# Patient Record
Sex: Male | Born: 1950 | Race: White | Hispanic: No | Marital: Married | Smoking: Former smoker
Health system: Southern US, Community
[De-identification: ages and names within clinical notes are randomized; demographics above are authoritative.]

## PROBLEM LIST (undated history)

## (undated) DIAGNOSIS — I509 Heart failure, unspecified: Secondary | ICD-10-CM

## (undated) DIAGNOSIS — J9610 Chronic respiratory failure, unspecified whether with hypoxia or hypercapnia: Secondary | ICD-10-CM

## (undated) DIAGNOSIS — I272 Pulmonary hypertension, unspecified: Secondary | ICD-10-CM

## (undated) DIAGNOSIS — R0602 Shortness of breath: Secondary | ICD-10-CM

## (undated) DIAGNOSIS — R51 Headache: Secondary | ICD-10-CM

## (undated) DIAGNOSIS — K648 Other hemorrhoids: Secondary | ICD-10-CM

## (undated) DIAGNOSIS — J4489 Other specified chronic obstructive pulmonary disease: Secondary | ICD-10-CM

## (undated) DIAGNOSIS — C801 Malignant (primary) neoplasm, unspecified: Secondary | ICD-10-CM

## (undated) DIAGNOSIS — L408 Other psoriasis: Secondary | ICD-10-CM

## (undated) DIAGNOSIS — I739 Peripheral vascular disease, unspecified: Secondary | ICD-10-CM

## (undated) DIAGNOSIS — R0902 Hypoxemia: Secondary | ICD-10-CM

## (undated) DIAGNOSIS — J449 Chronic obstructive pulmonary disease, unspecified: Secondary | ICD-10-CM

## (undated) DIAGNOSIS — J8410 Pulmonary fibrosis, unspecified: Secondary | ICD-10-CM

## (undated) DIAGNOSIS — D696 Thrombocytopenia, unspecified: Secondary | ICD-10-CM

## (undated) DIAGNOSIS — K5790 Diverticulosis of intestine, part unspecified, without perforation or abscess without bleeding: Secondary | ICD-10-CM

## (undated) DIAGNOSIS — I2510 Atherosclerotic heart disease of native coronary artery without angina pectoris: Secondary | ICD-10-CM

## (undated) DIAGNOSIS — E785 Hyperlipidemia, unspecified: Secondary | ICD-10-CM

## (undated) DIAGNOSIS — K631 Perforation of intestine (nontraumatic): Secondary | ICD-10-CM

## (undated) DIAGNOSIS — I279 Pulmonary heart disease, unspecified: Secondary | ICD-10-CM

## (undated) DIAGNOSIS — I50812 Chronic right heart failure: Secondary | ICD-10-CM

## (undated) DIAGNOSIS — Z86718 Personal history of other venous thrombosis and embolism: Secondary | ICD-10-CM

## (undated) DIAGNOSIS — M069 Rheumatoid arthritis, unspecified: Secondary | ICD-10-CM

## (undated) HISTORY — DX: Chronic right heart failure: I50.812

## (undated) HISTORY — DX: Other hemorrhoids: K64.8

## (undated) HISTORY — DX: Rheumatoid arthritis, unspecified: M06.9

## (undated) HISTORY — DX: Diverticulosis of intestine, part unspecified, without perforation or abscess without bleeding: K57.90

## (undated) HISTORY — DX: Chronic respiratory failure, unspecified whether with hypoxia or hypercapnia: J96.10

## (undated) HISTORY — DX: Hypoxemia: R09.02

## (undated) HISTORY — DX: Personal history of other venous thrombosis and embolism: Z86.718

## (undated) HISTORY — DX: Thrombocytopenia, unspecified: D69.6

## (undated) HISTORY — DX: Hyperlipidemia, unspecified: E78.5

## (undated) HISTORY — DX: Peripheral vascular disease, unspecified: I73.9

## (undated) HISTORY — DX: Pulmonary heart disease, unspecified: I27.9

## (undated) HISTORY — PX: BACK SURGERY: SHX140

## (undated) HISTORY — DX: Atherosclerotic heart disease of native coronary artery without angina pectoris: I25.10

## (undated) HISTORY — DX: Other psoriasis: L40.8

## (undated) HISTORY — DX: Other specified chronic obstructive pulmonary disease: J44.89

## (undated) HISTORY — DX: Chronic obstructive pulmonary disease, unspecified: J44.9

## (undated) HISTORY — DX: Pulmonary hypertension, unspecified: I27.20

## (undated) HISTORY — PX: TONSILLECTOMY: SUR1361

## (undated) HISTORY — DX: Perforation of intestine (nontraumatic): K63.1

## (undated) HISTORY — DX: Pulmonary fibrosis, unspecified: J84.10

---

## 1997-08-25 ENCOUNTER — Emergency Department (HOSPITAL_COMMUNITY): Admission: RE | Admit: 1997-08-25 | Discharge: 1997-08-25 | Payer: Self-pay | Admitting: Family Medicine

## 1997-09-18 ENCOUNTER — Ambulatory Visit (HOSPITAL_COMMUNITY): Admission: RE | Admit: 1997-09-18 | Discharge: 1997-09-18 | Payer: Self-pay | Admitting: *Deleted

## 1997-11-08 ENCOUNTER — Observation Stay (HOSPITAL_COMMUNITY): Admission: RE | Admit: 1997-11-08 | Discharge: 1997-11-08 | Payer: Self-pay | Admitting: Neurosurgery

## 1998-05-15 ENCOUNTER — Ambulatory Visit (HOSPITAL_COMMUNITY): Admission: RE | Admit: 1998-05-15 | Discharge: 1998-05-15 | Payer: Self-pay | Admitting: *Deleted

## 1998-05-21 ENCOUNTER — Ambulatory Visit (HOSPITAL_COMMUNITY): Admission: RE | Admit: 1998-05-21 | Discharge: 1998-05-21 | Payer: Self-pay | Admitting: Neurosurgery

## 2000-06-07 ENCOUNTER — Inpatient Hospital Stay (HOSPITAL_COMMUNITY): Admission: EM | Admit: 2000-06-07 | Discharge: 2000-06-10 | Payer: Self-pay | Admitting: *Deleted

## 2000-06-09 ENCOUNTER — Encounter: Payer: Self-pay | Admitting: Internal Medicine

## 2001-04-20 HISTORY — PX: COLON SURGERY: SHX602

## 2001-10-18 ENCOUNTER — Encounter (INDEPENDENT_AMBULATORY_CARE_PROVIDER_SITE_OTHER): Payer: Self-pay | Admitting: Specialist

## 2001-10-18 ENCOUNTER — Encounter (INDEPENDENT_AMBULATORY_CARE_PROVIDER_SITE_OTHER): Payer: Self-pay | Admitting: *Deleted

## 2001-10-18 ENCOUNTER — Ambulatory Visit (HOSPITAL_COMMUNITY): Admission: RE | Admit: 2001-10-18 | Discharge: 2001-10-18 | Payer: Self-pay | Admitting: Internal Medicine

## 2001-10-18 ENCOUNTER — Encounter: Payer: Self-pay | Admitting: *Deleted

## 2001-10-18 DIAGNOSIS — K631 Perforation of intestine (nontraumatic): Secondary | ICD-10-CM

## 2001-10-18 HISTORY — DX: Perforation of intestine (nontraumatic): K63.1

## 2001-10-18 HISTORY — PX: OTHER SURGICAL HISTORY: SHX169

## 2001-10-19 ENCOUNTER — Inpatient Hospital Stay (HOSPITAL_COMMUNITY): Admission: EM | Admit: 2001-10-19 | Discharge: 2001-10-26 | Payer: Self-pay | Admitting: Emergency Medicine

## 2001-10-19 ENCOUNTER — Encounter: Payer: Self-pay | Admitting: *Deleted

## 2001-10-19 ENCOUNTER — Encounter: Payer: Self-pay | Admitting: General Surgery

## 2001-10-21 ENCOUNTER — Encounter: Payer: Self-pay | Admitting: Surgery

## 2001-10-22 ENCOUNTER — Encounter: Payer: Self-pay | Admitting: Surgery

## 2001-11-11 ENCOUNTER — Ambulatory Visit (HOSPITAL_COMMUNITY): Admission: RE | Admit: 2001-11-11 | Discharge: 2001-11-11 | Payer: Self-pay | Admitting: General Surgery

## 2001-11-11 ENCOUNTER — Encounter: Payer: Self-pay | Admitting: General Surgery

## 2002-01-16 ENCOUNTER — Ambulatory Visit (HOSPITAL_COMMUNITY): Admission: RE | Admit: 2002-01-16 | Discharge: 2002-01-16 | Payer: Self-pay | Admitting: Cardiology

## 2002-04-26 ENCOUNTER — Ambulatory Visit (HOSPITAL_COMMUNITY): Admission: RE | Admit: 2002-04-26 | Discharge: 2002-04-26 | Payer: Self-pay

## 2004-03-11 ENCOUNTER — Ambulatory Visit: Payer: Self-pay | Admitting: Critical Care Medicine

## 2004-05-09 ENCOUNTER — Ambulatory Visit: Payer: Self-pay | Admitting: Critical Care Medicine

## 2006-06-30 ENCOUNTER — Ambulatory Visit: Payer: Self-pay | Admitting: Vascular Surgery

## 2007-03-08 ENCOUNTER — Encounter: Admission: RE | Admit: 2007-03-08 | Discharge: 2007-03-08 | Payer: Self-pay | Admitting: Dermatology

## 2007-08-25 ENCOUNTER — Encounter: Payer: Self-pay | Admitting: Emergency Medicine

## 2008-01-17 ENCOUNTER — Ambulatory Visit: Payer: Self-pay | Admitting: Vascular Surgery

## 2008-03-01 ENCOUNTER — Encounter: Payer: Self-pay | Admitting: Emergency Medicine

## 2008-08-30 ENCOUNTER — Encounter: Payer: Self-pay | Admitting: Emergency Medicine

## 2008-09-05 ENCOUNTER — Encounter: Admission: RE | Admit: 2008-09-05 | Discharge: 2008-09-05 | Payer: Self-pay | Admitting: Orthopedic Surgery

## 2008-09-06 ENCOUNTER — Encounter (INDEPENDENT_AMBULATORY_CARE_PROVIDER_SITE_OTHER): Payer: Self-pay | Admitting: Orthopedic Surgery

## 2008-09-06 ENCOUNTER — Ambulatory Visit (HOSPITAL_BASED_OUTPATIENT_CLINIC_OR_DEPARTMENT_OTHER): Admission: RE | Admit: 2008-09-06 | Discharge: 2008-09-06 | Payer: Self-pay | Admitting: Orthopedic Surgery

## 2009-07-12 ENCOUNTER — Ambulatory Visit: Payer: Self-pay | Admitting: Vascular Surgery

## 2009-09-25 ENCOUNTER — Encounter: Payer: Self-pay | Admitting: Emergency Medicine

## 2009-09-27 ENCOUNTER — Encounter: Admission: RE | Admit: 2009-09-27 | Discharge: 2009-09-27 | Payer: Self-pay | Admitting: Cardiology

## 2009-09-27 ENCOUNTER — Encounter: Payer: Self-pay | Admitting: Emergency Medicine

## 2009-10-04 ENCOUNTER — Ambulatory Visit (HOSPITAL_COMMUNITY): Admission: RE | Admit: 2009-10-04 | Discharge: 2009-10-04 | Payer: Self-pay | Admitting: Cardiology

## 2009-10-08 ENCOUNTER — Ambulatory Visit: Payer: Self-pay | Admitting: Pulmonary Disease

## 2009-10-09 ENCOUNTER — Encounter (INDEPENDENT_AMBULATORY_CARE_PROVIDER_SITE_OTHER): Payer: Self-pay | Admitting: Cardiology

## 2009-10-09 ENCOUNTER — Inpatient Hospital Stay (HOSPITAL_COMMUNITY): Admission: EM | Admit: 2009-10-09 | Discharge: 2009-10-14 | Payer: Self-pay | Admitting: Emergency Medicine

## 2009-10-10 ENCOUNTER — Encounter (INDEPENDENT_AMBULATORY_CARE_PROVIDER_SITE_OTHER): Payer: Self-pay | Admitting: Cardiology

## 2009-10-10 ENCOUNTER — Ambulatory Visit: Payer: Self-pay | Admitting: Vascular Surgery

## 2009-10-15 ENCOUNTER — Ambulatory Visit: Payer: Self-pay | Admitting: Oncology

## 2009-10-22 ENCOUNTER — Encounter: Payer: Self-pay | Admitting: Emergency Medicine

## 2009-10-29 DIAGNOSIS — D696 Thrombocytopenia, unspecified: Secondary | ICD-10-CM | POA: Insufficient documentation

## 2009-10-29 DIAGNOSIS — R0902 Hypoxemia: Secondary | ICD-10-CM | POA: Insufficient documentation

## 2009-10-29 DIAGNOSIS — E785 Hyperlipidemia, unspecified: Secondary | ICD-10-CM

## 2009-10-29 DIAGNOSIS — I739 Peripheral vascular disease, unspecified: Secondary | ICD-10-CM

## 2009-10-29 DIAGNOSIS — L408 Other psoriasis: Secondary | ICD-10-CM

## 2009-10-29 DIAGNOSIS — M069 Rheumatoid arthritis, unspecified: Secondary | ICD-10-CM | POA: Insufficient documentation

## 2009-10-29 DIAGNOSIS — J9610 Chronic respiratory failure, unspecified whether with hypoxia or hypercapnia: Secondary | ICD-10-CM

## 2009-10-29 DIAGNOSIS — J8410 Pulmonary fibrosis, unspecified: Secondary | ICD-10-CM | POA: Insufficient documentation

## 2009-10-30 ENCOUNTER — Ambulatory Visit: Payer: Self-pay | Admitting: Emergency Medicine

## 2009-11-01 ENCOUNTER — Encounter: Payer: Self-pay | Admitting: Emergency Medicine

## 2009-11-04 LAB — CONVERTED CEMR LAB
ALT: 60 units/L — ABNORMAL HIGH (ref 0–53)
AST: 42 units/L — ABNORMAL HIGH (ref 0–37)
Albumin: 4 g/dL (ref 3.5–5.2)
Bilirubin, Direct: 0.2 mg/dL (ref 0.0–0.3)
Total Protein: 7.5 g/dL (ref 6.0–8.3)

## 2009-11-07 ENCOUNTER — Telehealth (INDEPENDENT_AMBULATORY_CARE_PROVIDER_SITE_OTHER): Payer: Self-pay | Admitting: *Deleted

## 2009-11-13 ENCOUNTER — Ambulatory Visit: Payer: Self-pay | Admitting: Emergency Medicine

## 2009-11-13 LAB — CBC WITH DIFFERENTIAL/PLATELET
BASO%: 0.2 % (ref 0.0–2.0)
Basophils Absolute: 0 10*3/uL (ref 0.0–0.1)
EOS%: 5.8 % (ref 0.0–7.0)
HCT: 46.4 % (ref 38.4–49.9)
LYMPH%: 10.2 % — ABNORMAL LOW (ref 14.0–49.0)
MCH: 31.3 pg (ref 27.2–33.4)
MCHC: 33.9 g/dL (ref 32.0–36.0)
MCV: 92.3 fL (ref 79.3–98.0)
MONO#: 0.6 10*3/uL (ref 0.1–0.9)
NEUT#: 10 10*3/uL — ABNORMAL HIGH (ref 1.5–6.5)
NEUT%: 79.2 % — ABNORMAL HIGH (ref 39.0–75.0)
RBC: 5.03 10*6/uL (ref 4.20–5.82)
RDW: 15.8 % — ABNORMAL HIGH (ref 11.0–14.6)
WBC: 12.7 10*3/uL — ABNORMAL HIGH (ref 4.0–10.3)

## 2009-11-13 LAB — MORPHOLOGY: PLT EST: ADEQUATE

## 2009-11-13 LAB — CHCC SMEAR

## 2009-11-14 ENCOUNTER — Ambulatory Visit: Payer: Self-pay | Admitting: Oncology

## 2009-11-14 ENCOUNTER — Telehealth (INDEPENDENT_AMBULATORY_CARE_PROVIDER_SITE_OTHER): Payer: Self-pay | Admitting: *Deleted

## 2009-11-15 ENCOUNTER — Encounter: Payer: Self-pay | Admitting: Emergency Medicine

## 2009-11-15 LAB — COMPREHENSIVE METABOLIC PANEL
ALT: 28 U/L (ref 0–53)
Alkaline Phosphatase: 79 U/L (ref 39–117)
CO2: 23 mEq/L (ref 19–32)
Chloride: 101 mEq/L (ref 96–112)
Creatinine, Ser: 0.9 mg/dL (ref 0.40–1.50)
Potassium: 4.1 mEq/L (ref 3.5–5.3)

## 2009-11-15 LAB — PROTEIN ELECTROPHORESIS, SERUM
Alpha-2-Globulin: 13.3 % — ABNORMAL HIGH (ref 7.1–11.8)
Beta Globulin: 5.4 % (ref 4.7–7.2)
Gamma Globulin: 15.2 % (ref 11.1–18.8)

## 2009-11-15 LAB — CONVERTED CEMR LAB
ALT: 30 units/L (ref 0–53)
AST: 32 units/L (ref 0–37)
Albumin: 3.5 g/dL (ref 3.5–5.2)
Alkaline Phosphatase: 82 units/L (ref 39–117)
Bilirubin, Direct: 0.1 mg/dL (ref 0.0–0.3)
Total Bilirubin: 0.8 mg/dL (ref 0.3–1.2)

## 2009-11-15 LAB — KAPPA/LAMBDA LIGHT CHAINS: Kappa:Lambda Ratio: 0.53 (ref 0.26–1.65)

## 2009-11-15 LAB — IRON AND TIBC: Iron: 164 ug/dL (ref 42–165)

## 2009-11-15 LAB — FERRITIN: Ferritin: 644 ng/mL — ABNORMAL HIGH (ref 22–322)

## 2009-11-19 ENCOUNTER — Ambulatory Visit: Payer: Self-pay | Admitting: Emergency Medicine

## 2009-11-19 LAB — HEMOCHROMATOSIS DNA-PCR(C282Y,H63D)

## 2009-11-20 ENCOUNTER — Telehealth (INDEPENDENT_AMBULATORY_CARE_PROVIDER_SITE_OTHER): Payer: Self-pay | Admitting: *Deleted

## 2009-11-20 ENCOUNTER — Telehealth: Payer: Self-pay | Admitting: Emergency Medicine

## 2009-11-26 ENCOUNTER — Telehealth: Payer: Self-pay | Admitting: Emergency Medicine

## 2009-11-29 ENCOUNTER — Encounter: Payer: Self-pay | Admitting: Emergency Medicine

## 2009-12-12 ENCOUNTER — Ambulatory Visit: Payer: Self-pay | Admitting: Emergency Medicine

## 2009-12-25 LAB — CONVERTED CEMR LAB
ALT: 23 units/L (ref 0–53)
AST: 28 units/L (ref 0–37)
Alkaline Phosphatase: 60 units/L (ref 39–117)
Total Bilirubin: 0.6 mg/dL (ref 0.3–1.2)
Total Protein: 6.6 g/dL (ref 6.0–8.3)

## 2010-01-08 ENCOUNTER — Encounter: Payer: Self-pay | Admitting: Emergency Medicine

## 2010-01-13 ENCOUNTER — Ambulatory Visit: Payer: Self-pay | Admitting: Emergency Medicine

## 2010-01-14 LAB — CONVERTED CEMR LAB
ALT: 25 units/L (ref 0–53)
Total Protein: 6.8 g/dL (ref 6.0–8.3)

## 2010-02-05 ENCOUNTER — Ambulatory Visit: Payer: Self-pay | Admitting: Emergency Medicine

## 2010-02-13 ENCOUNTER — Ambulatory Visit: Payer: Self-pay | Admitting: Emergency Medicine

## 2010-02-13 ENCOUNTER — Telehealth: Payer: Self-pay | Admitting: Emergency Medicine

## 2010-02-13 LAB — CONVERTED CEMR LAB
Alkaline Phosphatase: 51 units/L (ref 39–117)
Total Bilirubin: 0.8 mg/dL (ref 0.3–1.2)

## 2010-02-17 ENCOUNTER — Ambulatory Visit: Payer: Self-pay | Admitting: Emergency Medicine

## 2010-02-20 ENCOUNTER — Ambulatory Visit: Payer: Self-pay | Admitting: Emergency Medicine

## 2010-02-21 LAB — CONVERTED CEMR LAB
CO2: 32 meq/L (ref 19–32)
Creatinine, Ser: 1 mg/dL (ref 0.4–1.5)
Potassium: 4.3 meq/L (ref 3.5–5.1)

## 2010-03-07 ENCOUNTER — Ambulatory Visit: Payer: Self-pay | Admitting: Emergency Medicine

## 2010-03-07 LAB — CONVERTED CEMR LAB
ALT: 26 units/L (ref 0–53)
AST: 32 units/L (ref 0–37)
Albumin: 4 g/dL (ref 3.5–5.2)
Total Bilirubin: 0.9 mg/dL (ref 0.3–1.2)

## 2010-03-19 ENCOUNTER — Ambulatory Visit: Payer: Self-pay | Admitting: Vascular Surgery

## 2010-03-21 ENCOUNTER — Encounter: Payer: Self-pay | Admitting: Emergency Medicine

## 2010-04-07 ENCOUNTER — Ambulatory Visit: Payer: Self-pay | Admitting: Emergency Medicine

## 2010-04-11 LAB — CONVERTED CEMR LAB: Total Bilirubin: 0.8 mg/dL (ref 0.3–1.2)

## 2010-04-15 ENCOUNTER — Encounter: Payer: Self-pay | Admitting: Emergency Medicine

## 2010-04-16 ENCOUNTER — Ambulatory Visit: Payer: Self-pay | Admitting: Emergency Medicine

## 2010-04-16 ENCOUNTER — Ambulatory Visit
Admission: RE | Admit: 2010-04-16 | Discharge: 2010-04-16 | Payer: Self-pay | Source: Home / Self Care | Attending: Emergency Medicine | Admitting: Emergency Medicine

## 2010-04-16 DIAGNOSIS — R0609 Other forms of dyspnea: Secondary | ICD-10-CM | POA: Insufficient documentation

## 2010-04-16 DIAGNOSIS — R0989 Other specified symptoms and signs involving the circulatory and respiratory systems: Secondary | ICD-10-CM

## 2010-04-18 ENCOUNTER — Encounter: Payer: Self-pay | Admitting: Emergency Medicine

## 2010-04-20 DIAGNOSIS — I2510 Atherosclerotic heart disease of native coronary artery without angina pectoris: Secondary | ICD-10-CM

## 2010-04-20 HISTORY — DX: Atherosclerotic heart disease of native coronary artery without angina pectoris: I25.10

## 2010-04-20 HISTORY — PX: OTHER SURGICAL HISTORY: SHX169

## 2010-04-22 ENCOUNTER — Telehealth (INDEPENDENT_AMBULATORY_CARE_PROVIDER_SITE_OTHER): Payer: Self-pay | Admitting: *Deleted

## 2010-05-06 ENCOUNTER — Other Ambulatory Visit: Payer: Self-pay | Admitting: Emergency Medicine

## 2010-05-06 ENCOUNTER — Ambulatory Visit
Admission: RE | Admit: 2010-05-06 | Discharge: 2010-05-06 | Payer: Self-pay | Source: Home / Self Care | Attending: Emergency Medicine | Admitting: Emergency Medicine

## 2010-05-06 LAB — HEPATIC FUNCTION PANEL
ALT: 24 U/L (ref 0–53)
AST: 25 U/L (ref 0–37)
Albumin: 3.9 g/dL (ref 3.5–5.2)
Alkaline Phosphatase: 42 U/L (ref 39–117)
Bilirubin, Direct: 0.2 mg/dL (ref 0.0–0.3)
Total Bilirubin: 0.9 mg/dL (ref 0.3–1.2)
Total Protein: 6.7 g/dL (ref 6.0–8.3)

## 2010-05-08 ENCOUNTER — Telehealth (INDEPENDENT_AMBULATORY_CARE_PROVIDER_SITE_OTHER): Payer: Self-pay | Admitting: *Deleted

## 2010-05-12 ENCOUNTER — Encounter: Payer: Self-pay | Admitting: Emergency Medicine

## 2010-05-16 ENCOUNTER — Encounter: Payer: Self-pay | Admitting: Emergency Medicine

## 2010-05-19 ENCOUNTER — Ambulatory Visit
Admission: RE | Admit: 2010-05-19 | Discharge: 2010-05-19 | Payer: Self-pay | Source: Home / Self Care | Attending: Emergency Medicine | Admitting: Emergency Medicine

## 2010-05-22 NOTE — Progress Notes (Signed)
Summary: refills  Phone Note Call from Patient Call back at Home Phone [phone]   Caller: Patient Call For: byrum Reason for Call: Refill Medication Summary of Call: Requests refills on klor-con 20 MEQ and lasix 14m [facility]. Initial call taken by: JNetta Neat  May 08, 2010 12:55 PM  Follow-up for Phone Call        Rx refills sent to pharm.  Pt aware.  Follow-up by: LTilden Dome  May 08, 2010 3:00 PM    Prescriptions: KLOR-CON M20 20 MEQ CR-TABS (POTASSIUM CHLORIDE CRYS CR) 2 by mouth daily  #180 x 1   Entered by:   LTilden Dome  Authorized by:   RCollene GobbleMD   Signed by:   LTilden Domeon 05/08/2010   Method used:   Faxed to ...       [facility]Probation officer       [address] [place]  [address]      Ph: [phone]      Fax: [phone]  RxID::   [id number] 40 MG TABS (FUROSEMIDE) 1 by mouth two times a day  #180 x 1   Entered by:   LTilden Dome  Authorized by:   RCollene GobbleMD   Signed by:   LTilden Domeon 05/08/2010   Method used:   Faxed to ...       [facility] (Probation officer       [address] [place]  [address]      Ph: [phone]      Fax: [phone]  RxID:   [id number]

## 2010-05-22 NOTE — Progress Notes (Signed)
Summary: Myrtis Hopping PA - waiting approval/denial  Phone Note From Pharmacy Call back at [phone] EX [phone]   Caller: [facility] Call For: BYRUM  Summary of Call:  [place]. Initial call taken by: Gustavus Bryant,  November 20, 2009 9:28 AM  Follow-up for Phone Call        called Freda Munro back and was informed she has faxed over a request regarding getting a prior auth for pt's Letaris.  Pt states she has faxed this 3 times since [phone] and faxed again today.  Freda Munro states we can call [phone] to start the PA process.  Pt's ID # is [id number].  Will forward message to Keane Scrape J since she is doing prior auths this afternoon to see if she has seen any paperwork on this pt.  Jinny Blossom Reynolds LPN  November 20, 3252 2:05 PM   Additional Follow-up for Phone Call Additional follow up Details #1::        no PA's for this patient.  will speak with Cecille Rubin regarding this - perhaps it is already in RB's lookat. Parke Poisson CNA/MA  November 20, 2009 4:48 PM   PA form found in RB's lookat, already filled out dated yesterday.  faxed back to [place] script.  will wait for approval/denial.  [facility] for Freda Munro to inform her that this has been faxed back to the number on the form. Parke Poisson CNA/MA  November 20, 2009 4:58 PM     Additional Follow-up for Phone Call Additional follow up Details #2::    Freda Munro from [employer] to verify if pt has tried and failed Tracleer, Revatio, or Engineer, site. Also will need copy of right heart cath, and 6 min walk. Caller did not want pt's PA to be denied and sent for appeal process due to not trying the above medications first. Please advise if you would like to continue with PA or try pt on different medication. Thanks. Iran Planas CMA  November 27, 2009 11:17 AM   He has never been tried on Tracleer, which means that they will probably reject this. I could do appeal since letaris is only once daily and he would  not need as frequent LFT's. If [facility] is going to refuse, then I would have to order Tracleer instead, go through the entire process again with this med. Will either need to get this through appeal process or change to bosentan 62.79m two times a day RCollene GobbleMD  November 27, 2009 12:50 PM     Left message for SFreda Munrothat pt has had Right Heart Cath . Will call with update asap; if any questions or concerns please call and speak with LFrancesca Jewett(nurse for RLarimer.KClayborne DanaCMA  November 27, 2009 3:27 PM   Additional Follow-up for Phone Call Additional follow up Details #3:: Details for Additional Follow-up Action Taken: Spoke with SFreda Munroand I am faxing the right heart cath and echo for clinical review. she is aware pt has not tried or failed any other PAH meds. She will call if additional info is needed. Reports faxed to  [place]@ [phone] attn: SFreda Munro Wil await results.   LFrancesca JewettCTowne Centre Surgery Center LLC November 27, 2009 4:13 PM  received approval from [facility] stating that pt's letaris has been approved from 11-29-2009 thru 11-29-2010.  approval letter [facility] to [facility]and message forwarded back to her. JParke PoissonCNA/MA  November 29, 2009 3:21 PM  called [facility] and verified that approval for letaris has been done and they are aware and have this info Ova Freshwater  November 29, 2009 3:38 PM  Additional Follow-up by: Ova Freshwater,  November 29, 2009 3:38 PM

## 2010-05-22 NOTE — Progress Notes (Signed)
Summary: edema > increased lasix to 73m 2 tabs daily  Phone Note Call from Patient Call back at Home Phone ([phone]  Caller: Patient Call For: byrum Summary of Call: pt was seen recently. says dr byrum increased pt's lasix. pt states he still has edema in his legs "late afternoons". pls advise. [facility] [address] ([facility] dr [facility]rum calls in [facility]. note: pt just left our bldg.  Initial call taken by: KCooper Render CNA,  February 13, 2010 12:31 PM  Follow-up for Phone Call        LVip Surg Asc LLCLTilden Dome February 13, 2010 12:43 PM  Spoke with pt.  He was seen on 02/05/10- had some swelling in legs and was advised that he needed to inrease his lasix to 40 mg to two times a day x 3 days, and then resume med once daily.  He states that this only helped some and now legs are swollen just like they were before.  Denies any other complaints.  Will forward to doc of the day.  Pls advise thanks Follow-up by: LTilden Dome  February 13, 2010 3:57 PM  Additional Follow-up for Phone Call Additional follow up Details #1::        per CDY: ok to take lasix 412m2tabs daily until he can discuss w/ RB.  called spoke with patient, advised him of CDYs recs as stated above.  pt verbalized his understanding.  will forward msg to RBLyndhurstor review/further recs.  pt aware RB not in office until tomorrow afternoon. JeParke PoissonNA/MA  February 13, 2010 5:02 PM     Additional Follow-up for Phone Call Additional follow up Details #2::    Spoke w him today. he started lasix 4077mwo times a day yesterday. Asked him to continue this regimen and get BMP next week in order to confirm no effects on renal fxn. Will adjust further based on lab results. RobCollene Gobble  February 14, 2010 5:42 PM   order placed for BMP. JenRandolph BingA  February 17, 2010 9:06 AM

## 2010-05-22 NOTE — Miscellaneous (Signed)
Summary: Orders Update pft charges  Clinical Lists Changes  Orders: Added new Service order of Carbon Monoxide diffusing w/capacity (94720) - Signed Added new Service order of Lung Volumes (94240) - Signed Added new Service order of Spirometry (Pre & Post) (94060) - Signed 

## 2010-05-22 NOTE — Progress Notes (Signed)
Summary: prior auth correction  [id number]  Phone Note From Pharmacy Call back at [phone]   Caller: sheila[facility] Call For: [name]  Summary of Call: States some items on the prior autho needs corrected. Initial call taken by: Netta Neat,  November 26, 2009 3:14 PM  Follow-up for Phone Call        [facility] for [name] from [place].  Derek Blossom Reynolds LPN  November 27, 4799 3:22 PM   Additional Follow-up for Phone Call Additional follow up Details #1::        See phone note from 11/20/2009. will close this phone note and finish in previous. Iran Planas CMA  November 27, 2009 11:14 AM

## 2010-05-22 NOTE — Letter (Signed)
Summary: [facility]   Imported By: Phillis Knack 12/04/2009 11:56:22  _____________________________________________________________________  External Attachment:    Type:   Image     Comment:   External Document

## 2010-05-22 NOTE — Assessment & Plan Note (Signed)
Summary: 6 min walk  Nurse Visit   Vital Signs:  Patient profile:   60 year old male Pulse rate:   89 / minute BP sitting:   114 / 66  O2 Flow:  4  Allergies: No Known Drug Allergies  Orders Added: 1)  Pulmonary Stress (6 min walk) [94620]   Six Minute Walk Test Medications taken before test(dose and time): lasix, prednisone, pantoprazole, spironolactone, klor con, pletal, coumadin, letaris, mucinex, tylenol @ 0930 Supplemental oxygen during the test: Yes Flow: 4 Type: Continuous  Lap counter(place a tick mark inside a square for each lap completed) lap 1 complete  lap 2 complete   lap 3 complete    Baseline  BP sitting: 114/ 66 Heart rate: 89 Dyspnea ( Borg scale) 0 Fatigue (Borg scale) 0.5 SPO2 94 4L  End Of Test   Heart rate: 127 Dyspnea ( Borg scale) 9 Fatigue (Borg scale) 0.5 SPO2 76 4L  2 Minutes post  BP sitting: 122/ 64 Heart rate: 89 SPO2 94 6L  Stopped or paused before six minutes? Yes Reason: d/t DOE, checked pt's o2 sats at 2:18sec.  sats = 76% on 4L cont, HR = 127.  had patient to sit down.  increased o2 to 6L cont and encouraged deep breathing.  at 44mnutes left, pt's o2 had increased 77% 6L.    Interpretation: Number of laps  3 X 48 meters =   144 meters+ final partial lap: 24 meters =    168 meters   Total distance walked in six minutes: 168 meters  Tech ID: JParke PoissonCNA/MA (April 16, 2010 2:38 PM) Tech Comments pt was unable to complete test d/t DOE and sats decreasing down to 76% 4L.  had increased pt's o2 liter flow to 6L and encouraged deep breathing.  at 071mutes left in the test, pt's sats had recovered to 77%-80% on 6L.  at 2 minutes post, pt's sats had recovered to 94% 6L.  discussed w/ RB on whether to continue walking pt on increased liter flow, or to leave test as is.  per RB, okay to leave test/results "as is."

## 2010-05-22 NOTE — Medication Information (Signed)
Summary: [facility]   Imported By: Bubba Hales 04/30/2010 10:33:03  _____________________________________________________________________  External Attachment:    Type:   Image     Comment:   External Document

## 2010-05-22 NOTE — Assessment & Plan Note (Signed)
Summary: PAH, ILD, COPD, hypoxemia   Visit Type:  Follow-up Primary Provider/Referring Provider:  Dr. Deland Pretty  CC:  PAH...COPD.Marland KitchenMarland KitchenCXR and 6MW today.Marland KitchenMarland KitchenEcho done 03/25/2009.Marland KitchenMarland Kitchenpt c/o incr SOB w/ exertion.  History of Present Illness: 60 yo man, former smoker with COPD, hx psoriatic arthritis and associated ILD, profound hypoxemia and associated Pulm HTN (PAP 57/26 mean 37, PAOP 14/11 mean 7 by cath 10/04/09. 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 Admitted 6/21-27 with resp failure and decompensated R heart failure. He was diuresed, treated with corticosteroids and started on bronchodilators for his COPD. O2 was titrated to 5L/min at rest, he is leaving it on 5 with exertion.   ROV 01/13/10 -- 59yo, Hx psoriatic arthritis, ILD and R heart failure, hypoxemia, PAH, on chronic pred 66m once daily. Also hx COPD. Started Letaris at last visit after LFT's normalized. He describes today a slow decline in exertional tolerance since hosp 6/21-27. Has been increasing oximizer to 4L/min with exertion. Having thick sputum, hard to get up, white/tan. Worst in the evening. No wheezing. No CP. Edema has been stable, he gets some at the end of the day, then resolves by am. He doesn't notice any difference in his breathing on the letaris.   ROV 02/05/10 -- 59yo, PAH with Hx psoriatic arthritis, ILD and R heart failure, hypoxemia, on chronic pred 153monce daily. Also hx COPD.  On letaris since September '11. Since last time breathing may be a bit better. He has good days and bad days. Rarely uses ventolin. No HA, no syncope or near syncope. LE edema has been well controlled and stable. Was desaturated while walking today on presentation. Has been gaining some wt, aldactone was decreased by Dr SoElisabeth Cara month ago. PFTs done today.   ROV 04/16/10 -- Hx as above, follows up after TTE and 6 min walk. Returns feeling a bit more SOB w exertion. 6 min walk distance = 16865mssoc with significant desat to 75% (after 2  min). Estimated RVSP 36m23mup from 59 in 09/2009). Has been on letaris since September. Edema seems better on lasix 40mg19m times a day. Clearly both TTE and walk distance are worse.   Current Medications (verified): 1)  Furosemide 40 Mg Tabs (Furosemide) .... 1Marland KitchenBy Mouth Two Times A Day 2)  Multivitamins  Tabs (Multiple Vitamin) .... Take 1 Tablet By Mouth Once A Day 3)  Prednisone 10 Mg Tabs (Prednisone) .... 1Marland KitchenBy Mouth Daily 4)  Protonix 40 Mg Tbec (Pantoprazole Sodium) .... Take 1 Tablet By Mouth Once A Day 5)  Spironolactone 25 Mg Tabs (Spironolactone) .... Take 1/2 Tablet By Mouth Two Times A Day 6)  Spiriva Handihaler 18 Mcg  Caps (Tiotropium Bromide Monohydrate) .... Two Puffs in Handihaler Daily 7)  Pletal 100 Mg Tabs (Cilostazol) .... Take 1 Tablet By Mouth Two Times A Day 8)  Humira 40 Mg/0.8ml K52m(Adalimumab) .... Every 2 Weeks 9)  Thiamine Hcl 100 Mg Tabs (Thiamine Hcl) .... 1 Marland Kitcheny Mouth Daily 10)  Klor-Con M20 20 Meq Cr-Tabs (Potassium Chloride Crys Cr) .... 2 By Mouth Daily 11)  Coumadin 3 Mg Tabs (Warfarin Sodium) .... As Directed 12)  Folic Acid 1 Mg Tabs (Folic Acid) .... 1 Marland Kitcheny Mouth Daily 13)  Letairis 5 Mg Tabs (Ambrisentan) .... 1 Marland Kitcheny Mouth Daily 14)  Tylenol Arthritis Pain 650 Mg Cr-Tabs (Acetaminophen) .... 2 By Mouth Two Times A Day 15)  Ventolin Hfa 108 (90 Base) Mcg/act Aers (Albuterol Sulfate) .... 2 Puffs  Q4h As Needed Sob 16)  Simvastatin 20 Mg Tabs (Simvastatin) .Marland Kitchen.. 1 Once Daily 17)  Mucinex 600 Mg Xr12h-Tab (Guaifenesin) .Marland Kitchen.. 1 Once Daily  Allergies (verified): No Known Drug Allergies  Vital Signs:  Patient profile:   60 year old male Height:      67 inches (170.18 cm) Weight:      178.38 pounds (81.08 kg) BMI:     28.04 O2 Sat:      95 % on 4 L/min Temp:     97.8 degrees F (36.56 degrees C) oral Pulse rate:   88 / minute BP sitting:   120 / 78  (right arm) Cuff size:   regular  Vitals Entered By: Francesca Jewett CMA (April 16, 2010 3:03 PM)  O2 Sat  at Rest %:  95 O2 Flow:  4 L/min CC: PAH...COPD.Marland KitchenMarland KitchenCXR and 6MW today.Marland KitchenMarland KitchenEcho done 03/25/2009.Marland KitchenMarland Kitchenpt c/o incr SOB w/ exertion Comments Medications reviewed with patient Francesca Jewett CMA  April 16, 2010 3:03 PM   Physical Exam  General:  normal appearance and healthy appearing, hyperpigmented skin Head:  normocephalic and atraumatic Eyes:  conjunctiva and sclera clear Nose:  scar on tip of nose, no other lesions Mouth:  no deformity or lesions Neck:  no masses, thyromegaly, or abnormal cervical nodes Lungs:  bilateral basilar insp crackles, L>R, no wheezes Heart:  regular, S1, S2 + S4 Abdomen:  not examined Extremities:  1+ LE edema Neurologic:  non-focal Skin:  some mild psoriatic changes both calves Psych:  alert and cooperative; normal mood and affect; normal attention span and concentration   Impression & Recommendations:  Problem # 1:  PULMONARY HYPERTENSION, 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 1 by mouth two times a day 2)  Klor-con M20 20 Meq Cr-tabs (Potassium chloride crys cr) .... 2 by mouth daily  Patient Instructions: 1)  Continue your Spiriva 2)  Continue your oxygen 3)  Continue your Letaris. We will start the process to get you on Tyvaso through [facility] 4)  We will look at your insurance and find out the preferred centers to refer you for lung transplant evaluation 5)  Follow up with Dr Lamonte Sakai in 1 month   Immunization History:  Influenza Immunization History:    Influenza:  historical (01/18/2010)

## 2010-05-22 NOTE — Letter (Signed)
Summary: [facility]   Imported By: Bubba Hales 11/22/2009 13:12:14  _____________________________________________________________________  External Attachment:    Type:   Image     Comment:   External Document

## 2010-05-22 NOTE — Progress Notes (Signed)
Summary: labs  Phone Note Call from Patient Call back at Home Phone [phone]   Caller: Patient Call For: byrum Summary of Call: pt wants to speak to nurse re: "changing labs to be done". wants to move this to 8/25. Initial call taken by: Cooper Render, CNA,  November 20, 2009 12:05 PM  Follow-up for Phone Call        I advised pt it is okay for him to come in early. I will leave original appointment. Iran Planas CMA  November 20, 2009 12:27 PM

## 2010-05-22 NOTE — Assessment & Plan Note (Signed)
Summary: pulm HTN   Visit Type:  Hospital Follow-up Primary Provider/Referring Provider:  Dr. Deland Pretty  CC:  HFU. The patient has no complaints today.Marland Kitchen  History of Present Illness: 60 yo man, former smoker with COPD, hx psoriatic arthritis and associated ILD, profound hypoxemia and associated Pulm HTN (PAP 57/26 mean 37, PAOP 14/11 mean 7 by cath 10/04/09. 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 Admitted 6/21-27 with resp failure and decompensated R heart failure. He was diuresed, treated with corticosteroids and started on bronchodilators for his COPD. O2 was titrated to 5L/min at rest, he is leaving it on 5 with exetion. He presents now post-hosp to discuss his lung disease and his pulm HTN.   He tells me that his breathing is improved since hospitalization. He is limited by the short duration of his O2 tanks on high flows [facility]). Was started on Spiriva, seems to be tolerating well. His LE edema is markedly improved, Spironolactone added on d/c. Tapering Pred, currently on 13m once daily, his prior mainatance dose was 154monce daily. Psoriasis and rheumatoid disease under good control for the last 2 yrs. No snoring, no witnessed apneas.   Current Medications (verified): 1)  Furosemide 40 Mg Tabs (Furosemide) .... Take 1 Tablet By Mouth Once A Day 2)  Multivitamins  Tabs (Multiple Vitamin) .... Take 1 Tablet By Mouth Once A Day 3)  Prednisone 10 Mg Tabs (Prednisone) .... 205maily 4)  Protonix 40 Mg Tbec (Pantoprazole Sodium) .... Take 1 Tablet By Mouth Once A Day 5)  Spironolactone 25 Mg Tabs (Spironolactone) .... Take 1 Tablet By Mouth Two Times A Day 6)  Spiriva Handihaler 18 Mcg  Caps (Tiotropium Bromide Monohydrate) .... Two Puffs in Handihaler Daily 7)  Pletal 100 Mg Tabs (Cilostazol) .... Take 1 Tablet By Mouth Two Times A Day 8)  Humira 40 Mg/0.8ml70mt (Adalimumab) .... Every 2 Weeks 9)  Thiamine Hcl 100 Mg Tabs (Thiamine Hcl) .... Marland Kitchen By Mouth Daily 10)   Klor-Con M20 20 Meq Cr-Tabs (Potassium Chloride Crys Cr) .... Marland Kitchen By Mouth Daily 11)  Coumadin 3 Mg Tabs (Warfarin Sodium) .... As Directed 12)  Folic Acid 1 Mg Tabs (Folic Acid) .... Marland Kitchen By Mouth Daily  Allergies (verified): No Known Drug Allergies  Vital Signs:  Patient profile:   59 y12r old male Height:      67 inches (170.18 cm) Weight:      162 pounds (73.64 kg) BMI:     25.46 O2 Sat:      92 % on 5 L/min Temp:     98.3 degrees F (36.83 degrees C) oral Pulse rate:   96 / minute BP sitting:   120 / 68  (right arm) Cuff size:   regular  Vitals Entered By: LoriFrancesca Jewett (October 30, 2009 4:13 PM)  O2 Sat at Rest %:  92 O2 Flow:  5 L/min CC: HFU. The patient has no complaints today. Comments Medications reviewed. Daytime phone verified. LoriFrancesca Jewett  October 30, 2009 4:15 PM   Physical Exam  General:  normal appearance and healthy appearing, hyperpigmented skin Head:  normocephalic and atraumatic Eyes:  conjunctiva and sclera clear Nose:  scar on tip of nose, no other lesions Mouth:  no deformity or lesions Neck:  no masses, thyromegaly, or abnormal cervical nodes Lungs:  bilateral basilar insp crackles, L>R, no wheezes Heart:  regular, S1, S2 + S4 Abdomen:  not examined Extremities:  trace pretibial edema, markedly  improved from hospitalization Neurologic:  non-focal Skin:  some mild psoriatic changes both calves Psych:  alert and cooperative; normal mood and affect; normal attention span and concentration   Impression & Recommendations:  Problem # 1:  PULMONARY HYPERTENSION, 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) - need to get oximizer, insure that he is adequately oxygenated - diuretics as ordered - treat underlying conditions - consider w/u OSA in the future - will start process to get Letaris  Problem # 2:  C O P D (ICD-496) - Spiiva  Problem # 3:  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 231mdaily 2)  Thiamine Hcl 100 Mg Tabs (Thiamine hcl) ...Marland Kitchen 1 by mouth daily 3)  Klor-con M20 20 Meq Cr-tabs (Potassium chloride crys cr) ...Marland Kitchen 1 by mouth daily 4)  Coumadin 3 Mg Tabs (Warfarin sodium) .... As directed 5)  Folic Acid 1 Mg Tabs (Folic acid) ...Marland Kitchen 1 by mouth daily  Other Orders: Pulmonary Referral (Pulmonary) DME Referral (DME) Consultation Level IV (9(20233TLB-Hepatic/Liver Function Pnl (80076-HEPATIC)  Patient Instructions: 1)  We will decrease your prednisone to 1040my mouth once daily  2)  Continue your Spiriva once daily  3)  Continue your oxygen. We will ask your company to get an oximizer and to titrate your oxygen when you walk.  4)  Stay on your diuretics and other medications as ordered by Dr SolElisabeth Cara  Continue your coumadin.  6)  We will start the process to obtain Letaris, a medication to treat pulmonary hypertension.  7)  Follow up with Dr ByrLamonte Sakai 2 weeks.

## 2010-05-22 NOTE — Medication Information (Signed)
Summary: Let[facility]is/[facility]  [facility]   Imported By: Bubba Hales 12/11/2009 07:55:14  _____________________________________________________________________  External Attachment:    Type:   Image     Comment:   External Document

## 2010-05-22 NOTE — Assessment & Plan Note (Signed)
Summary: PAH, COPD, ILD   Visit Type:  Follow-up Primary Provider/Referring Provider:  Dr. Deland Pretty  CC:  PAH...COPD...patient c/o increased SOB with any exertion...unable to get any mucus up and out with his cough.  History of Present Illness: 61 yo man, former smoker with COPD, hx psoriatic arthritis and associated ILD, profound hypoxemia and associated Pulm HTN (PAP 57/26 mean 37, PAOP 14/11 mean 7 by cath 10/04/09. 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 Admitted 6/21-27 with resp failure and decompensated R heart failure. He was diuresed, treated with corticosteroids and started on bronchodilators for his COPD. O2 was titrated to 5L/min at rest, he is leaving it on 5 with exetion. He presents now post-hosp to discuss his lung disease and his pulm HTN.   He tells me that his breathing is improved since hospitalization. He is limited by the short duration of his O2 tanks on high flows [facility]). Was started on Spiriva, seems to be tolerating well. His LE edema is markedly improved, Spironolactone added on d/c. Tapering Pred, currently on 40m once daily, his prior mainatance dose was 161monce daily. Psoriasis and rheumatoid disease under good control for the last 2 yrs. No snoring, no witnessed apneas.   ROV 11/19/09 -- returns for f/u Pulm HTN in setting of psoriatic arthritis, ILD, hypoxemia. last time we discussed starting Letaris. I held the med because he has slight transaminitis. His repeat LFT's since last time had normalized. He is c/o nasal sores and clots from high flow O2, asking for a mask instead of [place] or oximizer to use sometimes to give his nose a break. Down to his baseline Pred 1078mnce daily.   ROV 01/13/10 -- 59yo, Hx psoriatic arthritis, ILD and R heart failure, hypoxemia, PAH, on chronic pred 72m40mce daily. Also hx COPD. Started Letaris at last visit after LFT's normalized. He describes today a slow decline in exertional tolerance since hosp 6/21-27.  Has been increasing oximizer to 4L/min with exertion. Having thick sputum, hard to get up, white/tan. Worst in the evening. No wheezing. No CP. Edema has been stable, he gets some at the end of the day, then resolves by am. He doesn't notice any difference in his breathing on the letaris.   Current Medications (verified): 1)  Furosemide 40 Mg Tabs (Furosemide) .... Take 1 Tablet By Mouth Once A Day 2)  Multivitamins  Tabs (Multiple Vitamin) .... Take 1 Tablet By Mouth Once A Day 3)  Prednisone 10 Mg Tabs (Prednisone) .... Marland Kitchen By Mouth Daily 4)  Protonix 40 Mg Tbec (Pantoprazole Sodium) .... Take 1 Tablet By Mouth Once A Day 5)  Spironolactone 25 Mg Tabs (Spironolactone) .... Take 1/2 Tablet By Mouth Two Times A Day 6)  Spiriva Handihaler 18 Mcg  Caps (Tiotropium Bromide Monohydrate) .... Two Puffs in Handihaler Daily 7)  Pletal 100 Mg Tabs (Cilostazol) .... Take 1 Tablet By Mouth Two Times A Day 8)  Humira 40 Mg/0.8ml 41m (Adalimumab) .... Every 2 Weeks 9)  Thiamine Hcl 100 Mg Tabs (Thiamine Hcl) .... 1Marland KitchenBy Mouth Daily 10)  Klor-Con M20 20 Meq Cr-Tabs (Potassium Chloride Crys Cr) .... 1Marland KitchenBy Mouth Daily 11)  Coumadin 3 Mg Tabs (Warfarin Sodium) .... As Directed 12)  Folic Acid 1 Mg Tabs (Folic Acid) .... 1Marland KitchenBy Mouth Daily 13)  Letairis 5 Mg Tabs (Ambrisentan) .... 1Marland KitchenBy Mouth Daily 14)  Tylenol Arthritis Pain 650 Mg Cr-Tabs (Acetaminophen) .... 2 By Mouth Two Times A Day  Allergies (verified): No Known Drug Allergies  Vital Signs:  Patient profile:   60 year old male Height:      67 inches (170.18 cm) Weight:      170 pounds (77.27 kg) BMI:     26.72 O2 Sat:      86 % on 3 L/min with oximizer Temp:     98.2 degrees F (36.78 degrees C) oral Pulse rate:   102 / minute BP sitting:   118 / 80  (right arm) Cuff size:   regular  Vitals Entered By: Francesca Jewett CMA (January 13, 2010 2:24 PM)  O2 Sat at Rest %:  86 O2 Flow:  3 L/min with oximizer  O2 Sat Comments Sats were 86% on 3 liters  with oximizer upon entering the exam room. After resting for approx 3 minutes his sats came up to 90% on 3 liters with oximizer.Francesca Jewett CMA  January 13, 2010 2:28 PM  Physical Exam  General:  normal appearance and healthy appearing, hyperpigmented skin Head:  normocephalic and atraumatic Eyes:  conjunctiva and sclera clear Nose:  scar on tip of nose, no other lesions Mouth:  no deformity or lesions Neck:  no masses, thyromegaly, or abnormal cervical nodes Lungs:  bilateral basilar insp crackles, L>R, no wheezes Heart:  regular, S1, S2 + S4 Abdomen:  not examined Extremities:  trace pretibial edema, markedly improved from hospitalization Neurologic:  non-focal Skin:  some mild psoriatic changes both calves Psych:  alert and cooperative; normal mood and affect; normal attention span and concentration   Impression & Recommendations:  Problem # 1:  PULMONARY HYPERTENSION, 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)  contoinue letaris for now, may be too early to derive clinically benefit check 6 moin walk and TTE in several mo O2 as ordered  Orders: Est. Patient Level IV (18299)  Problem # 2:  C O P D (ICD-496) Spiriva add ventolin as needed mucinex as needed  full pfts next time  Problem # 3:  PULMONARY FIBROSIS (ICD-515)  Problem # 4:  PSORIASIS (ICD-696.1)  Medications Added to Medication List This Visit: 1)  Prednisone 10 Mg Tabs (Prednisone) .Marland Kitchen.. 1 by mouth daily 2)  Spironolactone 25 Mg Tabs (Spironolactone) .... Take 1/2 tablet by mouth two times a day 3)  Letairis 5 Mg Tabs (Ambrisentan) .Marland Kitchen.. 1 by mouth daily 4)  Tylenol Arthritis Pain 650 Mg Cr-tabs (Acetaminophen) .... 2 by mouth two times a day 5)  Ventolin Hfa 108 (90 Base) Mcg/act Aers (Albuterol sulfate) .... 2 puffs q4h as needed sob 6)  Ventolin Hfa 108 (90 Base) Mcg/act Aers (Albuterol sulfate) .... 2 puffs q4h as needed sob  Patient Instructions: 1)  Continue your Letaris 29m by mouth once daily  2)  We will notify you  about your labwork results from today when they are available.  3)  Continue your oxygen at 2L/min at rest, 4L/min with exertion.  4)  We will start mucinex once daily as needed (over the counter) 5)  Continue your Spiriva once daily  6)  We will start Ventolin 2 puffs up to every 4 hours if needed for shortness of breath.  7)  Continue your Prednisone as ordered.  8)  We will perform full pulmonary function testing at the time of your next appointment.  9)  We will repeat your echocardiogram and 6 minute walk in several months.  10)  Follow up with Dr BLamonte Sakaiin 1 month or as needed.  Prescriptions: VENTOLIN HFA 108 (90 BASE) MCG/ACT AERS (ALBUTEROL  SULFATE) 2 puffs q4h as needed sob  #3 x 3   Entered and Authorized by:   Collene Gobble MD   Signed by:   Collene Gobble MD on 01/13/2010   Method used:   Printed then faxed to ...       [facility] [id number] (retail)       [address], [place]  [address]       Ph: [phone]       Fax: [phone]   RxID:   [id number] VENTOLIN HFA 108 (90 BASE) MCG/ACT AERS (ALBUTEROL SULFATE) 2 puffs q4h as needed sob  #1 x 2   Entered and Authorized by:   Collene Gobble MD   Signed by:   Collene Gobble MD on 01/13/2010   Method used:   Electronically to        [phone] (retail)       [address]       [place], [place]  [address]       Ph: [phone]       Fax: [phone]   RxID:   [id number]

## 2010-05-22 NOTE — Assessment & Plan Note (Signed)
Summary: PAH, ILD, psoriatic arthritis, COPD   Visit Type:  Follow-up Primary Provider/Referring Provider:  Dr. Deland Pretty  CC:  Patient is here for 2 week follow-up. He states his breathing is no better or worse. He does c/o nose bleeds with oxygen use and is requesting a face mask, instead of, and the nasal cannula. The patient has not started Letaris.Marland Kitchen  History of Present Illness: 60 yo man, former smoker with COPD, hx psoriatic arthritis and associated ILD, profound hypoxemia and associated Pulm HTN (PAP 57/26 mean 37, PAOP 14/11 mean 7 by cath 10/04/09. 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 Admitted 6/21-27 with resp failure and decompensated R heart failure. He was diuresed, treated with corticosteroids and started on bronchodilators for his COPD. O2 was titrated to 5L/min at rest, he is leaving it on 5 with exetion. He presents now post-hosp to discuss his lung disease and his pulm HTN.   He tells me that his breathing is improved since hospitalization. He is limited by the short duration of his O2 tanks on high flows [facility]). Was started on Spiriva, seems to be tolerating well. His LE edema is markedly improved, Spironolactone added on d/c. Tapering Pred, currently on 60m once daily, his prior mainatance dose was 125monce daily. Psoriasis and rheumatoid disease under good control for the last 2 yrs. No snoring, no witnessed apneas.   ROV 11/19/09 -- returns for f/u Pulm HTN in setting of psoriatic arthritis, ILD, hypoxemia. last time we discussed starting Letaris. I held the med because he has slight transaminitis. His repeat LFT's since last time had normalized. He is c/o nasal sores and clots from high flow O2, asking for a mask instead of [place] or oximizer to use sometimes to give his nose a break. Down to his baseline Pred 1066mnce daily.   Current Medications (verified): 1)  Furosemide 40 Mg Tabs (Furosemide) .... Take 1 Tablet By Mouth Once A Day 2)   Multivitamins  Tabs (Multiple Vitamin) .... Take 1 Tablet By Mouth Once A Day 3)  Prednisone 10 Mg Tabs (Prednisone) .... 68m10mily 4)  Protonix 40 Mg Tbec (Pantoprazole Sodium) .... Take 1 Tablet By Mouth Once A Day 5)  Spironolactone 25 Mg Tabs (Spironolactone) .... Take 1 Tablet By Mouth Two Times A Day 6)  Spiriva Handihaler 18 Mcg  Caps (Tiotropium Bromide Monohydrate) .... Two Puffs in Handihaler Daily 7)  Pletal 100 Mg Tabs (Cilostazol) .... Take 1 Tablet By Mouth Two Times A Day 8)  Humira 40 Mg/0.8ml 85m (Adalimumab) .... Every 2 Weeks 9)  Thiamine Hcl 100 Mg Tabs (Thiamine Hcl) .... 1Marland KitchenBy Mouth Daily 10)  Klor-Con M20 20 Meq Cr-Tabs (Potassium Chloride Crys Cr) .... 1Marland KitchenBy Mouth Daily 11)  Coumadin 3 Mg Tabs (Warfarin Sodium) .... As Directed 12)  Folic Acid 1 Mg Tabs (Folic Acid) .... 1Marland KitchenBy Mouth Daily  Allergies (verified): No Known Drug Allergies  Vital Signs:  Patient profile:   59 ye73 old male Height:      67 inches (170.18 cm) Weight:      167 pounds (75.91 kg) BMI:     26.25 O2 Sat:      91 % on 5 L/min Temp:     97.6 degrees F (36.44 degrees C) oral Pulse rate:   96 / minute BP sitting:   112 / 74  (right arm) Cuff size:   regular  Vitals Entered By: [name] Francesca Jewett(November 19, 2009 3:40 PM)  O2 Sat at Rest %:  91 O2 Flow:  5 L/min CC: Patient is here for 2 week follow-up. He states his breathing is no better or worse. He does c/o nose bleeds with oxygen use and is requesting a face mask, instead of, the nasal cannula. The patient has not started Letaris. Comments Medications reviewed. Daytime phone verified. Francesca Jewett CMA  November 19, 2009 3:41 PM   Physical Exam  General:  normal appearance and healthy appearing, hyperpigmented skin Head:  normocephalic and atraumatic Eyes:  conjunctiva and sclera clear Nose:  scar on tip of nose, no other lesions Mouth:  no deformity or lesions Neck:  no masses, thyromegaly, or abnormal cervical nodes Lungs:  bilateral  basilar insp crackles, L>R, no wheezes Heart:  regular, S1, S2 + S4 Abdomen:  not examined Extremities:  trace pretibial edema, markedly improved from hospitalization Neurologic:  non-focal Skin:  some mild psoriatic changes both calves Psych:  alert and cooperative; normal mood and affect; normal attention span and concentration   Impression & Recommendations:  Problem # 1:  PULMONARY HYPERTENSION, 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)  Orders: Est. Patient Level IV (74944) DME Referral (DME) Pulmonary Referral (Pulmonary)  Problem # 2:  PULMONARY FIBROSIS (ICD-515)  Problem # 3:  PSORIASIS (ICD-696.1)  Problem # 4:  C O P D (ICD-496)  Problem # 5:  HYPOXEMIA (ICD-799.02)  Patient Instructions: 1)  We will get you an oxygen mask to use at home 2)  We will convert you to oximizer, 4L/min at rest and 5L/min with exertion 3)  We will reinitiate your Letaris prescription now that your liver testing has normalized. You will need LFT's every month 4)  Continue your coumadin 5)  Continue Spiriva once daily  6)  Continue Prednisone 14m once daily  7)  Follow with Dr BLamonte Sakaiin 6 weeks

## 2010-05-22 NOTE — Medication Information (Signed)
Summary: LEAP Program/[facility]  LEAP Program/[facility]   Imported By: Bubba Hales 11/15/2009 08:57:53  _____________________________________________________________________  External Attachment:    Type:   Image     Comment:   External Document

## 2010-05-22 NOTE — Procedures (Signed)
Summary: Oximtery Titration/[facility]  Oximtery Titration/[facility]   Imported By: Bubba Hales 11/22/2009 13:09:36  _____________________________________________________________________  External Attachment:    Type:   Image     Comment:   External Document

## 2010-05-22 NOTE — Progress Notes (Signed)
Summary: Letairis shipment  Phone Note From Other Clinic   Caller: zanetta with [facility] Call For: dr. Lamonte Sakai Summary of Call: Farrel Gordon phoned stated that she spoke to Mr. Pieroni regarding his Milda Smart and he informed her that this medication has been discontinued. Please advise Zanetta at [phone]  Initial call taken by: Ozella Rocks,  April 22, 2010 2:09 PM  Follow-up for Phone Call        The patient has been informed that he will continue on Letairis and the medication has not been discontinued. He also understands that Tyvaso is being added to his treatment therapy for PAH. [facility] does not open until 9am and I will call back at that time so they know to ship Letairis to the patient. Follow-up by: Francesca Jewett CMA,  April 23, 2010 8:49 AM  Additional Follow-up for Phone Call Additional follow up Details #1::        I spoke with Dorisann Frames at [facility] and she is aware that the patient will continue Letairis and also start Tyvaso. They understand that the patient was confused and that he also now understands that he will be taking two medications for PAH. They will ship Letairis to the patient. Additional Follow-up by: Francesca Jewett CMA,  April 23, 2010 9:31 AM

## 2010-05-22 NOTE — Letter (Signed)
Summary: [facility]  [facility]   Imported By: Phillis Knack 11/05/2009 12:13:24  _____________________________________________________________________  External Attachment:    Type:   Image     Comment:   External Document

## 2010-05-22 NOTE — Assessment & Plan Note (Signed)
Summary: PAH, RA/ILD, COPD, hypoxemia   Visit Type:  Follow-up Primary Provider/Referring Provider:  Dr. Deland Pretty  CC:  1 month followup PAH and COPD.  Pt states that his breathing is the same- no better or worse.  He states that chest congestion is some better with mucinex- has little cough that is prod with brown sputum.[name]  History of Present Illness: 60 yo man, former smoker with COPD, hx psoriatic arthritis and associated ILD, profound hypoxemia and associated Pulm HTN (PAP 57/26 mean 37, PAOP 14/11 mean 7 by cath 10/04/09. 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 Admitted 6/21-27 with resp failure and decompensated R heart failure. He was diuresed, treated with corticosteroids and started on bronchodilators for his COPD. O2 was titrated to 5L/min at rest, he is leaving it on 5 with exertion.   ROV 11/19/09 -- returns for f/u Pulm HTN in setting of psoriatic arthritis, ILD, hypoxemia. last time we discussed starting Letaris. I held the med because he has slight transaminitis. His repeat LFT's since last time had normalized. He is c/o nasal sores and clots from high flow O2, asking for a mask instead of [place] or oximizer to use sometimes to give his nose a break. Down to his baseline Pred 9m once daily.   ROV 01/13/10 -- 59yo, Hx psoriatic arthritis, ILD and R heart failure, hypoxemia, PAH, on chronic pred 120monce daily. Also hx COPD. Started Letaris at last visit after LFT's normalized. He describes today a slow decline in exertional tolerance since hosp 6/21-27. Has been increasing oximizer to 4L/min with exertion. Having thick sputum, hard to get up, white/tan. Worst in the evening. No wheezing. No CP. Edema has been stable, he gets some at the end of the day, then resolves by am. He doesn't notice any difference in his breathing on the letaris.   ROV 02/05/10 -- 59yo, PAH with Hx psoriatic arthritis, ILD and R heart failure, hypoxemia, on chronic pred 1065mnce daily. Also hx COPD.   Since last time breathing may be a bit better. He has good days and bad days. Rarely uses ventolin. No HA, no syncope or near syncope. LE edema has been well controlled and stable. Was desaturated while walking today on presentation. Has been gaining some wt, aldactone was decreased by Dr SolElisabeth Caramonth ago. PFTs done today.   Current Medications (verified): 1)  Furosemide 40 Mg Tabs (Furosemide) .... Take 1 Tablet By Mouth Once A Day 2)  Multivitamins  Tabs (Multiple Vitamin) .... Take 1 Tablet By Mouth Once A Day 3)  Prednisone 10 Mg Tabs (Prednisone) ....Derek Kitchen1 By Mouth Daily 4)  Protonix 40 Mg Tbec (Pantoprazole Sodium) .... Take 1 Tablet By Mouth Once A Day 5)  Spironolactone 25 Mg Tabs (Spironolactone) .... Take 1/2 Tablet By Mouth Two Times A Day 6)  Spiriva Handihaler 18 Mcg  Caps (Tiotropium Bromide Monohydrate) .... Two Puffs in Handihaler Daily 7)  Pletal 100 Mg Tabs (Cilostazol) .... Take 1 Tablet By Mouth Two Times A Day 8)  Humira 40 Mg/0.8ml28mt (Adalimumab) .... Every 2 Weeks 9)  Thiamine Hcl 100 Mg Tabs (Thiamine Hcl) .... [name] By Mouth Daily 10)  Klor-Con M20 20 Meq Cr-Tabs (Potassium Chloride Crys Cr) .... [name] By Mouth Daily 11)  Coumadin 3 Mg Tabs (Warfarin Sodium) .... As Directed 12)  Folic Acid 1 Mg Tabs (Folic Acid) .... [name] By Mouth Daily 13)  Letairis 5 Mg Tabs (Ambrisentan) .... [name] By Mouth Daily 14)  Tylenol Arthritis Pain 650 Mg Cr-Tabs (Acetaminophen) .... 2 By Mouth Two Times A Day 15)  Ventolin Hfa 108 (90 Base) Mcg/act Aers (Albuterol Sulfate) .... 2 Puffs Q4h As Needed Sob 16)  Simvastatin 20 Mg Tabs (Simvastatin) .[name].. 1 Once Daily 17)  Mucinex 600 Mg Xr12h-Tab (Guaifenesin) .[name].. 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    Physical Exam  General:  normal appearance and healthy appearing, hyperpigmented skin Head:  normocephalic and atraumatic Eyes:  conjunctiva and sclera clear Nose:  scar on tip of nose, no other lesions Mouth:  no deformity or lesions Neck:  no masses, thyromegaly, or abnormal cervical nodes Lungs:  bilateral basilar insp crackles, L>R, no wheezes Heart:  regular, S1, S2 + S4 Abdomen:  not examined Extremities:  increased LE edema compared w last time. 1+ pitting edema.  Neurologic:  non-focal Skin:  some mild psoriatic changes both calves Psych:  alert and cooperative; normal mood and affect; normal attention span and concentration   Pulmonary Function Test Date: 02/05/2010 Height (in.): 67 Gender: Male  Pre-Spirometry FVC    Value: 3.44 L/min   Pred: 4.15 L/min     % Pred: 83 % FEV1    Value: 2.56 L     Pred: 2.95 L     % Pred: 87 % FEV1/FVC  Value: 75 %     Pred: 71 %     % Pred: - % FEF 25-75  Value: 1.96 L/min   Pred: 2.92 L/min     % Pred: 67 %  Post-Spirometry FVC    Value: 3.46 L/min   Pred: 4.15 L/min     % Pred: 83 % FEV1    Value: 2.60 L     Pred: 2.95 L     % Pred: 88 % FEV1/FVC  Value: 75 %     Pred: 71 %     % Pred: - % FEF 25-75  Value: 2.09 L/min   Pred: 2.92 L/min     % Pred: 72 %  Lung Volumes TLC    Value: 5.43 L   % Pred: 83 % RV    Value: 1.99 L   %  Pred: 91 % DLCO    Value: 5.8 %   % Pred: 26 % DLCO/VA  Value: 1.37 %   % Pred: 37 %  Comments: Arlyce Harman suggests mixed obstruction and restriction. Volumes in normal range. Severely decreased DLCO. RSB  Impression & Recommendations:  Problem # 1:  PULMONARY HYPERTENSION, 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) Surprisingly little evidence for restriction or COPD on PFTs, although pt's with both can sometimes show  pseudonormalization of spirometry and volumes. The DLCO is very low, consistent w pts hypoxemia.  Letaris 5 for now, reassess TTE and 6 min walk in December increase lasix temporarily x 3 days oximizer consider increasing letaris or adding second agent next time.  Orders: Est. 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 1 once daily 2)  Mucinex 600 Mg Xr12h-tab (Guaifenesin) .[name].. 1 once daily  Patient Instructions: 1)  Oxygen at 2.5L/min at rest and 5L/min with exertion.  2)  Increase your lasix to 71m two times a day for the next 3 days then go back to once daily  3)  Continue Letaris 568monce daily  4)  Continue Spiriva once daily  5)  Continue ProAir as needed  6)  We will perform a 6 minute walk and an echocardiogram in December. 7)  Follow up with Dr ByLamonte Sakaio review your results in December.

## 2010-05-22 NOTE — Progress Notes (Signed)
Summary: wants rx for new mask  Phone Note Call from Patient Call back at Home Phone [phone]   Caller: Patient Call For: byrum Reason for Call: Talk to Nurse, Talk to Doctor Summary of Call: patient is using oxygen, he is having problems with his nose bleeding from the tube. and he is just getting dried out beacuse of it. he would like a rx for a tube that covers his nose and his mouth. he uses [place] [facility].  he wants to know if this would be safe for him and he just wants a nurses opinion for this. Initial call taken by: Adin Hector,  November 07, 2009 4:48 PM  Follow-up for Phone Call        called spoke with patient who states that he uses 5L/min continuous oxygen.  using the nasal cannula his nose becomes very dry occ causing nose bleeds and requests a facial mask.  pt states that he does have a humidifier on his concentrator and does not want to try the nasal saline gel.  RB is not in the office today.  will forward to doc of the day, but pt is aware that may not get a call back today. Parke Poisson CNA/MA  November 07, 2009 5:07 PM   per CDY, will forward to [place]. Parke Poisson CNA/MA  November 07, 2009 5:16 PM   Will discuss with him at [facility] MD  November 15, 2009 11:42 AM  Follow-up by: Collene Gobble MD,  November 15, 2009 11:42 AM

## 2010-05-22 NOTE — Progress Notes (Signed)
Summary: [facility] calling  Phone Note From Pharmacy   Caller: brandon w/ [facility] Call For: byrum  Summary of Call: caller wants verbal order for PANTOPRAZOLE and PREDNISONe (apparently having trouble w/  orders for this). ref # [id number] Initial call taken by: Cooper Render, CNA,  November 14, 2009 3:46 PM  Follow-up for Phone Call        Spoke with pharmacist at [facility] and gave verbal okay to refill pantoprazole and prednisone,90 days supply with 3 rf.  Follow-up by: Tilden Dome,  November 14, 2009 4:01 PM    Prescriptions: PROTONIX 40 MG TBEC (PANTOPRAZOLE SODIUM) Take 1 tablet by mouth once a day  #90 x 3   Entered by:   Tilden Dome   Authorized by:   Collene Gobble MD   Signed by:   Tilden Dome on 11/14/2009   Method used:   Telephoned to ...       [facility] Fax) (mail-order)             ,          Ph: [phone]       Fax: [phone]   RxID:   [id number] PREDNISONE 10 MG TABS (PREDNISONE) 44m daily  #180 x 3   Entered by:   LTilden Dome  Authorized by:   RCollene GobbleMD   Signed by:   LTilden Domeon 11/14/2009   Method used:   Telephoned to ...       [facility]             ,          Ph: [phone]      Fax: [phone]  RxID:   [id number]

## 2010-05-22 NOTE — Procedures (Signed)
[facility]: [facility]   Imported By: Phillis Knack 01/23/2010 08:27:31  _____________________________________________________________________  External Attachment:    Type:   Image     Comment:   External Document

## 2010-05-22 NOTE — Miscellaneous (Signed)
Summary: Orders Update  Clinical Lists Changes  Orders: Added new Test order of T-2 View CXR (71020TC) - Signed 

## 2010-05-22 NOTE — Letter (Signed)
Summary: [facility]  [address], [place] [address]   Phone: [phone]  Fax: [phone]    11/15/2009  [address], [place]  [address]  Dear Mr. Taffe,  Here are your dates for recurring lab appointments:  12/13/2009 01/13/2010 02/10/2010 03/10/2010 04/07/2010 05/05/2009 06/02/2009 06/29/2009 08/06/2009 08/24/2009 09/21/2009 10/19/2009 11/13/2009  Please make sure you have your labs done each and every month. If you have any questions, please call our office at [phone].        Sincerely,   Francesca Jewett CMA

## 2010-05-23 NOTE — Medication Information (Signed)
Summary: [facility]  [facility]   Imported By: Bubba Hales 01/13/2010 10:30:40  _____________________________________________________________________  External Attachment:    Type:   Image     Comment:   External Document

## 2010-05-28 NOTE — Assessment & Plan Note (Signed)
Summary: PAH, ILD, COPD   Visit Type:  Follow-up Primary Provider/Referring Provider:  Dr. Deland Pretty  CC:  PAH.  On Tyvaso x1 month and no changes in his breathing.Marland KitchenMarland KitchenPt c/o edema in lower extremities every evening.  History of Present Illness: 60 yo man, former smoker with COPD, hx psoriatic arthritis and associated ILD, profound hypoxemia and associated Pulm HTN (PAP 57/26 mean 37, PAOP 14/11 mean 7 by cath 10/04/09. 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 Admitted 6/21-27 with resp failure and decompensated R heart failure. He was diuresed, treated with corticosteroids and started on bronchodilators for his COPD. O2 was titrated to 5L/min at rest, he is leaving it on 5 with exertion.   ROV 02/05/10 -- 59yo, PAH with Hx psoriatic arthritis, ILD and R heart failure, hypoxemia, on chronic pred 61m once daily. Also hx COPD.  On letaris since September '11. Since last time breathing may be a bit better. He has good days and bad days. Rarely uses ventolin. No HA, no syncope or near syncope. LE edema has been well controlled and stable. Was desaturated while walking today on presentation. Has been gaining some wt, aldactone was decreased by Dr SElisabeth Caraa month ago. PFTs done today.   ROV 04/16/10 -- Hx as above, follows up after TTE and 6 min walk. Returns feeling a bit more SOB w exertion. 6 min walk distance = 1657massoc with significant desat to 75% (after 2 min). Estimated RVSP 7969m(up from 59 in 09/2009). Has been on letaris since September. Edema seems better on lasix 31m29mo times a day. Clearly both TTE and walk distance are worse.   ROV 05/19/10 -- returns for f/u of the above issues. last time we started Tyvaso in addition to his letaris. Has been on it for a month. Up to 9 puffs 4x a day. Has been assoc with some HA, occas cough right after doing it, not intrusive. On Pred 10mg63miriva + ventoilin as needed. He is not noticing any big changes in his breathing yet on the Tyvaso.  Monthly LFT's normal 1/17.   Current Medications (verified): 1)  Furosemide 40 Mg Tabs (Furosemide) .... 1Marland KitchenBy Mouth Two Times A Day 2)  Multivitamins  Tabs (Multiple Vitamin) .... Take 1 Tablet By Mouth Once A Day 3)  Prednisone 10 Mg Tabs (Prednisone) .... 1Marland KitchenBy Mouth Daily 4)  Protonix 40 Mg Tbec (Pantoprazole Sodium) .... Take 1 Tablet By Mouth Once A Day 5)  Spironolactone 25 Mg Tabs (Spironolactone) .... Take 1/2 Tablet By Mouth Two Times A Day 6)  Spiriva Handihaler 18 Mcg  Caps (Tiotropium Bromide Monohydrate) .... Two Puffs in Handihaler Daily 7)  Pletal 100 Mg Tabs (Cilostazol) .... Take 1 Tablet By Mouth Two Times A Day 8)  Humira 40 Mg/0.8ml K52m(Adalimumab) .... Every 2 Weeks 9)  Thiamine Hcl 100 Mg Tabs (Thiamine Hcl) .... 1 Marland Kitcheny Mouth Daily 10)  Klor-Con M20 20 Meq Cr-Tabs (Potassium Chloride Crys Cr) .... 2 By Mouth Daily 11)  Coumadin 3 Mg Tabs (Warfarin Sodium) .... As Directed 12)  Folic Acid 1 Mg Tabs (Folic Acid) .... 1 Marland Kitcheny Mouth Daily 13)  Letairis 5 Mg Tabs (Ambrisentan) .... 1 Marland Kitcheny Mouth Daily 14)  Tylenol Arthritis Pain 650 Mg Cr-Tabs (Acetaminophen) .... 2 By Mouth Two Times A Day 15)  Ventolin Hfa 108 (90 Base) Mcg/act Aers (Albuterol Sulfate) .... 2 Puffs Q4h As Needed Sob 16)  Simvastatin 20 Mg Tabs (Simvastatin) .... 1 Marland Kitchennce  Daily 17)  Mucinex 600 Mg Xr12h-Tab (Guaifenesin) .Marland Kitchen.. 1 Once Daily 18)  Tyvaso 0.6 Mg/ml Soln (Treprostinil) .... 9 Puffs Four Times Daily  Allergies (verified): No Known Drug Allergies  Vital Signs:  Patient profile:   60 year old male Height:      67 inches (170.18 cm) Weight:      183.25 pounds (83.30 kg) BMI:     28.80 O2 Sat:      90 % on 4 L/min Temp:     97.6 degrees F (36.44 degrees C) oral Pulse rate:   93 / minute BP sitting:   118 / 62  (left arm) Cuff size:   regular  Vitals Entered By: Francesca Jewett CMA (May 19, 2010 10:35 AM)  O2 Sat at Rest %:  90 O2 Flow:  4 L/min CC: PAH.  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 Patient Level IV (66060)  Problem # 2:  C O P D (ICD-496)  Problem # 3:  PULMONARY FIBROSIS (ICD-515)  Medications Added to Medication List This Visit: 1)  Tyvaso 0.6 Mg/ml Soln (Treprostinil) .... 9 puffs four times daily  Patient Instructions: 1)  Please continue your Letaris once daily 2)  We will check your liver tests every month 3)  Continue your Tyvaso 9 puffs 4 times a day 4)  Continue your prednisone 40m by mouth once daily  5)  Continue coumadin 6)  [facility]  Continue your Spiriva once daily.  8)  Follow up with Dr BLamonte Sakaiin 2 months or as needed

## 2010-05-28 NOTE — Miscellaneous (Signed)
Summary: [facility]ification [facility] Plan of [facility] / [place] [facility]   Imported By: Rise Patience 05/19/2010 14:18:56  _____________________________________________________________________  External Attachment:    Type:   Image     Comment:   External Document

## 2010-06-02 ENCOUNTER — Encounter (INDEPENDENT_AMBULATORY_CARE_PROVIDER_SITE_OTHER): Payer: Self-pay | Admitting: *Deleted

## 2010-06-02 ENCOUNTER — Other Ambulatory Visit: Payer: Self-pay

## 2010-06-02 ENCOUNTER — Other Ambulatory Visit: Payer: Self-pay | Admitting: Emergency Medicine

## 2010-06-02 DIAGNOSIS — I2789 Other specified pulmonary heart diseases: Secondary | ICD-10-CM

## 2010-06-02 LAB — HEPATIC FUNCTION PANEL
AST: 28 U/L (ref 0–37)
Bilirubin, Direct: 0.2 mg/dL (ref 0.0–0.3)

## 2010-06-16 ENCOUNTER — Telehealth (INDEPENDENT_AMBULATORY_CARE_PROVIDER_SITE_OTHER): Payer: Self-pay | Admitting: *Deleted

## 2010-06-17 NOTE — Consult Note (Signed)
Summary: [facility]  [facility]   Imported By: Phillis Knack 06/13/2010 09:18:02  _____________________________________________________________________  External Attachment:    Type:   Image     Comment:   External Document

## 2010-06-23 ENCOUNTER — Encounter: Payer: Self-pay | Admitting: Emergency Medicine

## 2010-06-26 NOTE — Progress Notes (Signed)
Summary: having heart cath 3/9 and wants to know when he stop fluid pills  Phone Note Call from Patient   Caller: Patient Call For: [place] of Call: Patient phoned stated that he is scheduled for a heart cath at [facility] on Friday March 9th for pre lung transplant testing and he wants to know when he should stop the fluid pills. Patient can be reached at [phone] Initial call taken by: Ozella Rocks,  June 16, 2010 4:22 PM  Follow-up for Phone Call        Patient is scheduled for a heart cath at [facility] on March 9th and was told he would need to "HOLD" Furosemide before and was instructed to contact the prescribing doctor for instructions on when and how to do this. Pls advise.Francesca Jewett [facility]  June 16, 2010 5:23 PM  Please ask him to hold lasix after last dose on 3/7 (do not take any on 3/8 or 3/9). Thanks Collene Gobble MD  June 17, 2010 2:48 PM  Follow-up by: Collene Gobble MD,  June 17, 2010 2:48 PM  Additional Follow-up for Phone Call Additional follow up Details #1::        Spoke with pt and notified of the above recs per RB and he verbalized understanding. Additional Follow-up by: Tilden Dome,  June 17, 2010 2:55 PM

## 2010-06-30 ENCOUNTER — Other Ambulatory Visit: Payer: Self-pay | Admitting: Emergency Medicine

## 2010-06-30 ENCOUNTER — Other Ambulatory Visit: Payer: BC Managed Care – PPO

## 2010-06-30 ENCOUNTER — Encounter (INDEPENDENT_AMBULATORY_CARE_PROVIDER_SITE_OTHER): Payer: Self-pay | Admitting: *Deleted

## 2010-06-30 DIAGNOSIS — I2789 Other specified pulmonary heart diseases: Secondary | ICD-10-CM

## 2010-06-30 LAB — HEPATIC FUNCTION PANEL
ALT: 35 U/L (ref 0–53)
AST: 32 U/L (ref 0–37)
Albumin: 4 g/dL (ref 3.5–5.2)

## 2010-07-06 LAB — MAGNESIUM: Magnesium: 2 mg/dL (ref 1.5–2.5)

## 2010-07-06 LAB — GLUCOSE, CAPILLARY
Glucose-Capillary: 149 mg/dL — ABNORMAL HIGH (ref 70–99)
Glucose-Capillary: 150 mg/dL — ABNORMAL HIGH (ref 70–99)
Glucose-Capillary: 170 mg/dL — ABNORMAL HIGH (ref 70–99)
Glucose-Capillary: 175 mg/dL — ABNORMAL HIGH (ref 70–99)
Glucose-Capillary: 176 mg/dL — ABNORMAL HIGH (ref 70–99)
Glucose-Capillary: 221 mg/dL — ABNORMAL HIGH (ref 70–99)
Glucose-Capillary: 237 mg/dL — ABNORMAL HIGH (ref 70–99)
Glucose-Capillary: 248 mg/dL — ABNORMAL HIGH (ref 70–99)
Glucose-Capillary: 311 mg/dL — ABNORMAL HIGH (ref 70–99)
Glucose-Capillary: 98 mg/dL (ref 70–99)

## 2010-07-06 LAB — BASIC METABOLIC PANEL
BUN: 15 mg/dL (ref 6–23)
BUN: 18 mg/dL (ref 6–23)
CO2: 30 mEq/L (ref 19–32)
CO2: 32 mEq/L (ref 19–32)
Calcium: 8.2 mg/dL — ABNORMAL LOW (ref 8.4–10.5)
Calcium: 8.6 mg/dL (ref 8.4–10.5)
Chloride: 101 mEq/L (ref 96–112)
Chloride: 96 mEq/L (ref 96–112)
Creatinine, Ser: 1.04 mg/dL (ref 0.4–1.5)
Creatinine, Ser: 1.05 mg/dL (ref 0.4–1.5)
GFR calc Af Amer: >60 mL/min (ref >60)
GFR calc Af Amer: >60 mL/min (ref >60)
GFR calc Af Amer: >60 mL/min (ref >60)
GFR calc non Af Amer: >60 mL/min (ref >60)
GFR calc non Af Amer: >60 mL/min (ref >60)
GFR calc non Af Amer: >60 mL/min (ref >60)
GFR calc non Af Amer: >60 mL/min (ref >60)
Glucose, Bld: 121 mg/dL — ABNORMAL HIGH (ref 70–99)
Glucose, Bld: 140 mg/dL — ABNORMAL HIGH (ref 70–99)
Glucose, Bld: 167 mg/dL — ABNORMAL HIGH (ref 70–99)
Glucose, Bld: 81 mg/dL (ref 70–99)
Potassium: 3.2 mEq/L — ABNORMAL LOW (ref 3.5–5.1)
Potassium: 3.8 mEq/L (ref 3.5–5.1)
Potassium: 4 mEq/L (ref 3.5–5.1)
Sodium: 135 mEq/L (ref 135–145)
Sodium: 137 mEq/L (ref 135–145)
Sodium: 137 mEq/L (ref 135–145)

## 2010-07-06 LAB — URINALYSIS, MICROSCOPIC ONLY
Bilirubin Urine: NEGATIVE
Glucose, UA: NEGATIVE mg/dL
Ketones, ur: NEGATIVE mg/dL
Protein, ur: NEGATIVE mg/dL
pH: 5.5 (ref 5.0–8.0)

## 2010-07-06 LAB — CBC
HCT: 45.6 % (ref 39.0–52.0)
HCT: 46.9 % (ref 39.0–52.0)
HCT: 50.6 % (ref 39.0–52.0)
Hemoglobin: 14.6 g/dL (ref 13.0–17.0)
Hemoglobin: 15.1 g/dL (ref 13.0–17.0)
Hemoglobin: 15.4 g/dL (ref 13.0–17.0)
Hemoglobin: 16.2 g/dL (ref 13.0–17.0)
Hemoglobin: 16.8 g/dL (ref 13.0–17.0)
Hemoglobin: 16.9 g/dL (ref 13.0–17.0)
MCH: 32.1 pg (ref 26.0–34.0)
MCH: 32.2 pg (ref 26.0–34.0)
MCH: 32.6 pg (ref 26.0–34.0)
MCHC: 32.5 g/dL (ref 30.0–36.0)
MCHC: 32.9 g/dL (ref 30.0–36.0)
MCHC: 33 g/dL (ref 30.0–36.0)
MCHC: 33.3 g/dL (ref 30.0–36.0)
MCHC: 33.4 g/dL (ref 30.0–36.0)
MCV: 98.3 fL (ref 78.0–100.0)
MCV: 98.6 fL (ref 78.0–100.0)
MCV: 98.8 fL (ref 78.0–100.0)
Platelets: 105 10*3/uL — ABNORMAL LOW (ref 150–400)
Platelets: 105 10*3/uL — ABNORMAL LOW (ref 150–400)
Platelets: 114 10*3/uL — ABNORMAL LOW (ref 150–400)
RBC: 4.55 MIL/uL (ref 4.22–5.81)
RBC: 4.97 MIL/uL (ref 4.22–5.81)
RBC: 5.15 MIL/uL (ref 4.22–5.81)
RBC: 5.16 MIL/uL (ref 4.22–5.81)
RDW: 14.9 % (ref 11.5–15.5)
RDW: 15 % (ref 11.5–15.5)
RDW: 15.1 % (ref 11.5–15.5)
RDW: 15.6 % — ABNORMAL HIGH (ref 11.5–15.5)
RDW: 15.6 % — ABNORMAL HIGH (ref 11.5–15.5)
WBC: 11.4 10*3/uL — ABNORMAL HIGH (ref 4.0–10.5)
WBC: 12.1 10*3/uL — ABNORMAL HIGH (ref 4.0–10.5)

## 2010-07-06 LAB — DIFFERENTIAL
Basophils Absolute: 0 10*3/uL (ref 0.0–0.1)
Basophils Relative: 0 % (ref 0–1)
Eosinophils Absolute: 0.3 10*3/uL (ref 0.0–0.7)
Eosinophils Relative: 0 % (ref 0–5)
Eosinophils Relative: 3 % (ref 0–5)
Lymphocytes Relative: 3 % — ABNORMAL LOW (ref 12–46)
Lymphs Abs: 1.7 10*3/uL (ref 0.7–4.0)
Monocytes Absolute: 0.2 10*3/uL (ref 0.1–1.0)
Monocytes Absolute: 1.2 10*3/uL — ABNORMAL HIGH (ref 0.1–1.0)
Monocytes Relative: 10 % (ref 3–12)
Monocytes Relative: 2 % — ABNORMAL LOW (ref 3–12)
Neutro Abs: 10.8 10*3/uL — ABNORMAL HIGH (ref 1.7–7.7)

## 2010-07-06 LAB — BLOOD GAS, ARTERIAL
Acid-Base Excess: 6.2 mmol/L — ABNORMAL HIGH (ref 0.0–2.0)
Bicarbonate: 29.6 mEq/L — ABNORMAL HIGH (ref 20.0–24.0)
Bicarbonate: 31.4 mEq/L — ABNORMAL HIGH (ref 20.0–24.0)
FIO2: 100 %
O2 Saturation: 88.8 %
O2 Saturation: 98.9 %
Patient temperature: 98.6
TCO2: 30.9 mmol/L (ref 0–100)
TCO2: 31.8 mmol/L (ref 0–100)
pCO2 arterial: 45.7 mmHg — ABNORMAL HIGH (ref 35.0–45.0)
pO2, Arterial: 131 mmHg — ABNORMAL HIGH (ref 80.0–100.0)
pO2, Arterial: 55.4 mmHg — ABNORMAL LOW (ref 80.0–100.0)
pO2, Arterial: 73.9 mmHg — ABNORMAL LOW (ref 80.0–100.0)

## 2010-07-06 LAB — POCT I-STAT 3, ART BLOOD GAS (G3+)
Acid-Base Excess: 6 mmol/L — ABNORMAL HIGH (ref 0.0–2.0)
pH, Arterial: 7.455 — ABNORMAL HIGH (ref 7.350–7.450)
pO2, Arterial: 89 mmHg (ref 80.0–100.0)

## 2010-07-06 LAB — LIPID PANEL
Cholesterol: 138 mg/dL (ref 0–200)
HDL: 55 mg/dL (ref >39)
LDL Cholesterol: 71 mg/dL (ref 0–99)
Triglycerides: 62 mg/dL (ref <150)

## 2010-07-06 LAB — PROTIME-INR
INR: 1.49 (ref 0.00–1.49)
INR: 1.63 — ABNORMAL HIGH (ref 0.00–1.49)
Prothrombin Time: 17.9 seconds — ABNORMAL HIGH (ref 11.6–15.2)
Prothrombin Time: 19.5 seconds — ABNORMAL HIGH (ref 11.6–15.2)
Prothrombin Time: 20.5 seconds — ABNORMAL HIGH (ref 11.6–15.2)
Prothrombin Time: 23 seconds — ABNORMAL HIGH (ref 11.6–15.2)

## 2010-07-06 LAB — COMPREHENSIVE METABOLIC PANEL
ALT: 24 U/L (ref 0–53)
AST: 30 U/L (ref 0–37)
AST: 49 U/L — ABNORMAL HIGH (ref 0–37)
Alkaline Phosphatase: 104 U/L (ref 39–117)
CO2: 32 mEq/L (ref 19–32)
CO2: 33 mEq/L — ABNORMAL HIGH (ref 19–32)
Calcium: 8.2 mg/dL — ABNORMAL LOW (ref 8.4–10.5)
Calcium: 8.5 mg/dL (ref 8.4–10.5)
Calcium: 8.8 mg/dL (ref 8.4–10.5)
Creatinine, Ser: 1.29 mg/dL (ref 0.4–1.5)
Creatinine, Ser: 1.29 mg/dL (ref 0.4–1.5)
GFR calc Af Amer: >60 mL/min (ref >60)
GFR calc Af Amer: >60 mL/min (ref >60)
GFR calc non Af Amer: 57 mL/min — ABNORMAL LOW (ref >60)
GFR calc non Af Amer: 57 mL/min — ABNORMAL LOW (ref >60)
Glucose, Bld: 122 mg/dL — ABNORMAL HIGH (ref 70–99)
Potassium: 3.4 mEq/L — ABNORMAL LOW (ref 3.5–5.1)
Sodium: 139 mEq/L (ref 135–145)
Total Protein: 5.9 g/dL — ABNORMAL LOW (ref 6.0–8.3)
Total Protein: 6.7 g/dL (ref 6.0–8.3)

## 2010-07-06 LAB — PHOSPHORUS: Phosphorus: 3.3 mg/dL (ref 2.3–4.6)

## 2010-07-06 LAB — POCT I-STAT 3, VENOUS BLOOD GAS (G3P V)
O2 Saturation: 47 %
TCO2: 27 mmol/L (ref 0–100)
pCO2, Ven: 38.5 mmHg — ABNORMAL LOW (ref 45.0–50.0)
pH, Ven: 7.441 — ABNORMAL HIGH (ref 7.250–7.300)

## 2010-07-06 LAB — POCT I-STAT, CHEM 8
BUN: 17 mg/dL (ref 6–23)
Calcium, Ion: 1.01 mmol/L — ABNORMAL LOW (ref 1.12–1.32)
Chloride: 99 mEq/L (ref 96–112)
Creatinine, Ser: 1.3 mg/dL (ref 0.4–1.5)
Glucose, Bld: 127 mg/dL — ABNORMAL HIGH (ref 70–99)
HCT: 55 % — ABNORMAL HIGH (ref 39.0–52.0)
Potassium: 3.3 mEq/L — ABNORMAL LOW (ref 3.5–5.1)

## 2010-07-06 LAB — CK TOTAL AND CKMB (NOT AT ARMC)
CK, MB: 1 ng/mL (ref 0.3–4.0)
Relative Index: INVALID (ref 0.0–2.5)

## 2010-07-06 LAB — CULTURE, BLOOD (ROUTINE X 2)

## 2010-07-06 LAB — CARDIAC PANEL(CRET KIN+CKTOT+MB+TROPI)
CK, MB: 1 ng/mL (ref 0.3–4.0)
Relative Index: INVALID (ref 0.0–2.5)
Relative Index: INVALID (ref 0.0–2.5)
Total CK: 51 U/L (ref 7–232)
Troponin I: 0.02 ng/mL (ref 0.00–0.06)
Troponin I: 0.06 ng/mL (ref 0.00–0.06)

## 2010-07-06 LAB — BRAIN NATRIURETIC PEPTIDE
Pro B Natriuretic peptide (BNP): 1303 pg/mL — ABNORMAL HIGH (ref 0.0–100.0)
Pro B Natriuretic peptide (BNP): 1333 pg/mL — ABNORMAL HIGH (ref 0.0–100.0)
Pro B Natriuretic peptide (BNP): 977 pg/mL — ABNORMAL HIGH (ref 0.0–100.0)

## 2010-07-06 LAB — TROPONIN I: Troponin I: 0.03 ng/mL (ref 0.00–0.06)

## 2010-07-06 LAB — HEPARIN LEVEL (UNFRACTIONATED)
Heparin Unfractionated: 0.33 IU/mL (ref 0.30–0.70)
Heparin Unfractionated: 0.7 IU/mL (ref 0.30–0.70)

## 2010-07-06 LAB — POCT CARDIAC MARKERS
CKMB, poc: <1 ng/mL — ABNORMAL LOW (ref 1.0–8.0)
Troponin i, poc: <0.05 ng/mL (ref 0.00–0.09)

## 2010-07-06 LAB — HEMOGLOBIN A1C
Hgb A1c MFr Bld: 6.9 % — ABNORMAL HIGH (ref <5.7)
Mean Plasma Glucose: 151 mg/dL — ABNORMAL HIGH (ref <117)

## 2010-07-06 LAB — FOLATE: Folate: >20 ng/mL

## 2010-07-14 ENCOUNTER — Telehealth: Payer: Self-pay | Admitting: Emergency Medicine

## 2010-07-14 DIAGNOSIS — I272 Pulmonary hypertension, unspecified: Secondary | ICD-10-CM

## 2010-07-14 NOTE — Telephone Encounter (Signed)
Pt returned call.  States he is in the lung transplant program at [facility].  They are requiring him to go through [facility].  Pt states Forestine Na has an opening now and requesting an order for this for three times a week.  Dr. Lamonte Sakai is not in the office until April 9.  Pt requesting this be done prior to then because if not "they will drop me from the list."  Dr. Joya Gaskins, will you pls advise if this is ok.

## 2010-07-14 NOTE — Telephone Encounter (Signed)
Order was sent to [facility] for this.  Pt aware.

## 2010-07-14 NOTE — Telephone Encounter (Signed)
Ok to refer to [facility] rehab program

## 2010-07-14 NOTE — Telephone Encounter (Signed)
Left message with pt's wife for him to call office back.

## 2010-07-15 ENCOUNTER — Other Ambulatory Visit: Payer: Self-pay | Admitting: Emergency Medicine

## 2010-07-17 NOTE — Consult Note (Signed)
Summary: [facility] Consult[facility]  [facility]   Imported By: Phillis Knack 07/07/2010 13:50:41  _____________________________________________________________________  External Attachment:    Type:   Image     Comment:   External Document

## 2010-07-24 ENCOUNTER — Encounter: Payer: Self-pay | Admitting: Emergency Medicine

## 2010-07-28 ENCOUNTER — Other Ambulatory Visit: Payer: Self-pay

## 2010-07-28 ENCOUNTER — Other Ambulatory Visit: Payer: Self-pay | Admitting: Emergency Medicine

## 2010-07-28 DIAGNOSIS — I2789 Other specified pulmonary heart diseases: Secondary | ICD-10-CM

## 2010-07-29 ENCOUNTER — Ambulatory Visit (INDEPENDENT_AMBULATORY_CARE_PROVIDER_SITE_OTHER): Payer: BC Managed Care – PPO | Admitting: Emergency Medicine

## 2010-07-29 ENCOUNTER — Encounter: Payer: Self-pay | Admitting: Emergency Medicine

## 2010-07-29 VITALS — BP 100/54 | HR 92 | Temp 98.0°F | Ht 68.0 in | Wt 182.6 lb

## 2010-07-29 DIAGNOSIS — I2789 Other specified pulmonary heart diseases: Secondary | ICD-10-CM

## 2010-07-29 DIAGNOSIS — I272 Pulmonary hypertension, unspecified: Secondary | ICD-10-CM

## 2010-07-29 LAB — POCT I-STAT 3, ART BLOOD GAS (G3+)
TCO2: 23 mmol/L (ref 0–100)
pCO2 arterial: 36.3 mmHg (ref 35.0–45.0)
pH, Arterial: 7.389 (ref 7.350–7.450)

## 2010-07-29 LAB — COMPREHENSIVE METABOLIC PANEL
AST: 23 U/L (ref 0–37)
Albumin: 3.4 g/dL — ABNORMAL LOW (ref 3.5–5.2)
Alkaline Phosphatase: 38 U/L — ABNORMAL LOW (ref 39–117)
Chloride: 106 mEq/L (ref 96–112)
GFR calc Af Amer: >60 mL/min (ref >60)
Potassium: 5.1 mEq/L (ref 3.5–5.1)
Total Bilirubin: 0.9 mg/dL (ref 0.3–1.2)

## 2010-07-29 LAB — PROTIME-INR
INR: 1.1 (ref 0.00–1.49)
Prothrombin Time: 14.8 seconds (ref 11.6–15.2)

## 2010-07-29 NOTE — Patient Instructions (Signed)
We will refer you to GI to discuss colonoscopy Continue your inhaled medications and oxygen Continue your Tyvaso and Letaris.  We will check your bloodwork next visit We will order a 15L/min regulator and a cart for 2 O2 tanks from [facility] Follow up with Dr Lamonte Sakai in 2 months or earlier if needed

## 2010-07-29 NOTE — Assessment & Plan Note (Signed)
Chronic pred

## 2010-07-29 NOTE — Assessment & Plan Note (Signed)
Transplant eval underway at [facility]:  - needs CSY (for elevated CEA) - needs random nicotine and EtOH screening with results to [facility]  Continue Tyvaso + Letaris + high flow O2 Rx COPD

## 2010-07-29 NOTE — Progress Notes (Signed)
  Subjective:    Patient ID: [name], male    DOB: 1950-05-10, 60 y.o.   MRN: [id number]  HPI 60 yo man, former smoker with COPD, hx psoriatic arthritis and associated ILD, profound hypoxemia and associated Pulm HTN (PAP 57/26 mean 37, PAOP 14/11 mean 7 by cath 10/04/09. 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 Admitted 6/21-27 with resp failure and decompensated R heart failure. He was diuresed, treated with corticosteroids and started on bronchodilators for his COPD. O2 was titrated to 5L/min at rest, he is leaving it on 5 with exertion.   ROV 02/05/10 -- 60yo, PAH with Hx psoriatic arthritis, ILD and R heart failure, hypoxemia, on chronic pred 14m once daily. Also hx COPD.  On letaris since September '11. Since last time breathing may be a bit better. He has good days and bad days. Rarely uses ventolin. No HA, no syncope or near syncope. LE edema has been well controlled and stable. Was desaturated while walking today on presentation. Has been gaining some wt, aldactone was decreased by Dr SElisabeth Caraa month ago. PFTs done today.   ROV 04/16/10 -- Hx as above, follows up after TTE and 6 min walk. Returns feeling a bit more SOB w exertion. 6 min walk distance = 1649massoc with significant desat to 75% (after 2 min). Estimated RVSP 7963m(up from 59 in 09/2009). Has been on letaris since September. Edema seems better on lasix 63m71mo times a day. Clearly both TTE and walk distance are worse.   ROV 05/19/10 -- returns for f/u of the above issues. last time we started Tyvaso in addition to his letaris. Has been on it for a month. Up to 9 puffs 4x a day. Has been assoc with some HA, occas cough right after doing it, not intrusive. On Pred 10mg55miriva + ventoilin as needed. He is not noticing any big changes in his breathing yet on the Tyvaso. Monthly LFT's normal 1/17.   ROV 07/29/10 -- COPD, psoriatic arthritis + ILD, PAH. Regimen is Tyvaso + Letaris. [facility] They have told him he needs screening CSY, EtOH and tobacco random screening, counseling. Tells me today that his breathing is stable, although he has had trouble w allergies and w the pollen. Taking spiriva + SABA. O2 is set on 6L/min via oximizer.   Review of Systems     Objective:   Physical Exam Gen: Pleasant, hyperpigmented face, no distress  ENT: No lesions,  mouth clear,  oropharynx clear, no postnasal drip, throat clearing  Neck: No JVD, no TMG, no carotid bruits  Lungs: No use of accessory muscles, no dullness to percussion, clear without rales or rhonchi  Cardiovascular: RRR, heart sounds normal, no murmur or gallops, no peripheral edema  Musculoskeletal: No deformities, no cyanosis or clubbing  Neuro: alert, non focal  Skin: Warm, no lesions or rashes      Assessment & Plan:

## 2010-07-29 NOTE — Assessment & Plan Note (Signed)
Spiriva + SABA prn

## 2010-08-01 ENCOUNTER — Other Ambulatory Visit: Payer: Self-pay | Admitting: Emergency Medicine

## 2010-08-01 ENCOUNTER — Telehealth: Payer: Self-pay | Admitting: Emergency Medicine

## 2010-08-01 ENCOUNTER — Other Ambulatory Visit (INDEPENDENT_AMBULATORY_CARE_PROVIDER_SITE_OTHER): Payer: BC Managed Care – PPO

## 2010-08-01 DIAGNOSIS — I2789 Other specified pulmonary heart diseases: Secondary | ICD-10-CM

## 2010-08-01 LAB — HEPATIC FUNCTION PANEL
ALT: 37 U/L (ref 0–53)
AST: 31 U/L (ref 0–37)
Bilirubin, Direct: 0.1 mg/dL (ref 0.0–0.3)
Total Bilirubin: 0.9 mg/dL (ref 0.3–1.2)

## 2010-08-01 NOTE — Telephone Encounter (Signed)
Pt returned phone call and I advised pt that appt has been scheduled with Dr. Fuller Plan on Monday 08/04/10. Pt stated that he was unable to keep this appt. Pt was transferred up to GI to R/S this appt to a time that is more suitable for him. Pt is aware that records have been requested from [facility] and will be faxed directly to GI.

## 2010-08-04 ENCOUNTER — Encounter (HOSPITAL_COMMUNITY): Payer: BC Managed Care – PPO | Attending: Emergency Medicine

## 2010-08-04 ENCOUNTER — Ambulatory Visit: Payer: BC Managed Care – PPO | Admitting: Gastroenterology

## 2010-08-04 DIAGNOSIS — J449 Chronic obstructive pulmonary disease, unspecified: Secondary | ICD-10-CM | POA: Insufficient documentation

## 2010-08-04 DIAGNOSIS — J4489 Other specified chronic obstructive pulmonary disease: Secondary | ICD-10-CM | POA: Insufficient documentation

## 2010-08-05 ENCOUNTER — Encounter: Payer: Self-pay | Admitting: Emergency Medicine

## 2010-08-05 ENCOUNTER — Telehealth: Payer: Self-pay | Admitting: *Deleted

## 2010-08-05 LAB — ETHANOL

## 2010-08-05 NOTE — Telephone Encounter (Signed)
Kirsten with [facility] [facility].  States order was put in for ethanol test but lab sent her speciman for alcholol test instead.  States they sent a red top and this should have been a different tube.  States they can do the alcholol test which will be similar to the ethanol but will need an ok for this.  States the difference will be a % rather than a # for the results.  Advised we will call back once we have clarification from [place] regarding this.  [name] paged Dr. Lamonte Sakai to see what he wants to do.

## 2010-08-05 NOTE — Telephone Encounter (Signed)
Per Dr. Lamonte Sakai, if our lab made the mistake blood should be redrawn correctly at no charge to the patient for ethanol test. I spoke with Elnita Maxwell at [facility] and this test should be drawn in serum gel (separator) tube and tape placed across the top. Per Janace Hoard needs to contact our lab directly about correcting the error. Kirsten said she spoke with Olegario Shearer at the [place] [facility] lab and they are aware of the situation. Per Olegario Shearer, they will call the patient to explain the error and have him come back in for redraw.

## 2010-08-06 ENCOUNTER — Other Ambulatory Visit: Payer: BC Managed Care – PPO

## 2010-08-06 ENCOUNTER — Encounter (HOSPITAL_COMMUNITY): Payer: BC Managed Care – PPO

## 2010-08-06 ENCOUNTER — Other Ambulatory Visit: Payer: Self-pay | Admitting: Emergency Medicine

## 2010-08-06 DIAGNOSIS — I2789 Other specified pulmonary heart diseases: Secondary | ICD-10-CM

## 2010-08-06 DIAGNOSIS — R0602 Shortness of breath: Secondary | ICD-10-CM

## 2010-08-07 LAB — ETHANOL: Alcohol, Ethyl (B): <10 mg/dL (ref 0–10)

## 2010-08-08 ENCOUNTER — Encounter (HOSPITAL_COMMUNITY): Payer: BC Managed Care – PPO

## 2010-08-11 ENCOUNTER — Encounter (HOSPITAL_COMMUNITY): Payer: BC Managed Care – PPO

## 2010-08-12 ENCOUNTER — Other Ambulatory Visit: Payer: Self-pay | Admitting: Internal Medicine

## 2010-08-12 ENCOUNTER — Other Ambulatory Visit: Payer: Self-pay | Admitting: Emergency Medicine

## 2010-08-13 ENCOUNTER — Encounter (HOSPITAL_COMMUNITY): Payer: BC Managed Care – PPO

## 2010-08-13 ENCOUNTER — Telehealth: Payer: Self-pay | Admitting: Emergency Medicine

## 2010-08-13 ENCOUNTER — Ambulatory Visit (HOSPITAL_COMMUNITY): Payer: BC Managed Care – PPO

## 2010-08-13 NOTE — Telephone Encounter (Signed)
Forwarded to Dr. Lucio Edward for review.

## 2010-08-15 ENCOUNTER — Encounter (HOSPITAL_COMMUNITY): Payer: BC Managed Care – PPO

## 2010-08-15 ENCOUNTER — Ambulatory Visit (HOSPITAL_COMMUNITY): Payer: BC Managed Care – PPO

## 2010-08-18 ENCOUNTER — Encounter (HOSPITAL_COMMUNITY): Payer: BC Managed Care – PPO

## 2010-08-18 ENCOUNTER — Ambulatory Visit (HOSPITAL_COMMUNITY): Payer: BC Managed Care – PPO

## 2010-08-20 ENCOUNTER — Encounter (HOSPITAL_COMMUNITY): Payer: BC Managed Care – PPO

## 2010-08-20 ENCOUNTER — Ambulatory Visit (HOSPITAL_COMMUNITY): Payer: BC Managed Care – PPO

## 2010-08-20 ENCOUNTER — Encounter (HOSPITAL_COMMUNITY): Payer: BC Managed Care – PPO | Attending: Emergency Medicine

## 2010-08-20 DIAGNOSIS — J4489 Other specified chronic obstructive pulmonary disease: Secondary | ICD-10-CM | POA: Insufficient documentation

## 2010-08-20 DIAGNOSIS — J449 Chronic obstructive pulmonary disease, unspecified: Secondary | ICD-10-CM | POA: Insufficient documentation

## 2010-08-22 ENCOUNTER — Encounter (HOSPITAL_COMMUNITY): Payer: BC Managed Care – PPO

## 2010-08-22 ENCOUNTER — Ambulatory Visit (HOSPITAL_COMMUNITY): Payer: BC Managed Care – PPO

## 2010-08-22 ENCOUNTER — Other Ambulatory Visit: Payer: Self-pay | Admitting: Emergency Medicine

## 2010-08-22 DIAGNOSIS — I2789 Other specified pulmonary heart diseases: Secondary | ICD-10-CM

## 2010-08-25 ENCOUNTER — Encounter (HOSPITAL_COMMUNITY): Payer: BC Managed Care – PPO

## 2010-08-25 ENCOUNTER — Other Ambulatory Visit: Payer: Self-pay

## 2010-08-25 ENCOUNTER — Ambulatory Visit (HOSPITAL_COMMUNITY): Payer: BC Managed Care – PPO

## 2010-08-25 ENCOUNTER — Other Ambulatory Visit: Payer: Self-pay | Admitting: Emergency Medicine

## 2010-08-25 DIAGNOSIS — I2789 Other specified pulmonary heart diseases: Secondary | ICD-10-CM

## 2010-08-27 ENCOUNTER — Encounter (HOSPITAL_COMMUNITY): Payer: BC Managed Care – PPO

## 2010-08-27 ENCOUNTER — Ambulatory Visit (HOSPITAL_COMMUNITY): Payer: BC Managed Care – PPO

## 2010-08-29 ENCOUNTER — Encounter (HOSPITAL_COMMUNITY): Payer: BC Managed Care – PPO

## 2010-08-29 ENCOUNTER — Ambulatory Visit (HOSPITAL_COMMUNITY): Payer: BC Managed Care – PPO

## 2010-09-01 ENCOUNTER — Ambulatory Visit (HOSPITAL_COMMUNITY): Payer: BC Managed Care – PPO

## 2010-09-01 ENCOUNTER — Encounter (HOSPITAL_COMMUNITY): Payer: BC Managed Care – PPO

## 2010-09-02 ENCOUNTER — Other Ambulatory Visit (INDEPENDENT_AMBULATORY_CARE_PROVIDER_SITE_OTHER): Payer: BC Managed Care – PPO

## 2010-09-02 DIAGNOSIS — I2789 Other specified pulmonary heart diseases: Secondary | ICD-10-CM

## 2010-09-02 LAB — HEPATIC FUNCTION PANEL
Alkaline Phosphatase: 33 U/L — ABNORMAL LOW (ref 39–117)
Bilirubin, Direct: 0.1 mg/dL (ref 0.0–0.3)

## 2010-09-02 NOTE — Assessment & Plan Note (Signed)
OFFICE VISIT   Derek, Anderson  DOB:  17-Mar-1951                                       01/17/2008  TDHRC#:[id number]   I saw the patient in the office today for continued followup of his  peripheral vascular disease.  I last saw him in March of 2008.  Since I  saw him last he continues to have stable claudication in both calves.  He can walk approximately half a mile before experiencing symptoms.  His  symptoms are relieved with rest.  His symptoms are essentially the same  in both lower extremities.  They have remained relatively stable over  the last year and a half.  He denies any history of rest pain and he has  had no history of nonhealing wounds.   SOCIAL HISTORY:  He does continue to smoke but has cut back to about two  cigarettes a day.   PAST MEDICAL HISTORY:  His past medical history is unchanged except that  he has been diagnosed with idiopathic pulmonary fibrosis.   REVIEW OF SYSTEMS:  On review of systems he does admit to dyspnea on  exertion.  He has had no recent productive cough, bronchitis, asthma or  wheezing.  He has had no recent chest pain, chest pressure, palpitations  or arrhythmias.   PHYSICAL EXAMINATION:  General:  On physical examination this is a  pleasant 60 year old gentleman who appears his stated age.  Vital signs:  Blood pressure 137/75, heart rate is 79.  Neck:  Neck is supple.  There  is no cervical lymphadenopathy.  I do not detect any carotid bruits.  Lungs:  Clear bilaterally to auscultation.  Cardiac:  He has a regular  rate and rhythm.  Abdomen:  Soft and nontender.  He has palpable femoral  pulses.  I cannot palpate popliteal or pedal pulses on either side.  He  has monophasic Doppler signals in both feet.  He has no ischemic ulcers.  He has no significant lower extremity swelling.  Neurologic:  Exam is  nonfocal.   Doppler study in our office today shows an ABI of 79% on the right and  100% on the left.   However, I feel these ABIs are falsely elevated as he  likely has significant calcific disease.   The patient has evidence of bilateral infrainguinal arterial occlusive  disease.  His symptoms have remained stable.  His ABIs are increased  bilaterally but however I think this is related to calcific disease and  these are likely not accurate.  He understands that we generally would  not consider arteriography and revascularization unless he develops  disabling claudication or rest pain or a nonhealing ulcer.  I plan on  seeing him back in 18 months.  We have again discussed the importance of  getting off tobacco completely.  I have also encouraged him to continue  walking as much as possible.  He does try to walk at least every other  day.   Judeth Cornfield. Scot Dock, M.D.  Electronically Signed   CSD/MEDQ  D:  01/17/2008  T:  01/18/2008  Job:  [id number]   cc:   Lynnell Chad. Shelia Media, M.D.

## 2010-09-02 NOTE — Assessment & Plan Note (Signed)
OFFICE VISIT   [name], [name]  DOB:  April 15, 1951                                       03/19/2010  TXMIW#:[id number]   ESTABLISHED PATIENT VISIT LEVEL 3   I saw the patient in the office today for continued followup of his  peripheral vascular disease.  I had last seen him in September 2009.  He  was seen by Myra Gianotti, one of our physician assistants, on July 12, 2009.  He has had stable claudication of both calves and comes in  for his 108-monthfollowup visit.  Since I had seen him last, his  pulmonary status has gradually worsened and he is now on continuous O2  at 5 L.  His activity is very limited because of his pulmonary status.  He denies rest pain although he does have stable claudication of both  lower extremities that mostly involves the calves.  His symptoms are  brought on by ambulation and relieved with rest.  There are no other  aggravating or alleviating factors.  He has had no problems with  nonhealing wounds in his feet.   REVIEW OF SYSTEMS:  He has had no chest pain or chest pressure.  He does  admit to dyspnea with minimal exertion.  He wears O2 continually.   PHYSICAL EXAMINATION:  This is a pleasant [age]year old gentleman who  appears his stated age.  Blood pressure 122/68, heart rate is 85,  saturation is 91%.  He has palpable femoral pulses.  I cannot palpate  pedal pulses.  However, both feet appear adequately perfused.  He has no  ischemic ulcers.   He did have an arterial Doppler study in our office today which shows  monophasic Doppler signals in the dorsalis pedis and posterior tibial  positions bilaterally.  ABI on the right is 87% and on the left 100%.  I  suspect that these are falsely elevated because of calcific disease.   I have explained we would not consider arteriography unless he developed  a limb-threatening situation such as a nonhealing wound.  However, his  vascular status remains stable and it appears  that his pulmonary status  is his primary liability.  I have ordered followup ABIs in 1 year.  I  will plan on seeing him back in 2 years unless there is any change in  his ABIs when he comes back in a year.  Fortunately, he is not a smoker.     CJudeth Cornfield DScot Dock M.D.  Electronically Signed   CSD/MEDQ  D:  03/19/2010  T:  03/20/2010  Job:  [id number]

## 2010-09-02 NOTE — Assessment & Plan Note (Signed)
OFFICE VISIT   [name], [name]  DOB:  April 25, 1950                                       07/12/2009  ZOXWR#:[id number]   REASON FOR VISIT:  Followup of bilateral leg claudication.   Patient is a 60 year old Caucasian male who has been followed by Dr.  Deitra Anderson for peripheral vascular disease.  He was last seen  in September of 2009.  He has calf claudication bilaterally.  At that  time, he was able to walk approximately a half mile before experiencing  symptoms, and symptoms were relieved at rest.  He unfortunately was  recently diagnosed with idiopathic pulmonary fibrosis, which has  significantly limited his activity.  He is only able to walk 1 to 2  blocks due to shortness of breath.  He gets some mild calf tightness but  nothing too severe.  He continues to deny rest pain or foot ulcers.  Other medical history is significant for rheumatoid arthritis,  psoriasis, degenerative disk disease, tobacco abuse but recently quit,  tonsillectomy, sinus surgery, Dupuytren contracture surgery to bilateral  hands , laminectomy in 1999, repair of cecal perforation in 2003 after a  colonoscopy.   FAMILY HISTORY:  Significant for premature peripheral vascular disease  in his mother.   SOCIAL HISTORY:  He is married with 2 children.  He recently quit  smoking since his last visit.  He drinks a 6-pack of beer a day.   REVIEW OF SYSTEMS:  Positive for positive for shortness of breath with  exertion and productive cough, night home oxygen, claudication symptoms  as mentioned above.  He also has a history of right leg DVT and has  generalized arthritic pains.  He also has chronic skin rashes.  He  reports bilateral lower extremity edema, particularly in his ankles for  about 6 months now.  Arterial Doppler study in our office today showed ABI of 0.64 on the  left and 0.73 on the right.  This was stable on the right from his last  visit but more of a dramatic  decrease on the left from his previous  visit.  However, his ABI last visit was found to be greater than 1.0,  but Dr. Scot Anderson felt it was falsely elevated to calcified vessels and  his previous ABI the previous year on the left was 0.64, which is more  consistent with today's reading.   PHYSICAL EXAMINATION FINDINGS:  Heart rate of 101, blood pressure  131/79, respirations 22.  General Appearance:  This shows a pleasant 12-  year-old male who appears his stated age.  He is alert, cooperative in  no acute distress.  Head is normocephalic atraumatic.  He does have a  purplish skin discoloration to his face.  His lung sounds show crackles  in the bases.  His heart had a regular rate and rhythm.  No carotid  bruits were auscultated.  Extremities showed 2+ ankle edema bilaterally.  Abdomen is soft, nontender, nondistended.  Neurologic exam was nonfocal.  Extremities showed monophasic posterior tibial and dorsalis pedis  Doppler signals bilaterally.  No ulcerations were noted.  His toes have  a bluish-purple discoloration which he says is chronic.  There were no  gangrenous changes noted.   Patient's bilateral calf claudication symptoms have remained stable  since his last visit.  Per Dr. Nicole Anderson previous note, there is no  plan  to consider arteriography or revascularization unless his symptoms  worsen.  In regards to his new ankle edema, this could have many  etiologies.  He does report that his symptoms are worse after prolonged  standing and improved with keeping his legs elevated, which could  indicate venous insufficiency.  He is not really having any symptoms  such as venous stasis ulcerations or some compression or leg aching  other than the previously mentioned.  calf claudication.  Follow up with  his primary physician, Dr. Shelia Anderson, if his symptoms, to evaluate for other  possible etiologies.  I did recommend knee-high compression stockings to  see if that would help relieve his  symptoms but also recommended that he  discuss this with his primary physician, Dr. Shelia Anderson, when he sees him  next month, as he may need further evaluation to rule out other causes  of his new onset of lower extremity edema.  I have recommended that he  be seen by Dr. Scot Anderson in 6 months with repeat ABIs since there was a  change from on the left from his previous ABIs, as mentioned above.  If  other workup for his peripheral edema is found to be negative, he can  also be evaluated for lower extremity venous insufficiency at that time  as well.   Derek Shoe, PA   [name], M.D.  Electronically Signed   AWZ/MEDQ  D:  07/12/2009  T:  07/13/2009  Job:  [id number]   cc:   Derek Sizer, MD  Lynnell Chad. [name], M.D.

## 2010-09-02 NOTE — Op Note (Signed)
[name], [name]              ACCOUNT NO.:  [id number]   MEDICAL RECORD NO.:  [id number]          PATIENT TYPE:  AMB   LOCATION:  [place]                          FACILITY:  [facility]   PHYSICIAN:  Youlanda Mighty. Sypher, M.D. DATE OF BIRTH:  04-12-1951   DATE OF PROCEDURE:  09/06/2008  DATE OF DISCHARGE:                               OPERATIVE REPORT   REFERRING PHYSICIAN:  Lynnell Chad. Shelia Media, MD   PREOPERATIVE DIAGNOSES:  Severe Dupuytren diathesis with flexion  contractures of left ring and small fingers with a 15-degree flexion  contracture of the ring finger proximal interphalangeal joint, a 10-  degree flexion contracture of the ring finger metacarpophalangeal joint,  a 45-degree flexion contracture of the small finger metacarpophalangeal  joint, and a 95-degree flexion contracture of the small finger proximal  interphalangeal joint.   [name] Dupuytren disease is complicated by idiopathic pulmonary  fibrosis, severe pulmonary failure, a resting pO2 of 53 on room air and  a history of severe atherosclerotic cardiovascular disease with  bilateral aortoiliac obstruction, status post bypass by [name].   POSTOPERATIVE DIAGNOSES:  Severe Dupuytren diathesis with flexion  contractures of left ring and small fingers with a 15-degree flexion  contracture of the ring finger proximal interphalangeal joint, a 10-  degree flexion contracture of the ring finger metacarpophalangeal joint,  a 45-degree flexion contracture of the small finger metacarpophalangeal  joint, and a 95-degree flexion contracture of the small finger proximal  interphalangeal joint.   OPERATION:  Combination of needle aponeurotomy and limited cord/nodule  resection left ring and small fingers, correcting the ring finger  metacarpophalangeal joint to 0, the ring finger proximal interphalangeal  joint to 5-degree flexion contracture, the small finger  metacarpophalangeal joint to a 5-degree flexion contracture and  the  small finger proximal interphalangeal joint  to a 15-degree flexion  contracture.   OPERATING SURGEON:  Youlanda Mighty. Sypher, MD   ASSISTANT:  [name], PA-C   ANESTHESIA:  Axillary block supplemented by oxygen and no sedation.   SUPERVISING ANESTHESIOLOGIST:  Derek Severe. Tobias Alexander, MD   INDICATIONS:  [name] is a 60 year old retired gentleman, who  is referred through the courtesy of [name] for evaluation and  management of profound Dupuytren diathesis.  In addition to his  Dupuytren contracture predicament, [name] has several very severe  medical predicaments including severe atherosclerotic cardiovascular  disease, idiopathic pulmonary fibrosis, requiring 24-hour a day oxygen  at home, history of psoriatic arthritis, and prior vascular intervention  by [name].   His preoperative examination revealed significant flexion contractures  of the left ring and small fingers, leading to difficulties with self-  care.  He could not wash his face or get his hand flat on the table top  due to the very severe flexion contracture of the small finger.   Preoperatively, x-ray the hand revealed marked calcification of his  radial and ulnar arteries as well as destructive psoriatic arthritis of  multiple interphalangeal joints.   We advised [name] that his combination of severe medical problems,  particularly his pulmonary impairment rendered him  a very poor surgical  risk.  However, with local anesthesia and careful monitoring as well as  oxygen support, we should be able to palliate his contracture so that  his self-care could be facilitated   We advised a combination of needle aponeurotomy and limited cord  release/nodule resection in an effort to relieve his small finger and  ring finger PIP flexion contractures.  The goal will be to assist in his  ability to care for himself in the function at home.  He understands  that he is at increased risk  for infection, possible loss of a finger,  and possible development of postoperative neurologic impairment due to  his severe peripheral vascular disease.  Indeed, he does have  calcification of the named vessels in his upper extremity.   After this detailed informed consent, he is brought to the operating at  this time for palliative surgery.   PROCEDURE:  [name] was brought to the operating room and  placed in supine position upon the operating table.   Following an anesthesia consult by [name], we agreed that an  axillary block would be beneficial in providing a sympathectomy  increasing the blood flow to the left hand and providing surgical  anesthesia.  After informed consent, [name] placed an axillary block  in the holding area without complication.   [name] was brought to room 6 at the [name] and placed  in supine position upon the operating table.  He was not provided  additional sedation.   The left arm was prepped with Betadine soap solution and sterilely  draped.  We initiated the procedure without a tourniquet.  A needle  aponeurotomy was performed on the ulnar aspect of the palm and overlying  the proximal phalangeal segment of the ring finger.  I was able to  relieve the MP flexion contracture of both fingers with needle  aponeurotomy technique.  No pathologic bleeding was encountered.  The  PIP contractures were considerably more recalcitrant; therefore, an  oblique incision was fashioned over the ring finger proximal phalanx and  a meticulous dissection of a central cord and spiral band was  accomplished, relieving the PIP flexion contracture to less than 5  degrees.  The small finger had bridging cords across the PIP and MP  joints on the ulnar aspect of the finger.  A Brunner zig-zag incision  was fashioned with great care, and the pathologic fascia was resected.  Upon resection of the fascia, we were able to decrease the  flexion  contracture of the PIP joint to approximately 15 degrees.  I left the  nodule intact at the PIP joint as I was very worried about undermining  the skin at this level due to a significant peripheral vascular disease.   The tourniquet was released with immediate capillary refill to the  fingers.  I closed the wound with a transverse open palm technique  incision at the base of the small finger.   There was a significant chance that we will have wound healing  complications given Mr. Raider's chronic low pO2 in the 50s, however,  we have clearly improved his finger position to facilitate self-care and  improve his quality of life.   There were no apparent complications.  For aftercare, he was provided  prescriptions for Percocet 5 mg 1 p.o. q.4-6 h. p.r.n. pain, 30 tablets  without refill.  Also, Keflex 500 mg 1 p.o. q.8 h. x4 days as  prophylactic antibiotic.  He was  provided 1 g of Ancef as a preoperative prophylactic antibiotic prior to  the initiation of our incision.  He also takes chronic Coumadin  anticoagulation due to his difficulty with peripheral vascular disease.  He was instructed to resume his oral Coumadin this afternoon.      Youlanda Mighty Sypher, M.D.  Electronically Signed     RVS/MEDQ  D:  09/06/2008  T:  09/07/2008  Job:  [id number]   cc:   [name], M.D.

## 2010-09-03 ENCOUNTER — Encounter (HOSPITAL_COMMUNITY): Payer: BC Managed Care – PPO

## 2010-09-03 ENCOUNTER — Ambulatory Visit (HOSPITAL_COMMUNITY): Payer: BC Managed Care – PPO

## 2010-09-05 ENCOUNTER — Ambulatory Visit (HOSPITAL_COMMUNITY): Payer: BC Managed Care – PPO

## 2010-09-05 ENCOUNTER — Encounter (HOSPITAL_COMMUNITY): Payer: BC Managed Care – PPO

## 2010-09-05 NOTE — Op Note (Signed)
[place]. [facility]  Patient:    GLENDEN, ROSSELL Visit Number: [id number] MRN: [id number]          Service Type: MED Location: [phone] Attending Physician:  Gwenyth Ober Dictated by:   Judeth Horn, M.D. Proc. Date: 10/19/01 Admit Date:  10/18/2001                             Operative Report  PREOPERATIVE DIAGNOSIS:  Perforated viscus, status post colonoscopy.  POSTOPERATIVE DIAGNOSIS:  Perforated cecum with local contamination and peritonitis.  PROCEDURE:  Repair of cecal perforation.  SURGEON:  Judeth Horn, M.D.  ASSISTANT:  Isabel Caprice. Hassell Done, M.D.  ANESTHESIA:  General endotracheal.  ESTIMATED BLOOD LOSS:  Less than 20 cc.  COMPLICATIONS:  None.  CONDITION STABLE.  INDICATION FOR OPERATION:  The patient is a 60 year old gentleman on steroids who had undergone a colonoscopy yesterday with a biopsy, who came in today with free air and peritonitis.  FINDINGS:  The patient had a 1-1.5 cm hole in the cecum in the lateral posterior wall.  The appendix was retrocecal.  There was local contamination with particulate matter, green in color.  DESCRIPTION OF PROCEDURE:  The patient was taken to the operating room and placed on the table in the supine position.  After an adequate endotracheal anesthetic was administered, he was prepped and draped in the usual sterile manner, exposing the midline of the abdomen.  A #10 blade was used to make a midline incision approximately 3-4 cm above the umbilicus down to 4-5 cm below.  It was taken down to and through the midline fascia using electrocautery.  Once into the peritoneal cavity, there was a greenish particulate fluid in the peritoneal cavity.  We aspirated most of this out with a disposable pool-tip sucker.  We could easily isolate the hole in the lateral wall of the cecum.  It was in the free peritoneal cavity.  We isolated this area and then subsequently closed it using a TA-55 stapler  with 3.5 mm staples.  We resected a small portion of the cecum with this procedure. That was sent for specimen.  Subsequent to this repair we ran the small bowel from the ligament of Treitz down to the terminal ileum and found there to be no other evidence of injury. The sigmoid colon appeared to be normal.  We irrigated with copious amounts of warm saline solution.  Subsequently we closed the abdomen using a running #1 PDS suture for the fascia, two #5 Ethibond sutures were used as retention sutures with plastic bridges on top of that.  We loosely reapproximated the skin using stainless steel staples.  All needle counts, sponge counts, and instrument counts were correct. Dictated by:   Judeth Horn, M.D. Attending Physician:  Gwenyth Ober DD:  10/19/01 TD:  10/22/01 Job: [id number]

## 2010-09-05 NOTE — Procedures (Signed)
[place]. [facility]  Patient:    [name], [name] Visit Number: [id number] MRN: [id number]          Service Type: OBV Location: [id number] Attending Physician:  Jim Desanctis Dictated by:   Jim Desanctis, M.D. Proc. Date: 10/18/01 Admit Date:  10/18/2001                             Procedure Report  PROCEDURE PERFORMED:  Upper endoscopy.  ENDOSCOPIST:  Jim Desanctis, M.D.  INDICATIONS FOR PROCEDURE:  Hemoccult positivity.  ANESTHESIA:  Demerol 30 mg, Versed 5 mg.  DESCRIPTION OF PROCEDURE:  With the patient mildly sedated in the left lateral decubitus position, the Olympus video endoscope was inserted in the mouth and passed under direct vision through the esophagus.  The distal esophagus was approached and showed Barretts, photographed and biopsied.  We entered into the stomach.  The fundus, body, antrum, duodenal bulb and second portion of the duodenum all appeared normal.  From this point, the endoscope was slowly withdrawn taking circumferential views of the entire duodenal mucosa until the endoscope had been pulled back into the stomach and placed on retroflexion to view the stomach from below.  The endoscope was then straightened and withdrawn taking circumferential views of the remaining gastric and esophageal mucosa.  Patients vital signs and pulse oximeter remained stable.  The patient tolerated the procedure well without apparent complications.  FINDINGS:  Changes of Barretts esophagus.  Await biopsy report.  The patient will call me for results and follow up with me as an outpatient.  Proceed to colonoscopy as planned. Dictated by:   Jim Desanctis, M.D. Attending Physician:  Jim Desanctis DD:  10/18/01 TD:  10/19/01 Job: [id number]

## 2010-09-05 NOTE — Op Note (Signed)
[place]. [facility]  Patient:    [name], [name] Visit Number: [id number] MRN: [id number]          Service Type: OBV Location: [place] 05 Attending Physician:  Jim Desanctis Dictated by:   Jim Desanctis, M.D. Proc. Date: 10/18/01 Admit Date:  10/18/2001                             Operative Report  PROCEDURE: Colonoscopy.  SURGEON:  Jim Desanctis, M.D.  INDICATIONS:  Hemoccult positivity.  ANESTHESIA: Demerol 10 mg and Versed 5 mg.  DESCRIPTION OF PROCEDURE: With the patient mildly sedated in the left lateral decubitus position, a rectal exam was performed which was unremarkable. Subsequently, the Olympus videoscopic colonoscope was inserted into the rectum and passed under direct vision of the cecum identified by the ileocecal valve and appendiceal orifice. There was a small polyp in the cecum which was removed using hot biopsy forceps technique at a setting of 20/20 blended current.  The endoscope was then withdrawn taking several more views in the remaining colonic mucosa, stopping to photograph diverticula seen along the way in the sigmoid colon until we reached the rectum which appeared normal on direct view and showed hemorrhoids on retroflex view. The endoscope was straightened and withdrawn.  The patients vital signs remained stable.  The patient tolerated the procedure well without apparent complications.  FINDINGS: 1. Internal hemorrhoids. 2. Diverticulosis of sigmoid colon. 3. Polyp of cecum, awaiting biopsy report.  DISPOSITION: The patient will call for the results and follow-up with me as an outpatient. Dictated by:   Jim Desanctis, M.D. Attending Physician:  Jim Desanctis DD:  10/18/01 TD:  10/19/01 Job: [id number] IV/[id number]

## 2010-09-05 NOTE — H&P (Signed)
[place]. [facility]  Patient:    [name], [name]                       MRN: [id number] Adm. Date:  06/07/00 Attending:  Lynnell Chad. Shelia Media, M.D. Dictator:   Kemper Durie, P.A. CC:         Jene Every, M.D.   History and Physical  DATE OF BIRTH:  1951-01-11  CHIEF COMPLAINT:  "I feel tired and weak."  HISTORY OF PRESENT ILLNESS:  [name] is a 60 year old married Caucasian patient of Dr. Jene Every who presents to the office this morning complaining of fatigue and sinus congestion.  This is day #5 of an illness which started with vomiting and diarrhea and progressed to some sinus and chest congestion accompanied by fevers and chills.  He has had no vomiting or diarrhea in the last 48 hours but is increasingly fatigued and has continued to have the fevers and chills.  His past medical history is significant for rheumatoid arthritis and psoriasis and he is currently on prednisone 20 mg daily from Dr. Crista Luria for control of his psoriasis.  He has a past history also of COPD, currently smokes 1-2 cigarettes daily.  He denies having any chest pain or acute shortness of breath or any history of coronary artery disease.  PAST MEDICAL HISTORY:  Rheumatoid arthritis, psoriasis, degenerative disk disease, hyperlipidemia, chronic tobacco use, and chronic alcohol use.  PAST SURGICAL HISTORY:  Significant for tonsillectomy and adenoidectomy, sinus surgery, surgery on his right hand for Dupuytrens contracture, and degenerative disk disease and laminectomy in July 1999.  MEDICAL ALLERGIES:  None.  CURRENT MEDICATIONS: 1. Prednisone 20 mg alternating with 15 mg daily. 2. Calcium 600 mg daily. 3. Multivitamin daily. 4. Vioxx 50 mg daily. 5. B complex vitamin daily. 6. Zinc 60 mg daily. 7. Zyrtec 10 mg daily.  FAMILY HISTORY:  Mother with history of type 2 diabetes, coronary artery disease, hypertension, and arthritis.  He has a brother with  type 2 diabetes as well.  Fathers health is generally good.  There is no history of colon cancer or prostate cancer in the family.  SOCIAL HISTORY:  Patient lives in [place].  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  He is married.  He smokes currently only 1-2 cigarettes daily but has about a 50 pack-year history.  He also drinks about a six pack of beer on a daily basis.  REVIEW OF SYSTEMS:  Positive for fevers and chills.  He denies any new rashes or any synovitis.  He has had some mild headache and sore throat.  He has had no acute chest pain or any shortness of breath or PND.  He denies any dizziness and symptoms or visual changes.  In the last 48 hours he has had no vomiting or diarrhea and has been able to hold down fluids.  He denies any hematemesis or any hematochezia.  He has had no increased swelling.  He has had no swelling or any other associated symptoms.  PHYSICAL EXAMINATION:  VITAL SIGNS:  His weight today is 160.  Pulse 106, respirations 26, temperature 97.2, blood pressure 110/76.  Oxygen saturation on room air is 90%.  GENERAL:  Patient is a well-nourished, well-developed 60 year old white male who appears slightly flushed in no acute distress.  SKIN:  He has dry skin globally with some erythematous patches noted primarily on the extensor surfaces of his forearms.  He  also has psoriatic changes to his nails and bilaterally on his hands.  LYMPH:  He has no cervical or supraclavicular adenopathy noted.  HEENT:  His TMs are slightly injected bilaterally with no erythema or air-fluid levels noted.  His oropharynx shows a white coating on his tongue. Oropharynx is slightly red with no exudate.  External eyes appear normal.  He is wearing glasses.  NECK:  Reveals no thyromegaly, is supple and nonrigid.  LUNGS:  Respiratory rate is slightly elevated at 26.  His respirations are slightly shallow but there are no retractions or accessory muscle use  noted. He has bibasilar crackles bilaterally, especially at the bases.  CARDIOVASCULAR:  He is slightly tachycardic with a pulse about 106, normal S1, S2. I hear no murmur, rub, or gallop.  ABDOMEN:  He is soft, nontender, nondistended.  RECTAL:  Deferred.  EXTREMITIES:  Show no dependent edema.  Peripheral pulses are +3 at the radial sites, +2 at the dorsal pedal sites bilaterally.  LABORATORY STUDIES:  Show an elevated white cell count of 16.2 with a shift to the left of 84% neutrophils.  Chest x-ray does reveal bibasilar pneumonia but there are no old chest films for comparison.  ASSESSMENT AND PLAN: 1. Bilateral pneumonia.  Chest x-ray is reviewed with Dr. Shelia Media.  He will be    admitted for observation, stabilization, and O2 support following blood    cultures.  He will be placed on Unasyn and also his intake and output    will be monitored. 2. Chronic steroid use with immunosuppression. 3. History of rheumatoid arthritis. 4. Psoriasis.  We will continue his prednisone at 10 mg daily. 5. Chronic obstructive pulmonary disease with history of tobacco use.  Patient    currently smokes 1-2 cigarettes a day. 6. History of alcohol use. 7. Oral thrush.  We will start some nystatin today.  Please see the admission orders for a complete listing of orders.DD:  06/07/00 TD:  06/07/00 Job: [id number]

## 2010-09-05 NOTE — Consult Note (Signed)
[place]. [facility]  Patient:    [name] Visit Number: [id number] MRN: [id number]          Service Type: MED Location: [phone] Attending Physician:  Gwenyth Ober Dictated by:   Judeth Horn, M.D. Proc. Date: 10/19/01 Admit Date:  10/18/2001                            Consultation Report  REASON FOR CONSULTATION:  Thank you very much for asking me to see this patient who is a 60 year old gentleman with free air status post colonoscopy with polypectomy.  The patient apparently started developing abdominal distention and pain yesterday after a cecal polypectomy and colonoscopy for routine screening.  CT scan this morning demonstrated free air of the abdomen and he is now to be taken to surgery for repair,  possible resection and ileostomy.  PAST MEDICAL HISTORY: 1. Rheumatoid arthritis. 2. Chronic obstructive pulmonary disease. 3. Status post pneumonia in the past. 4. Back surgery before. 5. Skin disease, perhaps psoriasis.  MEDICATIONS: 1. Vioxx. 2. Prednisone. 3. Dapsone. 4. Coumadin.  In spite of the Coumadin, the patients INR is close to being normal at 1.4.  He has had no previous abdominal surgery.  PHYSICAL EXAMINATION: On exam he has a distended abdomen with hypoactive bowel sounds, diffuse tenderness and peritonitis.  CT scan demonstrates free air.  EKG is within normal limits.  Chest x-ray also demonstrates free air.  IMPRESSION: The patient has a perforated cecum status post polypectomy.  PLAN: Repair this without the need for resection and ileostomy, however, depending upon the degree of contamination and considering that the patient is on steroids chronically, he may require resection and/or diverting ileostomy. The decision will be made at the time of surgery.  Preoperatively, he will get stress doses of steroids which is already received, Cefotan and Flagyl. Dictated by:   Judeth Horn, M.D. Attending  Physician:  Gwenyth Ober DD:  10/19/01 TD:  10/21/01 Job: [id number]

## 2010-09-05 NOTE — Consult Note (Signed)
[place]. [facility]  Patient:    [name], [name] Visit Number: [id number] MRN: [id number]          Service Type: END Location: ENDO Attending Physician:  Myrtis Hopping Dictated by:   Burnett Harry Joya Gaskins, M.D. [facility] Proc. Date: 10/25/01 Admit Date:  10/18/2001   CC:         Judeth Horn, M.D.  Jim Desanctis, M.D.  Lynnell Chad. Shelia Media, M.D.   Consultation Report  CHIEF COMPLAINT:  Hypoxemia.  HISTORY OF PRESENT ILLNESS:  [age]-year-old white male admitted on October 18, 2001 following a colonoscopy.  He was in the emergency room complaining of increased bilateral lower quadrant abdominal pain.  He had his colonoscopy on October 18, 2001 and the symptoms progressed to October 19, 2001; he was admitted on October 19, 2001, coming in real late in the evening on October 18, 2001.  The patient ultimately was found to have a perforated cecum; he underwent exploratory laparotomy on October 19, 2001 at 1240 hours.  He had evidence of local contamination and peritonitis.  Postoperatively, the patient has done fairly well with decreasing abdominal distention.  The patient does have a long-standing history of psoriatic arthritis with significant skin involvement and joint involvement in the hands and feet.  The patient has also had a history of labeled as being chronic lung disease.  He smokes 10 cigarettes a day but smoked more than this for 35+ years.  He has had pneumonia in the past and has been labeled as having chronic obstructive lung disease and also been told he has had pulmonary fibrotic changes as well, has been followed by his primary care physician for this and has not had a pulmonary referral up until now.  He has also had deep venous thrombosis in the past and is on chronic Coumadin therapy as an outpatient, also on chronic steroids and Dapsone therapy for his psoriatic arthritis; he takes 15 mg daily on the prednisone and has been on this dose for some time.  Postop,  the patient has had increasing shortness of breath with exertion, notes a saturation down to 68% on room air with minimal exertion.  He denies any significant cough; no real chest pain, no fever or chills; does have some occasional night sweats; no reflux symptomatology.  The patient is referred now for further evaluation regarding his pulmonary fibrosis.  PAST MEDICAL HISTORY:  Medical:  History of the psoriatic arthritis as noted above, pneumonia, history of colon polyps, history of back surgery, history of chronic obstructive lung disease, rheumatoid arthritis.  CURRENT MEDICATIONS: 1. Prednisone 15 mg a day. 2. Primaxin 500 mg q.8h. 3. Pepcid 20 mg q.12h.  The patient is really on no prophylactic DVT therapy at this time.  MEDICATIONS PRIOR TO ADMISSION:  Vioxx, prednisone, Dapsone and Coumadin.  REVIEW OF SYSTEMS:  Otherwise noncontributory.  FAMILY HISTORY:  Otherwise noncontributory.  SOCIAL HISTORY:  Otherwise noncontributory.  ALLERGIES:  None.  HABITS:  He drinks three to four alcoholic beverages a day, does actively smoke.  PHYSICAL EXAMINATION:  VITAL SIGNS:  Temperature of 100 to 99 degrees; blood pressure 127/59; pulse 80; respirations 20; saturation 91% on 4 L, on room air drops to 68%.  CHEST:  Bibasilar and diffuse dry rales.  No evidence of wheeze.  CARDIAC:  Exam showed a regular rate and rhythm with S3.  Normal S1 and S2.  ABDOMEN:  Soft, nontender.  Bowel sounds active.  EXTREMITIES:  No edema or clubbing.  NEUROLOGIC:  Intact.  HEENT/NECK:  No jugular venous distention or lymphadenopathy.  Oropharynx clear.  Neck supple.  NODES:  No peripheral lymphadenopathy.  SKIN:  Significant psoriatic changes in the hands, elbows, anterior tibiae, knees and feet; there are also psoriatic arthritic/rheumatoid arthritic-type changes in the hands and feet.  LABORATORY AND ACCESSORY DATA:  INR of 1.2.  White count 11.7, hemoglobin 11.4, platelet count at  182,000.  On 4 L, pH 7.45, PCO2 39, PO2 57.   Sodium 137, potassium 3.9, chloride 107, CO2 24, BUN 7, creatinine 0.8, blood sugar 122, calcium 7.9.  Liver function tests unremarkable.   Chest x-ray and CT scan of the chest are obtained and show increased interstitial markings and bilateral ground-glass appearance which was diffuse. There is some small hilar and perihilar lymphadenopathy noted.  IMPRESSION:  The patient has likely underlying pulmonary fibrosis related to the psoriatic arthritis as a collagen vascular disease manifestation.  Likely, with the perforation and localized peritonitis, had a bout of septicemia resulting in increased lung injury, resulting in increased alveolitis superimposed upon chronic fibrotic changes.  The patient likely also has chronic obstructive lung disease with obstructive airway disease on basis of chronic cigarette use.  The patient clearly is oxygen dependent at this time. It will be difficult to pulse steroids in this patient because of wound-healing issues.  The patient is also post laparotomy for perforated cecum with peritonitis and acute lung injury; also has had deep venous thrombosis in the past, doubt pulmonary embolism at this time.  RECOMMENDATIONS:  The patient needs to recover further from his laparotomy before any further pulmonary testing can significantly be done.  CT chest shows acute alveolitis, which is likely flared by recent peritonitis with SIRS syndrome and sepsis, plus-or-minus smoking, and ideally, the patient will need ultimately a bronchoscopy with biopsy if these changes persist.  He will also need a full set of pulmonary function studies including diffusion capacity and lung volumes once he recovers fully.  For now, Anderson am reluctant to pulse  steroids to a very high dosage and will increase only to 20 mg a day.  The patient will need Advair 250/50 one spray b.Anderson.d. for home discharge.  The patient will also need to have  home oxygen set up, 4 L -- rest, 6 L -- exertion.  Will follow up again with you on October 26, 2001 and also attempt to arrange outpatient followup. Dictated by:   Burnett Harry Joya Gaskins, M.D. [place] Attending Physician:  Myrtis Hopping DD:  10/25/01 TD:  10/28/01 Job: [id number]

## 2010-09-05 NOTE — Cardiovascular Report (Signed)
NAME:  [name], [name]                        ACCOUNT NO.:  [id number]   MEDICAL RECORD NO.:  [id number]                   PATIENT TYPE:  OIB   LOCATION:  2867                                 FACILITY:  [facility]   PHYSICIAN:  Minette Brine. Tysinger, M.D.              DATE OF BIRTH:  03/01/1951   DATE OF PROCEDURE:  01/16/2002  DATE OF DISCHARGE:  01/16/2002                              CARDIAC CATHETERIZATION   PROCEDURE:  1. Percutaneous sheath insertion into left brachial artery.  2. Catheter placement, abdominal aorta.  3. Pressure measurements.  4. Abdominal aortogram with bilateral lower extremity runoff.   INDICATIONS FOR PROCEDURE:  This 60 year old male has a history of severe  exertion-limiting claudication and an ABI study showed marked obstruction in  his iliofemoral area bilaterally.  He was then scheduled for angiography.   DESCRIPTION OF PROCEDURE:  After signing an informed consent, the patient  was premedicated with 50 mg of Benadryl intravenously and brought to the  peripheral vascular angiography lab on the 6th floor of [facility].  His right groin was prepped and draped in a sterile fashion and anesthetized  locally with 1% lidocaine.  A #18 gauge thin-walled needle was inserted in  the right femoral artery and a Seldinger wire was attempted to advance up  the femoral artery; however, it was obstructed in the right external iliac  artery.  A small injection was made by way of the needle showing a chronic-  appearing obstruction in the external iliac and right common femoral area.  We then prepped and draped in a sterile fashion the left groin area and  after anesthetizing the left groin, a needle was then inserted into the left  femoral artery and again flow was obtained but again we were unable to pass  the Seldinger wire through the iliac artery.  Again we documented total  occlusion.  We then prepped and draped in a sterile fashion the left  antecubital  fossa and after anesthetizing an area over the left brachial  artery, we inserted a #5 French introducer sheath percutaneously into the  left brachial artery.  The Seldinger wire was then easily passed through the  sheath and through the brachial artery and easily passed through the  subclavian artery into the aorta.  After advancing this wire into the  abdominal aorta, we then inserted a short pigtail catheter, #5 [place], over  the guidewire and positioned it within the abdominal aorta.  Pressures were  recorded and injections were made into the abdominal aorta with subtraction  cineangiography performed and the abdominal aorta, pelvis, and both lower  extremities.  We visualized the pelvic arteries showing total occlusion  bilaterally and the iliac arteries.  There was good reconstitution in both  femoral arteries and normal-appearing superficial femoral arteries.  The  patient tolerated the procedure well and no complications were noted.  At  the end of the procedure, the  catheter and sheath were removed from the  brachial artery and hemostasis was easily obtained with standard pressure  method.  He was then returned to the short-stay unit for further monitoring  prior to discharge later today.   CINE FINDINGS:  1. Abdominal aortogram:  The mid stream injection into the abdominal aorta     shows a normal-appearing suprarenal abdominal aorta.  The infrarenal     abdominal aorta is diffusely diseased with progressive narrowing of the     aorta into the bifurcation.  At the bifurcation, this atherosclerotic     disease extends into the bifurcation causing total occlusion of the left     common iliac artery at its origin and it also causes total occlusion of     the right external iliac artery in its proximal segment.  2. Pelvic arteries:  As previously mentioned, the left common iliac artery     is totally occluded at its origin.  It reconstitutes in the middle     segment where the left  internal iliac artery is visualized.  The external     iliac is visualized for a short segment and then totally occluded at the     end.  The distal left external iliac artery then reconstitutes just     before the left femoral artery.  The left femoral artery and superficial     femoral artery then appear normal with slow flow extending into the     popliteal artery.  Below the groin, the arteries appear normal.  In the     right pelvis, the right external iliac artery is totally occluded for a     short segment before reconstituting in the distal right external iliac.     The right femoral artery and superficial femoral artery then had slow     antegrade flow with normal appearance.  Below the knee also appears     normal.   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  2. Reconstitution of both femoral arteries with normal-appearing arteries     below the groin.   DISPOSITION:  The patient will be a good candidate for aortobifemoral  grafting.  Will ask surgery to see.  Will also be able to discharge today  after being monitored on the short-stay unit for several hours to observe  his left brachial artery puncture site.                                               John R. Tysinger, M.D.    JRT/MEDQ  D:  01/16/2002  T:  01/19/2002  Job:  [id number]   cc:   Lynnell Chad. Shelia Media, M.D.  [place]  [place] [address]  Fax: [phone]   Peripheral Vascular Lab, 6th Floor

## 2010-09-05 NOTE — Discharge Summary (Signed)
[place]. [facility]  Patient:    [name], [name]                     MRN: [id number] Adm. Date:  [id number] Disc. Date: [id number] Attending:  Nils Flack                           Discharge Summary  DISCHARGE DIAGNOSIS:  Pneumonia with hypoxia.  CONSULTATIONS:  None.  PROCEDURES:  None.  HISTORY OF PRESENT ILLNESS:  Please see dictated History and Physical.  The patient was seen in the office complaining of tiredness and weakness.  In the office, the patient was found to have bilateral infiltrates.  The patient was also found to be mildly hypoxic.  His O2 saturation was 90% on room air.  HOSPITAL COURSE:  The patient was found to have bilateral infiltrates on chest x-ray.  Symptomatically, the patient seemed to have symptoms consistent with pneumonia.  The patient was place on Rocephin initially and then started on Augmentin as he improved.  His exam showed bibasilar crackles initially, but these seemed to be improving.  By the time of discharge, the patient was significantly improved.  O2 saturations were in the low 90s on room air.  The patient appeared better.  The patient was discharged home to continue Augmentin for a total of one week.  CONDITION ON DISCHARGE:  Improved.  FOLLOWUP:  The patient will see Dr. Doreene Burke in one week.  DISCHARGE MEDICATIONS: 1. Augmentin 875 mg p.o. b.i.d. to complete seven days. 2. Prednisone 20 mg alternating with 15 mg of calcium. 3. Vioxx 50 mg q.d. 4. Zyrtec 10 mg q.d. 5. Multivitamin q.d. DD:  07/13/00 TD:  07/14/00 Job: [id number]

## 2010-09-05 NOTE — Op Note (Signed)
[place]. [facility]  Patient:    CAS, TRACZ Visit Number: [id number] MRN: [id number]          Service Type: MED Location: [phone] Attending Physician:  Gwenyth Ober Dictated by:   Judeth Horn, M.D. Proc. Date: 10/19/01 Admit Date:  10/18/2001                             Operative Report  INCOMPLETE  PREOPERATIVE DIAGNOSIS:  Perforated viscus, status post colonoscopy with biopsy.  POSTOPERATIVE DIAGNOSIS:  Cecotomy, status post colonoscopy, with local contamination and peritonitis.  PROCEDURE:  Repair of cecal perforation.  SURGEON:  Judeth Horn, M.D.  ASSISTANT:  Isabel Caprice. Hassell Done, M.D.  ANESTHESIA:  General endotracheal.  ESTIMATED BLOOD LOSS:  Less than 20 cc.  COMPLICATIONS:  None.  CONDITION STABLE.  INDICATION FOR OPERATION:  The patient is a 60 year old gentleman on steroids who had undergone a colonoscopy and biopsy yesterday, who ended up with free air on recent study today and peritonitis.  He was taken to the operating room for exploration and repair.  FINDINGS: Dictated by:   Judeth Horn, M.D. Attending Physician:  Gwenyth Ober DD:  10/19/01 TD:  10/22/01 Job: [id number]

## 2010-09-05 NOTE — Discharge Summary (Signed)
   NAME:  [name], [name]                        ACCOUNT NO.:  [id number]   MEDICAL RECORD NO.:  [id number]                   PATIENT TYPE:  INP   LOCATION:  5736                                 FACILITY:  [facility]   PHYSICIAN:  Kathryne Eriksson. Dahlia Bailiff, M.D.            DATE OF BIRTH:  1950/05/05   DATE OF ADMISSION:  10/18/2001  DATE OF DISCHARGE:  10/26/2001                                 DISCHARGE SUMMARY   DISCHARGE DIAGNOSES:  1. Colonic perforation, status post colonoscopy.  2. Severe chronic obstructive pulmonary disease requiring steroid     administration and home oxygen.  He was consulted by Burnett Harry. Joya Gaskins,     M.D. [facility]   PROCEDURE:  Exploratory laparotomy and repair of colonic perforation.  Dr.  Hulen Skains was the surgeon. Assistant was Dr. Carron Brazen.  Anesthesia was general  endotracheal.   DISCHARGE MEDICATIONS:  1. Coumadin 5 mg one p.o. q.d.  2. Ciprofloxacin.  3. Vicodin.  4. Prednisone 20 mg a day.  5. Advair.   There was a possibility that the patient may have had small pulmonary emboli  and therefore pulmonary was consulted.   HOSPITAL COURSE:  The patient is a 60 year old with abdominal pain after  colonoscopy.  He had free air on abdominal films and at surgery had a 2 cm  hole in the cecum where the patient had had a biopsy.  He was repaired with  staples and then subsequently on postoperative visit complicated by  pulmonary dysfunction requiring Coumadin therapy, prednisone, and pulmonary  medications.  He was discharged home on many of these medications.   FOLLOW UP:  He is to see Dr. Joya Gaskins, who is the consultant for the [facility] in August and to see Dr. Hulen Skains in a couple of weeks.   DISPOSITION:  He was discharged home in the care of his family.                                               Kathryne Eriksson. Dahlia Bailiff, M.D.    JOW/MEDQ  D:  12/22/2001  T:  12/24/2001  Job:  [id number]

## 2010-09-08 ENCOUNTER — Encounter (HOSPITAL_COMMUNITY): Payer: BC Managed Care – PPO

## 2010-09-08 ENCOUNTER — Encounter: Payer: Self-pay | Admitting: Emergency Medicine

## 2010-09-08 ENCOUNTER — Ambulatory Visit (HOSPITAL_COMMUNITY): Payer: BC Managed Care – PPO

## 2010-09-10 ENCOUNTER — Encounter (HOSPITAL_COMMUNITY): Payer: BC Managed Care – PPO

## 2010-09-10 ENCOUNTER — Ambulatory Visit: Payer: BC Managed Care – PPO | Admitting: Gastroenterology

## 2010-09-10 ENCOUNTER — Ambulatory Visit (HOSPITAL_COMMUNITY): Payer: BC Managed Care – PPO

## 2010-09-12 ENCOUNTER — Ambulatory Visit (HOSPITAL_COMMUNITY): Payer: BC Managed Care – PPO

## 2010-09-12 ENCOUNTER — Encounter (HOSPITAL_COMMUNITY): Payer: BC Managed Care – PPO

## 2010-09-15 ENCOUNTER — Encounter (HOSPITAL_COMMUNITY): Payer: BC Managed Care – PPO

## 2010-09-15 ENCOUNTER — Ambulatory Visit (HOSPITAL_COMMUNITY): Payer: BC Managed Care – PPO

## 2010-09-17 ENCOUNTER — Encounter (HOSPITAL_COMMUNITY): Payer: BC Managed Care – PPO

## 2010-09-17 ENCOUNTER — Ambulatory Visit (HOSPITAL_COMMUNITY): Payer: BC Managed Care – PPO

## 2010-09-19 ENCOUNTER — Encounter (HOSPITAL_COMMUNITY): Payer: BC Managed Care – PPO | Attending: Emergency Medicine

## 2010-09-19 ENCOUNTER — Encounter (HOSPITAL_COMMUNITY): Payer: BC Managed Care – PPO

## 2010-09-19 ENCOUNTER — Ambulatory Visit (HOSPITAL_COMMUNITY): Payer: BC Managed Care – PPO

## 2010-09-19 DIAGNOSIS — J4489 Other specified chronic obstructive pulmonary disease: Secondary | ICD-10-CM | POA: Insufficient documentation

## 2010-09-19 DIAGNOSIS — J449 Chronic obstructive pulmonary disease, unspecified: Secondary | ICD-10-CM | POA: Insufficient documentation

## 2010-09-19 HISTORY — PX: OTHER SURGICAL HISTORY: SHX169

## 2010-09-19 HISTORY — PX: DOPPLER ECHOCARDIOGRAPHY: SHX263

## 2010-09-22 ENCOUNTER — Encounter (HOSPITAL_COMMUNITY): Payer: BC Managed Care – PPO

## 2010-09-22 ENCOUNTER — Other Ambulatory Visit: Payer: Self-pay

## 2010-09-22 ENCOUNTER — Ambulatory Visit (HOSPITAL_COMMUNITY): Payer: BC Managed Care – PPO

## 2010-09-23 ENCOUNTER — Encounter: Payer: Self-pay | Admitting: Emergency Medicine

## 2010-09-23 ENCOUNTER — Other Ambulatory Visit (INDEPENDENT_AMBULATORY_CARE_PROVIDER_SITE_OTHER): Payer: BC Managed Care – PPO

## 2010-09-23 ENCOUNTER — Ambulatory Visit (INDEPENDENT_AMBULATORY_CARE_PROVIDER_SITE_OTHER): Payer: BC Managed Care – PPO | Admitting: Emergency Medicine

## 2010-09-23 DIAGNOSIS — I2789 Other specified pulmonary heart diseases: Secondary | ICD-10-CM

## 2010-09-23 DIAGNOSIS — J449 Chronic obstructive pulmonary disease, unspecified: Secondary | ICD-10-CM

## 2010-09-23 DIAGNOSIS — L408 Other psoriasis: Secondary | ICD-10-CM

## 2010-09-23 DIAGNOSIS — J8410 Pulmonary fibrosis, unspecified: Secondary | ICD-10-CM

## 2010-09-23 LAB — HEPATIC FUNCTION PANEL
ALT: 30 U/L (ref 0–53)
AST: 29 U/L (ref 0–37)
Alkaline Phosphatase: 39 U/L (ref 39–117)
Bilirubin, Direct: 0.1 mg/dL (ref 0.0–0.3)
Total Protein: 6.7 g/dL (ref 6.0–8.3)

## 2010-09-23 NOTE — Assessment & Plan Note (Signed)
Tolerating Tyvaso and letaris Continue same and check LFT monthly.  Continue to follow at [facility] transplant center

## 2010-09-23 NOTE — Assessment & Plan Note (Signed)
No indication to repeat imaging at this time. Will follow clinically.

## 2010-09-23 NOTE — Assessment & Plan Note (Signed)
Continue same prednisone.

## 2010-09-23 NOTE — Progress Notes (Signed)
  Subjective:    Patient ID: [name], male    DOB: [birth year], 60 y.o.   MRN: [id number]  HPI 60 yo man, former smoker with COPD, hx psoriatic arthritis and associated ILD, profound hypoxemia and associated Pulm HTN (PAP 57/26 mean 37, PAOP 14/11 mean 7 by cath 10/04/09. 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 Admitted 62/21-27 with resp failure and decompensated R heart failure. He was diuresed, treated with corticosteroids and started on bronchodilators for his COPD. O2 was titrated to 5L/min at rest, he is leaving it on 5 with exertion.   ROV 02/05/10 -- 60yo, PAH with Hx psoriatic arthritis, ILD and R heart failure, hypoxemia, on chronic pred 58m once daily. Also hx COPD.  On letaris since September '11. Since last time breathing may be a bit better. He has good days and bad days. Rarely uses ventolin. No HA, no syncope or near syncope. LE edema has been well controlled and stable. Was desaturated while walking today on presentation. Has been gaining some wt, aldactone was decreased by Dr SElisabeth Caraa month ago. PFTs done today.   ROV 04/16/10 -- Hx as above, follows up after TTE and 6 min walk. Returns feeling a bit more SOB w exertion. 6 min walk distance = 1646[place]oc with significant desat to 75% (after 2 min). Estimated RVSP 7939m(up from 59 in 09/2009). Has been on letaris since September. Edema seems better on lasix 68m81mo times a day. Clearly both TTE and walk distance are worse.   ROV 05/19/10 -- returns for f/u of the above issues. last time we started Tyvaso in addition to his letaris. Has been on it for a month. Up to 9 puffs 4x a day. Has been assoc with some HA, occas cough right after doing it, not intrusive. On Pred 10mg5miriva + ventoilin as needed. He is not noticing any big changes in his breathing yet on the Tyvaso. Monthly LFT's normal 1/17.   ROV 07/29/10 -- COPD, psoriatic arthritis + ILD, PAH. Regimen is Tyvaso + Letaris. [facility] They have told him he needs screening CSY, EtOH and tobacco random screening, counseling. Tells me today that his breathing is stable, although he has had trouble w allergies and w the pollen. Taking spiriva + SABA. O2 is set on 6L/min via oximizer.   ROV 09/23/10 -- COPD, psoriatic arthritis + ILD, PAH. He is feeling well, has been doing pulm rehab. Has been maintained on Tyvaso, Letaris. [facility]   Review of Systems As per HPI    Objective:   Physical Exam Gen: Pleasant, hyperpigmented face, no distress  ENT: No lesions,  mouth clear,  oropharynx clear, no postnasal drip, throat clearing  Neck: No JVD, no TMG, no carotid bruits  Lungs: No use of accessory muscles, no dullness to percussion, clear without rales or rhonchi  Cardiovascular: RRR, heart sounds normal, no murmur or gallops, no peripheral edema  Musculoskeletal: No deformities, no cyanosis or clubbing  Neuro: alert, non focal  Skin: Warm, no lesions or rashes      Assessment & Plan:  PULMONARY HYPERTENSION, SECONDARY Tolerating Tyvaso and letaris Continue same and check LFT monthly.  [facility]  C O P D Continue Spiriva  PSORIASIS Continue same prednisone.   PULMONARY FIBROSIS No indication to repeat imaging at this time. Will follow clinically.

## 2010-09-23 NOTE — Progress Notes (Signed)
Addended by: Francesca Jewett A on: 09/23/2010 02:53 PM   Modules accepted: Orders

## 2010-09-23 NOTE — Assessment & Plan Note (Signed)
Continue Spiriva 

## 2010-09-24 ENCOUNTER — Encounter (HOSPITAL_COMMUNITY): Payer: BC Managed Care – PPO

## 2010-09-26 ENCOUNTER — Encounter (HOSPITAL_COMMUNITY): Payer: BC Managed Care – PPO

## 2010-09-29 ENCOUNTER — Inpatient Hospital Stay (HOSPITAL_COMMUNITY): Payer: BC Managed Care – PPO

## 2010-09-29 ENCOUNTER — Encounter (HOSPITAL_COMMUNITY): Payer: BC Managed Care – PPO

## 2010-09-29 ENCOUNTER — Inpatient Hospital Stay (HOSPITAL_COMMUNITY)
Admission: EM | Admit: 2010-09-29 | Discharge: 2010-10-03 | DRG: 087 | Disposition: A | Discharge status: Home Health | Payer: BC Managed Care – PPO | Source: Other Acute Inpatient Hospital | Attending: Emergency Medicine | Admitting: Emergency Medicine

## 2010-09-29 ENCOUNTER — Emergency Department (HOSPITAL_COMMUNITY)
Admission: EM | Admit: 2010-09-29 | Discharge: 2010-09-29 | Disposition: A | Discharge status: Other Acute Inpatient Hospital | Payer: BC Managed Care – PPO | Source: Home / Self Care | Attending: Emergency Medicine | Admitting: Emergency Medicine

## 2010-09-29 ENCOUNTER — Emergency Department (HOSPITAL_COMMUNITY): Payer: BC Managed Care – PPO

## 2010-09-29 DIAGNOSIS — M359 Systemic involvement of connective tissue, unspecified: Secondary | ICD-10-CM | POA: Diagnosis present

## 2010-09-29 DIAGNOSIS — R0609 Other forms of dyspnea: Secondary | ICD-10-CM | POA: Insufficient documentation

## 2010-09-29 DIAGNOSIS — E785 Hyperlipidemia, unspecified: Secondary | ICD-10-CM | POA: Insufficient documentation

## 2010-09-29 DIAGNOSIS — J84111 Idiopathic interstitial pneumonia, not otherwise specified: Secondary | ICD-10-CM

## 2010-09-29 DIAGNOSIS — L408 Other psoriasis: Secondary | ICD-10-CM | POA: Insufficient documentation

## 2010-09-29 DIAGNOSIS — R0989 Other specified symptoms and signs involving the circulatory and respiratory systems: Secondary | ICD-10-CM | POA: Insufficient documentation

## 2010-09-29 DIAGNOSIS — Z86718 Personal history of other venous thrombosis and embolism: Secondary | ICD-10-CM

## 2010-09-29 DIAGNOSIS — Z79899 Other long term (current) drug therapy: Secondary | ICD-10-CM

## 2010-09-29 DIAGNOSIS — M069 Rheumatoid arthritis, unspecified: Secondary | ICD-10-CM | POA: Insufficient documentation

## 2010-09-29 DIAGNOSIS — J8410 Pulmonary fibrosis, unspecified: Secondary | ICD-10-CM | POA: Insufficient documentation

## 2010-09-29 DIAGNOSIS — L4050 Arthropathic psoriasis, unspecified: Secondary | ICD-10-CM | POA: Diagnosis present

## 2010-09-29 DIAGNOSIS — R0902 Hypoxemia: Secondary | ICD-10-CM

## 2010-09-29 DIAGNOSIS — J4489 Other specified chronic obstructive pulmonary disease: Secondary | ICD-10-CM | POA: Diagnosis present

## 2010-09-29 DIAGNOSIS — Z7901 Long term (current) use of anticoagulants: Secondary | ICD-10-CM | POA: Insufficient documentation

## 2010-09-29 DIAGNOSIS — J449 Chronic obstructive pulmonary disease, unspecified: Secondary | ICD-10-CM | POA: Diagnosis present

## 2010-09-29 DIAGNOSIS — I749 Embolism and thrombosis of unspecified artery: Secondary | ICD-10-CM

## 2010-09-29 DIAGNOSIS — D72829 Elevated white blood cell count, unspecified: Secondary | ICD-10-CM | POA: Diagnosis present

## 2010-09-29 DIAGNOSIS — J9620 Acute and chronic respiratory failure, unspecified whether with hypoxia or hypercapnia: Principal | ICD-10-CM | POA: Diagnosis present

## 2010-09-29 DIAGNOSIS — I2789 Other specified pulmonary heart diseases: Secondary | ICD-10-CM | POA: Diagnosis present

## 2010-09-29 DIAGNOSIS — E119 Type 2 diabetes mellitus without complications: Secondary | ICD-10-CM | POA: Diagnosis present

## 2010-09-29 DIAGNOSIS — IMO0002 Reserved for concepts with insufficient information to code with codable children: Secondary | ICD-10-CM | POA: Insufficient documentation

## 2010-09-29 DIAGNOSIS — Z9981 Dependence on supplemental oxygen: Secondary | ICD-10-CM

## 2010-09-29 DIAGNOSIS — I509 Heart failure, unspecified: Secondary | ICD-10-CM | POA: Diagnosis present

## 2010-09-29 DIAGNOSIS — I739 Peripheral vascular disease, unspecified: Secondary | ICD-10-CM | POA: Insufficient documentation

## 2010-09-29 DIAGNOSIS — J9600 Acute respiratory failure, unspecified whether with hypoxia or hypercapnia: Secondary | ICD-10-CM

## 2010-09-29 DIAGNOSIS — Z87891 Personal history of nicotine dependence: Secondary | ICD-10-CM

## 2010-09-29 LAB — DIFFERENTIAL
Basophils Relative: 1 % (ref 0–1)
Eosinophils Absolute: 0.7 10*3/uL (ref 0.0–0.7)
Lymphs Abs: 1 10*3/uL (ref 0.7–4.0)
Neutro Abs: 7.6 10*3/uL (ref 1.7–7.7)
Neutrophils Relative %: 74 % (ref 43–77)

## 2010-09-29 LAB — COMPREHENSIVE METABOLIC PANEL
CO2: 21 mEq/L (ref 19–32)
Calcium: 9.6 mg/dL (ref 8.4–10.5)
Creatinine, Ser: 0.99 mg/dL (ref 0.4–1.5)
GFR calc Af Amer: >60 mL/min (ref >60)
GFR calc non Af Amer: >60 mL/min (ref >60)
Glucose, Bld: 142 mg/dL — ABNORMAL HIGH (ref 70–99)

## 2010-09-29 LAB — POCT I-STAT 3, ART BLOOD GAS (G3+)
Acid-Base Excess: 4 mmol/L — ABNORMAL HIGH (ref 0.0–2.0)
O2 Saturation: 85 %

## 2010-09-29 LAB — CARDIAC PANEL(CRET KIN+CKTOT+MB+TROPI)
Relative Index: INVALID (ref 0.0–2.5)
Troponin I: <0.3 ng/mL (ref <0.30)

## 2010-09-29 LAB — PROTIME-INR: Prothrombin Time: 19.5 seconds — ABNORMAL HIGH (ref 11.6–15.2)

## 2010-09-29 LAB — CK TOTAL AND CKMB (NOT AT ARMC): Total CK: 58 U/L (ref 7–232)

## 2010-09-29 LAB — CBC
MCV: 100 fL (ref 78.0–100.0)
Platelets: 146 10*3/uL — ABNORMAL LOW (ref 150–400)
RBC: 4.39 MIL/uL (ref 4.22–5.81)
WBC: 10.2 10*3/uL (ref 4.0–10.5)

## 2010-09-29 LAB — LACTIC ACID, PLASMA
Lactic Acid, Venous: 1.3 mmol/L (ref 0.5–2.2)
Lactic Acid, Venous: 1 mmol/L (ref 0.5–2.2)

## 2010-09-29 LAB — GLUCOSE, CAPILLARY: Glucose-Capillary: 140 mg/dL — ABNORMAL HIGH (ref 70–99)

## 2010-09-29 LAB — PROCALCITONIN: Procalcitonin: <0.05 ng/mL

## 2010-09-29 LAB — APTT: aPTT: 37 seconds (ref 24–37)

## 2010-09-29 MED ORDER — IOHEXOL 300 MG/ML  SOLN
100.0000 mL | Freq: Once | INTRAMUSCULAR | Status: AC | PRN
  Administered 2010-09-29: 100 mL via INTRAVENOUS

## 2010-09-30 ENCOUNTER — Inpatient Hospital Stay (HOSPITAL_COMMUNITY): Payer: BC Managed Care – PPO

## 2010-09-30 DIAGNOSIS — R0602 Shortness of breath: Secondary | ICD-10-CM

## 2010-09-30 LAB — BASIC METABOLIC PANEL
BUN: 14 mg/dL (ref 6–23)
CO2: 27 mEq/L (ref 19–32)
GFR calc non Af Amer: >60 mL/min (ref >60)
Glucose, Bld: 176 mg/dL — ABNORMAL HIGH (ref 70–99)
Potassium: 3.7 mEq/L (ref 3.5–5.1)

## 2010-09-30 LAB — GLUCOSE, CAPILLARY
Glucose-Capillary: 166 mg/dL — ABNORMAL HIGH (ref 70–99)
Glucose-Capillary: 279 mg/dL — ABNORMAL HIGH (ref 70–99)

## 2010-09-30 LAB — CBC
HCT: 41.2 % (ref 39.0–52.0)
Hemoglobin: 13.7 g/dL (ref 13.0–17.0)
MCHC: 33.3 g/dL (ref 30.0–36.0)

## 2010-09-30 LAB — HEPARIN LEVEL (UNFRACTIONATED): Heparin Unfractionated: 0.36 IU/mL (ref 0.30–0.70)

## 2010-10-01 ENCOUNTER — Encounter (HOSPITAL_COMMUNITY): Payer: BC Managed Care – PPO

## 2010-10-01 ENCOUNTER — Inpatient Hospital Stay (HOSPITAL_COMMUNITY): Payer: BC Managed Care – PPO

## 2010-10-01 DIAGNOSIS — I2609 Other pulmonary embolism with acute cor pulmonale: Secondary | ICD-10-CM

## 2010-10-01 LAB — PROTIME-INR: INR: 2.17 — ABNORMAL HIGH (ref 0.00–1.49)

## 2010-10-01 LAB — BASIC METABOLIC PANEL
CO2: 25 mEq/L (ref 19–32)
GFR calc non Af Amer: >60 mL/min (ref >60)
Glucose, Bld: 149 mg/dL — ABNORMAL HIGH (ref 70–99)
Potassium: 3.8 mEq/L (ref 3.5–5.1)
Sodium: 142 mEq/L (ref 135–145)

## 2010-10-01 LAB — GLUCOSE, CAPILLARY
Glucose-Capillary: 175 mg/dL — ABNORMAL HIGH (ref 70–99)
Glucose-Capillary: 258 mg/dL — ABNORMAL HIGH (ref 70–99)
Glucose-Capillary: 161 mg/dL — ABNORMAL HIGH (ref 70–99)

## 2010-10-01 LAB — CBC
Hemoglobin: 13.2 g/dL (ref 13.0–17.0)
MCH: 32.8 pg (ref 26.0–34.0)
MCV: 99.5 fL (ref 78.0–100.0)
RBC: 4.02 MIL/uL — ABNORMAL LOW (ref 4.22–5.81)
WBC: 17.3 10*3/uL — ABNORMAL HIGH (ref 4.0–10.5)

## 2010-10-01 LAB — MAGNESIUM: Magnesium: 2.6 mg/dL — ABNORMAL HIGH (ref 1.5–2.5)

## 2010-10-01 LAB — PHOSPHORUS: Phosphorus: 4.2 mg/dL (ref 2.3–4.6)

## 2010-10-02 LAB — PROTIME-INR
INR: 2.99 — ABNORMAL HIGH (ref 0.00–1.49)
Prothrombin Time: 31.1 seconds — ABNORMAL HIGH (ref 11.6–15.2)

## 2010-10-02 LAB — CBC
HCT: 38.2 % — ABNORMAL LOW (ref 39.0–52.0)
MCH: 32.3 pg (ref 26.0–34.0)
MCV: 98.7 fL (ref 78.0–100.0)
Platelets: 141 10*3/uL — ABNORMAL LOW (ref 150–400)
RBC: 3.87 MIL/uL — ABNORMAL LOW (ref 4.22–5.81)
WBC: 13.7 10*3/uL — ABNORMAL HIGH (ref 4.0–10.5)

## 2010-10-02 LAB — BASIC METABOLIC PANEL
BUN: 22 mg/dL (ref 6–23)
CO2: 27 mEq/L (ref 19–32)
Calcium: 8.1 mg/dL — ABNORMAL LOW (ref 8.4–10.5)
Chloride: 101 mEq/L (ref 96–112)
Creatinine, Ser: 0.94 mg/dL (ref 0.4–1.5)

## 2010-10-03 ENCOUNTER — Encounter (HOSPITAL_COMMUNITY): Payer: BC Managed Care – PPO

## 2010-10-03 LAB — BASIC METABOLIC PANEL
Chloride: 102 mEq/L (ref 96–112)
GFR calc Af Amer: >60 mL/min (ref >60)
Potassium: 3.4 mEq/L — ABNORMAL LOW (ref 3.5–5.1)

## 2010-10-03 LAB — GLUCOSE, CAPILLARY: Glucose-Capillary: 162 mg/dL — ABNORMAL HIGH (ref 70–99)

## 2010-10-03 LAB — PROTIME-INR
INR: 2.35 — ABNORMAL HIGH (ref 0.00–1.49)
Prothrombin Time: 26.1 seconds — ABNORMAL HIGH (ref 11.6–15.2)

## 2010-10-03 LAB — PRO B NATRIURETIC PEPTIDE: Pro B Natriuretic peptide (BNP): 548.3 pg/mL — ABNORMAL HIGH (ref 0–125)

## 2010-10-05 LAB — CULTURE, BLOOD (ROUTINE X 2): Culture: NO GROWTH

## 2010-10-06 ENCOUNTER — Encounter (HOSPITAL_COMMUNITY): Payer: BC Managed Care – PPO

## 2010-10-08 ENCOUNTER — Encounter (HOSPITAL_COMMUNITY): Payer: BC Managed Care – PPO

## 2010-10-10 ENCOUNTER — Other Ambulatory Visit (INDEPENDENT_AMBULATORY_CARE_PROVIDER_SITE_OTHER): Payer: BC Managed Care – PPO

## 2010-10-10 ENCOUNTER — Encounter (HOSPITAL_COMMUNITY): Payer: BC Managed Care – PPO

## 2010-10-10 ENCOUNTER — Ambulatory Visit (INDEPENDENT_AMBULATORY_CARE_PROVIDER_SITE_OTHER): Payer: BC Managed Care – PPO | Admitting: Adult Health

## 2010-10-10 ENCOUNTER — Encounter: Payer: Self-pay | Admitting: Adult Health

## 2010-10-10 VITALS — BP 114/58 | HR 72 | Temp 98.3°F | Ht 67.0 in | Wt 181.8 lb

## 2010-10-10 DIAGNOSIS — I2789 Other specified pulmonary heart diseases: Secondary | ICD-10-CM

## 2010-10-10 DIAGNOSIS — J9610 Chronic respiratory failure, unspecified whether with hypoxia or hypercapnia: Secondary | ICD-10-CM

## 2010-10-10 LAB — BASIC METABOLIC PANEL
BUN: 19 mg/dL (ref 6–23)
Chloride: 102 mEq/L (ref 96–112)
Creatinine, Ser: 0.9 mg/dL (ref 0.4–1.5)
GFR: 92.02 mL/min (ref >60.00)

## 2010-10-10 NOTE — Assessment & Plan Note (Signed)
Acute on chronic resp failure with underlying PF/RA/PAH  Recent flare with hospitlization with suspected volume overload  Slowly improving  Advised on O2 compliance with oximizer  Will restart spironolactone  Taper prednisone to 53m and hold  follow up dr byrum in 3 weeks and As needed   Please contact office for sooner follow up if symptoms do not improve or worsen or seek emergency care

## 2010-10-10 NOTE — Patient Instructions (Signed)
Restart Spironolactone 83m 1/2 Twice daily   Continue on current reigmen . Very important to wear O2 and use Oximizer device w/ O2 at all times  follow up Dr. BLamonte Sakaiin 3-4 weeks and As needed

## 2010-10-10 NOTE — Progress Notes (Signed)
Subjective:    Patient ID: [name], male    DOB: [birth year], 60 y.o.   MRN: [id number]  HPI 60 yo man, former smoker with COPD, hx psoriatic arthritis and associated ILD, profound hypoxemia and associated Pulm HTN (PAP 57/26 mean 37, PAOP 14/11 mean 7 by cath 10/04/09. 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 Admitted 6/21-27 with resp failure and decompensated R heart failure. He was diuresed, treated with corticosteroids and started on bronchodilators for his COPD. O2 was titrated to 5L/min at rest, he is leaving it on 5 with exertion.   ROV 02/05/10 -- 60yo, PAH with Hx psoriatic arthritis, ILD and R heart failure, hypoxemia, on chronic pred 2m once daily. Also hx COPD.  On letaris since September '11. Since last time breathing may be a bit better. He has good days and bad days. Rarely uses ventolin. No HA, no syncope or near syncope. LE edema has been well controlled and stable. Was desaturated while walking today on presentation. Has been gaining some wt, aldactone was decreased by Dr SElisabeth Caraa month ago. PFTs done today.   ROV 04/16/10 -- Hx as above, follows up after TTE and 6 min walk. Returns feeling a bit more SOB w exertion. 6 min walk distance = 1668massoc with significant desat to 75% (after 2 min). Estimated RVSP 7948m(up from 59 in 09/2009). Has been on letaris since September. Edema seems better on lasix 62m25mo times a day. Clearly both TTE and walk distance are worse.   ROV 05/19/10 -- returns for f/u of the above issues. last time we started Tyvaso in addition to his letaris. Has been on it for a month. Up to 9 puffs 4x a day. Has been assoc with some HA, occas cough right after doing it, not intrusive. On Pred 10mg8miriva + ventoilin as needed. He is not noticing any big changes in his breathing yet on the Tyvaso. Monthly LFT's normal 1/17.   ROV 07/29/10 -- COPD, psoriatic arthritis + ILD, PAH. Regimen is Tyvaso + Letaris. [facility] They have told him he needs screening CSY, EtOH and tobacco random screening, counseling. Tells me today that his breathing is stable, although he has had trouble w allergies and w the pollen. Taking spiriva + SABA. O2 is set on 6L/min via oximizer.   ROV 09/23/10 -- COPD, psoriatic arthritis + ILD, PAH. He is feeling well, has been doing pulm rehab. Has been maintained on Tyvaso, Letaris. [facility]. >> cont on current regimen  POST HOSP 10/10/10  Pt returns for post hospital follow up  Pt was admitted   6/11-6/15 for acute on chronic resp failure w/ underlying PF/RA/PAH.  CT chest was neg for PE, chronic changes along with diffuse ASPDZ -?alveolitis vs pulm edema. BNP >2000 . At discharge BNP 548.  He did receive increased steroids and diuresis along pulmonary hygiene.   Since discharge he is better but still remains weak and wears out easily.  Ankles are still puffy esp in the evening. He says he is off his spironolactone but unclear why-no mention in discharge summary why this was held   Today BMET shows K+ 3.7         Review of Systems Constitutional:   No  weight loss, night sweats,  Fevers, chills,  +fatigue, or  lassitude.  HEENT:   No headaches,  Difficulty swallowing,  Tooth/dental problems, or  Sore throat,                No sneezing, itching, ear ache, nasal congestion, post nasal drip,   CV:  No chest pain,  Orthopnea, PND,   anasarca, dizziness, palpitations, syncope.   GI  No heartburn, indigestion, abdominal pain, nausea, vomiting, diarrhea, change in bowel habits, loss of appetite, bloody stools.   Resp:  No excess mucus, no productive cough,    No coughing up of blood.  No change in color of mucus.  No wheezing.  No chest wall deformity  Skin: no rash or lesions.  GU: no dysuria, change in color of urine, no urgency or frequency.  No flank pain, no hematuria   MS:  No joint pain or swelling.  No decreased range of motion.  No  back pain.  Psych:  No change in mood or affect. No depression or anxiety.  No memory loss.         Objective:   Physical Exam Gen: Pleasant, hyperpigmented face, no distress  ENT: No lesions,  mouth clear,  oropharynx clear, no postnasal drip, throat clearing  Neck: No JVD, no TMG, no carotid bruits  Lungs: No use of accessory muscles, no dullness to percussion, clear without rales or rhonchi  Cardiovascular: RRR, heart sounds normal, no murmur or gallops, no peripheral edema  Musculoskeletal: No deformities, no cyanosis or clubbing  Neuro: alert, non focal  Skin: Warm, no lesions or rashes, hyperpigmented face psoriatic patches along arms and legs       Assessment & Plan:

## 2010-10-11 NOTE — Discharge Summary (Signed)
NAMESECUNDINO, ELLITHORPE NO.:  [id number]  MEDICAL RECORD NO.:  [id number]  LOCATION:  5523                         FACILITY:  [facility]  PHYSICIAN:  Burnett Harry. Joya Gaskins, MD, FCCPDATE OF BIRTH:  [birth year]  DATE OF ADMISSION:  09/29/2010 DATE OF DISCHARGE:  10/03/2010                              DISCHARGE SUMMARY   DISCHARGE DIAGNOSES: 1. Chronic hypoxic respiratory failure. 2. Pulmonary fibrosis/pulmonary artery hypertension/rheumatoid     arthritis. 3. Chronic obstructive pulmonary disease. 4. Congestive heart failure/cor pulmonale.  HISTORY OF PRESENT ILLNESS:  Mr. Caiazzo is a 60 year old male with known history of pulmonary fibrosis, interstitial lung disease, pulmonary hypertension related to collagen vascular disease and history of DVT who presented on June 11 to [facility] with shortness of breath.  He was on his way to pulmonary rehab and became acutely short of breath while walking.  According to the patient, O2 sat were 45-65% on his maintenance home oxygen.  He was transferred to [facility] admitted the patient at that time.  LABORATORY DATA:  At the time of admission June 11; CBC, white blood cell 10.2, hemoglobin 14.5, hematocrit 43.9 and platelets 146.  INR 1.63.  Troponin less than 0.30.  Complete metabolic panel; sodium 295, potassium 3.5, glucose 142, BUN 14, creatinine 0.99.  BNP was 2019. Lactic acid 1.3.  Procalcitonin less than 0.05.  Most recent laboratory data June 15; basic metabolic panel, sodium 621, potassium 3.4, glucose 78, BUN 14, creatinine 0.86, INR 2.35.  BNP 548.  MICRO DATA:  Blood cultures x2 June 11, final report is no growth.  RADIOLOGY DATA:  CT angio of the chest on June 11 shows no evidence of pulmonary embolus, nonspecific mild mediastinal lymphadenopathy, diffuse primarily peripheral and basilar interstitial fibrosis with honeycombing and traction bronchiectasis consistent with UIP,  diffuse airspace disease throughout the lungs could represent active alveolitis or pulmonary edema.  Most recent chest x-ray June 13 shows increased bibasilar airspace disease and stable diffuse interstitial pattern compatible with fibrosis.  HOSPITAL COURSE BY DISCHARGE DIAGNOSES: 1. Chronic hypoxic respiratory failure.  Unclear what caused the     patient's acute worsening hypoxia, acute PE/DVT was ruled out and     was a concern secondary to the patient being subtherapeutic on     admission.  There certainly was a possibility of pneumonia.     However, procalcitonin was repeatedly low.  The patient has been     steroid dependent due to psoriatic arthritis and pulmonary     fibrosis, likely contributing factor to acute shortness of breath     as worsening cor pulmonale with BNP greater than 2000 on admission.     The patient was treated with Lasix, IV steroids and high-flow O2.     At the time of discharge, his respiratory status was back to his     baseline which is poor.  He requires 5-6 L of O2 at rest and will     be increased to 6-8 L of O2 with any exertion.  The patient should     continue pulmonary rehab.  Please see further problems below for     other problems.  2. 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  The patient, according to the     notes, has recently declined a lung transplant.  Again, the patient     was treated with increased steroids which will be tapered back to     his baseline of 10 mg daily.  He remained on his Tyvaso and     Letairis, oxygen and will follow up with Dr. Lamonte Sakai in the outpatient setting. 3. Chronic obstructive pulmonary disease.  The patient will be     discharged on his maintenance Spiriva and will be transitioned to     nebulized bronchodilators at home. 4. Congestive heart failure/cor pulmonale.  Again, this is     multifactorial in the setting of pulmonary hypertension, pulmonary      fibrosis, and CHF.  The patient will continue on his Lasix 40 mg     p.o. b.i.d.  Continuous oxygen, need for strict compliance with     medications and oxygen was stressed and the patient will follow up     with his primary care physician as well.  Please note there is concern at home for the patient's compliance with oxygen as well as medications.  He lives at home with his wife but she works and he is home alone for a large portion of the time.  Referral has been made to home health for home health RN to assess oxygenation at home and medication compliance.  DISCHARGE MEDICATIONS: 1. Albuterol 2.5 mg nebulizer inhale q.6 h. 2. Prednisone 20 mg p.o. daily x7 days, then 15 mg daily times 7 days,     then 10 mg daily as per the patient's previous maintenance dose. 3. Coumadin 2 mg p.o. daily until the patient is seen by Dr. Shelia Media on     June 18 at 3:30 p.m. and then as directed. 4. Letairis 5 mg p.o. daily. 5. Cilostazol 100 mg p.o. b.i.d. 6. Folic acid 1 mg p.o. daily. 7. Lasix 40 mg p.o. b.i.d. 8. Humira 40 mg injection subcutaneously every 2 weeks. 9. Mucinex 600 mg 1-2 tablets p.o. daily as needed for cough andcongestion. 10.Multivitamin 1 tablet p.o. daily. 11.Potassium 20 mEq p.o. b.i.d. 12.Protonix 40 mg p.o. daily. 13.Zocor 20 mg p.o. nightly. 14.Tyvaso 9 puffs inhale q.i.d. 15.Tylenol 650 mg p.o. b.i.d. as needed for pain or fever. 16.Thiamine 100 mg p.o. daily. 17.Spiriva 1 capsule 18 mcg capsule inhale daily. 18.Albuterol inhaler 2 puffs inhale q.4 h. p.r.n. for shortness of     breath or wheezing. 19.Vitamin D3 1 tablet p.o. nightly. 20.O2 at 5-6 L at rest, 6-8 L with exertion.  DISCHARGE ACTIVITY:  The patient is to increase activity slowly.  He is to wear his oxygen all times and increase 6-8 liters with activity and he is to return to pulmonary rehab.  DIET:  Low-sodium heart-healthy diet.  FOLLOWUP APPOINTMENTS: 1. Rexene Edison, NP on June 22 at 9:45. 2.  Dr. Lamonte Sakai on July 6 at 10:15. 3. Dr. Shelia Media on July 2 at 11:00 a.m. 4. [facility] on June 18 at 3:30  DISPOSITION:  The patient has met maximum benefit from his inpatient hospitalization.  He is medically cleared and ready for discharge pending outpatient followup.  The patient is discharged to home with continuous oxygen and Home Health RN.  Greater than 30 minutes was spent on this discharge.     Nickolas Madrid, NP   ______________________________ Burnett Harry Joya Gaskins, MD, FCCP  KW/MEDQ  D:  10/03/2010  T:  10/03/2010  Job:  [id number]  cc:   Lynnell Chad. Shelia Media, M.D. Collene Gobble, MD  Electronically Signed by Darlina Sicilian N.P. on 10/08/2010 07:50:08 AM Electronically Signed by Asencion Noble MD FCCP on 10/11/2010 02:12:32 PM

## 2010-10-13 ENCOUNTER — Encounter (HOSPITAL_COMMUNITY): Payer: BC Managed Care – PPO

## 2010-10-15 ENCOUNTER — Encounter (HOSPITAL_COMMUNITY): Payer: BC Managed Care – PPO

## 2010-10-17 ENCOUNTER — Encounter (HOSPITAL_COMMUNITY): Payer: BC Managed Care – PPO

## 2010-10-20 ENCOUNTER — Other Ambulatory Visit: Payer: Self-pay | Admitting: Emergency Medicine

## 2010-10-20 ENCOUNTER — Encounter (HOSPITAL_COMMUNITY): Payer: BC Managed Care – PPO

## 2010-10-20 ENCOUNTER — Other Ambulatory Visit: Payer: Self-pay

## 2010-10-22 ENCOUNTER — Encounter (HOSPITAL_COMMUNITY): Payer: BC Managed Care – PPO

## 2010-10-24 ENCOUNTER — Ambulatory Visit (INDEPENDENT_AMBULATORY_CARE_PROVIDER_SITE_OTHER)
Admission: RE | Admit: 2010-10-24 | Discharge: 2010-10-24 | Disposition: A | Discharge status: Home / Self Care | Payer: BC Managed Care – PPO | Source: Ambulatory Visit | Attending: Emergency Medicine | Admitting: Emergency Medicine

## 2010-10-24 ENCOUNTER — Encounter: Payer: Self-pay | Admitting: Emergency Medicine

## 2010-10-24 ENCOUNTER — Other Ambulatory Visit (INDEPENDENT_AMBULATORY_CARE_PROVIDER_SITE_OTHER): Payer: BC Managed Care – PPO

## 2010-10-24 ENCOUNTER — Ambulatory Visit (INDEPENDENT_AMBULATORY_CARE_PROVIDER_SITE_OTHER): Payer: BC Managed Care – PPO | Admitting: Emergency Medicine

## 2010-10-24 ENCOUNTER — Other Ambulatory Visit: Payer: Self-pay | Admitting: *Deleted

## 2010-10-24 ENCOUNTER — Encounter (HOSPITAL_COMMUNITY): Payer: BC Managed Care – PPO

## 2010-10-24 VITALS — BP 116/62 | HR 79 | Temp 98.4°F | Ht 68.0 in | Wt 182.0 lb

## 2010-10-24 DIAGNOSIS — I2789 Other specified pulmonary heart diseases: Secondary | ICD-10-CM

## 2010-10-24 LAB — BASIC METABOLIC PANEL
Calcium: 9.1 mg/dL (ref 8.4–10.5)
GFR: 80.43 mL/min (ref >60.00)
Glucose, Bld: 132 mg/dL — ABNORMAL HIGH (ref 70–99)
Sodium: 139 mEq/L (ref 135–145)

## 2010-10-24 NOTE — Patient Instructions (Signed)
Please continue your same medications You can titrate up your oximizer to 6 -10L/min  CXR today Follow up with Dr Lamonte Sakai in 3 months.

## 2010-10-24 NOTE — Assessment & Plan Note (Signed)
Severe PAH on tyvaso + letaris - continue same - high flow O2 - turned down for tx at [facility] today

## 2010-10-24 NOTE — Progress Notes (Signed)
Addended by: Collene Gobble on: 10/24/2010 11:14 AM   Modules accepted: Orders

## 2010-10-24 NOTE — Progress Notes (Signed)
Subjective:    Patient ID: [name], male    DOB: [birth year], 60 y.o.   MRN: [id number]  HPI 60 yo man, former smoker with COPD, hx psoriatic arthritis and associated ILD, profound hypoxemia and associated Pulm HTN (PAP 57/26 mean 37, PAOP 14/11 mean 7 by cath 10/04/09. 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 Admitted 6/21-27 with resp failure and decompensated R heart failure. He was diuresed, treated with corticosteroids and started on bronchodilators for his COPD. O2 was titrated to 5L/min at rest, he is leaving it on 5 with exertion.   ROV 02/05/10 -- 60yo, PAH with Hx psoriatic arthritis, ILD and R heart failure, hypoxemia, on chronic pred 39m once daily. Also hx COPD.  On letaris since September '11. Since last time breathing may be a bit better. He has good days and bad days. Rarely uses ventolin. No HA, no syncope or near syncope. LE edema has been well controlled and stable. Was desaturated while walking today on presentation. Has been gaining some wt, aldactone was decreased by Dr SElisabeth Caraa month ago. PFTs done today.   ROV 04/16/10 -- Hx as above, follows up after TTE and 6 min walk. Returns feeling a bit more SOB w exertion. 6 min walk distance = 1634massoc with significant desat to 75% (after 2 min). Estimated RVSP 7953m(up from 59 in 09/2009). Has been on letaris since September. Edema seems better on lasix 71m31mo times a day. Clearly both TTE and walk distance are worse.   ROV 05/19/10 -- returns for f/u of the above issues. last time we started Tyvaso in addition to his letaris. Has been on it for a month. Up to 9 puffs 4x a day. Has been assoc with some HA, occas cough right after doing it, not intrusive. On Pred 10mg47miriva + ventoilin as needed. He is not noticing any big changes in his breathing yet on the Tyvaso. Monthly LFT's normal 1/17.   ROV 07/29/10 -- COPD, psoriatic arthritis + ILD, PAH. Regimen is Tyvaso + Letaris. [facility]. They have told him he needs screening CSY, EtOH and tobacco random screening, counseling. Tells me today that his breathing is stable, although he has had trouble w allergies and w the pollen. Taking spiriva + SABA. O2 is set on 6L/min via oximizer.   ROV 09/23/10 -- COPD, psoriatic arthritis + ILD, PAH. He is feeling well, has been doing pulm rehab. Has been maintained on Tyvaso, Letaris. [facility] >> cont on current regimen  POST HOSP 10/10/10  Pt returns for post hospital follow up  Pt was admitted   6/11-6/15 for acute on chronic resp failure w/ underlying PF/RA/PAH.  CT chest was neg for PE, chronic changes along with diffuse ASPDZ -?alveolitis vs pulm edema. BNP >2000 . At discharge BNP 548.  He did receive increased steroids and diuresis along pulmonary hygiene.   Since discharge he is better but still remains weak and wears out easily.  Ankles are still puffy esp in the evening. He says he is off his spironolactone but unclear why-no mention in discharge summary why this was held   Today BMET shows K+ 3.7    ROV 10/24/10 -- COPD, hx psoriatic arthritis and associated ILD, profound hypoxemia and associated Pulm HTN (PAP 57/26 mean 37, PAOP 14/11 mean 7 by cath 10/04/09. 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  Admitted 6/11 - 15, still quite limited, not back to full speed.  Biggest complaint is exertional dyspnea and hypoxemia - to 60's on oximizer 6L/min. He continues to have exertional hypoxemia.     Review of Systems Constitutional:   No  weight loss, night sweats,  Fevers, chills,  +fatigue, or  lassitude.  HEENT:   No headaches,  Difficulty swallowing,  Tooth/dental problems, or  Sore throat,                No sneezing, itching, ear ache, nasal congestion, post nasal drip,   CV:  No chest pain,  Orthopnea, PND,   anasarca, dizziness, palpitations, syncope.   GI  No heartburn, indigestion, abdominal pain, nausea, vomiting, diarrhea,  change in bowel habits, loss of appetite, bloody stools.   Resp:  No excess mucus, no productive cough,    No coughing up of blood.  No change in color of mucus.  No wheezing.  No chest wall deformity  Skin: no rash or lesions.  GU: no dysuria, change in color of urine, no urgency or frequency.  No flank pain, no hematuria   MS:  No joint pain or swelling.  No decreased range of motion.  No back pain.  Psych:  No change in mood or affect. No depression or anxiety.  No memory loss.       Objective:   Physical Exam Gen: Pleasant, hyperpigmented face, no distress  ENT: No lesions,  mouth clear,  oropharynx clear, no postnasal drip, throat clearing  Neck: No JVD, no TMG, no carotid bruits  Lungs: No use of accessory muscles, no dullness to percussion, clear without rales or rhonchi  Cardiovascular: RRR, heart sounds normal, no murmur or gallops, no peripheral edema  Musculoskeletal: No deformities, no cyanosis or clubbing  Neuro: alert, non focal  Skin: Warm, no lesions or rashes, hyperpigmented face psoriatic patches along arms and legs       Assessment & Plan:  PULMONARY HYPERTENSION, SECONDARY Severe PAH on tyvaso + letaris - continue same - high flow O2 - turned down for tx at [facility] today

## 2010-10-27 ENCOUNTER — Encounter (HOSPITAL_COMMUNITY)
Admission: RE | Admit: 2010-10-27 | Discharge: 2010-10-27 | Disposition: A | Discharge status: Home / Self Care | Payer: BC Managed Care – PPO | Source: Ambulatory Visit | Attending: Emergency Medicine | Admitting: Emergency Medicine

## 2010-10-27 ENCOUNTER — Encounter (HOSPITAL_COMMUNITY): Payer: BC Managed Care – PPO

## 2010-10-27 DIAGNOSIS — J4489 Other specified chronic obstructive pulmonary disease: Secondary | ICD-10-CM | POA: Insufficient documentation

## 2010-10-27 DIAGNOSIS — J449 Chronic obstructive pulmonary disease, unspecified: Secondary | ICD-10-CM | POA: Insufficient documentation

## 2010-10-29 ENCOUNTER — Encounter (HOSPITAL_COMMUNITY): Payer: BC Managed Care – PPO

## 2010-10-31 ENCOUNTER — Encounter (HOSPITAL_COMMUNITY)
Admission: RE | Admit: 2010-10-31 | Discharge: 2010-10-31 | Disposition: A | Discharge status: Home / Self Care | Payer: BC Managed Care – PPO | Source: Ambulatory Visit | Attending: Emergency Medicine | Admitting: Emergency Medicine

## 2010-11-03 ENCOUNTER — Encounter (HOSPITAL_COMMUNITY)
Admission: RE | Admit: 2010-11-03 | Discharge: 2010-11-03 | Disposition: A | Discharge status: Home / Self Care | Payer: BC Managed Care – PPO | Source: Ambulatory Visit | Attending: Emergency Medicine | Admitting: Emergency Medicine

## 2010-11-03 ENCOUNTER — Encounter (HOSPITAL_COMMUNITY): Payer: BC Managed Care – PPO

## 2010-11-05 ENCOUNTER — Encounter (HOSPITAL_COMMUNITY): Payer: BC Managed Care – PPO

## 2010-11-06 ENCOUNTER — Other Ambulatory Visit: Payer: Self-pay | Admitting: Emergency Medicine

## 2010-11-07 ENCOUNTER — Encounter (HOSPITAL_COMMUNITY)
Admission: RE | Admit: 2010-11-07 | Discharge: 2010-11-07 | Disposition: A | Discharge status: Home / Self Care | Payer: BC Managed Care – PPO | Source: Ambulatory Visit | Attending: Emergency Medicine | Admitting: Emergency Medicine

## 2010-11-10 ENCOUNTER — Encounter (HOSPITAL_COMMUNITY): Payer: BC Managed Care – PPO

## 2010-11-12 ENCOUNTER — Encounter (HOSPITAL_COMMUNITY): Payer: BC Managed Care – PPO

## 2010-11-14 ENCOUNTER — Encounter (HOSPITAL_COMMUNITY): Payer: BC Managed Care – PPO

## 2010-11-14 ENCOUNTER — Other Ambulatory Visit: Payer: Self-pay

## 2010-11-17 ENCOUNTER — Encounter (HOSPITAL_COMMUNITY): Payer: BC Managed Care – PPO

## 2010-11-17 ENCOUNTER — Encounter (HOSPITAL_COMMUNITY)
Admission: RE | Admit: 2010-11-17 | Discharge: 2010-11-17 | Disposition: A | Discharge status: Home / Self Care | Payer: BC Managed Care – PPO | Source: Ambulatory Visit | Attending: Emergency Medicine | Admitting: Emergency Medicine

## 2010-11-18 ENCOUNTER — Other Ambulatory Visit: Payer: Self-pay | Admitting: Emergency Medicine

## 2010-11-18 NOTE — Telephone Encounter (Signed)
Cecille Rubin, have you seen a request for pt's letaris come across your desk?

## 2010-11-19 ENCOUNTER — Ambulatory Visit: Payer: BC Managed Care – PPO

## 2010-11-19 ENCOUNTER — Encounter (HOSPITAL_COMMUNITY): Payer: BC Managed Care – PPO

## 2010-11-19 ENCOUNTER — Telehealth: Payer: Self-pay | Admitting: Emergency Medicine

## 2010-11-19 DIAGNOSIS — I2789 Other specified pulmonary heart diseases: Secondary | ICD-10-CM

## 2010-11-19 LAB — BASIC METABOLIC PANEL
CO2: 30 mEq/L (ref 19–32)
Chloride: 101 mEq/L (ref 96–112)
Creatinine, Ser: 1 mg/dL (ref 0.4–1.5)
Potassium: 3.7 mEq/L (ref 3.5–5.1)

## 2010-11-19 MED ORDER — AMBRISENTAN 5 MG PO TABS: 5.0000 mg | ORAL_TABLET | Freq: Every day | ORAL | Status: DC

## 2010-11-19 NOTE — Telephone Encounter (Signed)
Tried to call him but unable to get him on phone. Based on my last note, plans were to continue the letaris. We should reorder.

## 2010-11-19 NOTE — Telephone Encounter (Signed)
Spoke with pt pharmacist and she states this was already taken care of by Cecille Rubin. Rx for letairis has been reordered and nothing further needed.

## 2010-11-19 NOTE — Telephone Encounter (Signed)
Refill for Letairis 5 mg called to [facility]. Verbal order given to [employer], pharmacist.

## 2010-11-19 NOTE — Telephone Encounter (Signed)
Pharmacy calling to verify rx for letairis, but per last ov it states that this was d/c'ed. Will forward to RB to verify. Please advise, thanks!

## 2010-11-20 LAB — HEPATIC FUNCTION PANEL
Albumin: 4.3 g/dL (ref 3.5–5.2)
Alkaline Phosphatase: 39 U/L (ref 39–117)

## 2010-11-21 ENCOUNTER — Encounter (HOSPITAL_COMMUNITY)
Admission: RE | Admit: 2010-11-21 | Discharge: 2010-11-21 | Disposition: A | Discharge status: Home / Self Care | Payer: BC Managed Care – PPO | Source: Ambulatory Visit | Attending: Emergency Medicine | Admitting: Emergency Medicine

## 2010-11-21 DIAGNOSIS — J449 Chronic obstructive pulmonary disease, unspecified: Secondary | ICD-10-CM | POA: Insufficient documentation

## 2010-11-21 DIAGNOSIS — J4489 Other specified chronic obstructive pulmonary disease: Secondary | ICD-10-CM | POA: Insufficient documentation

## 2010-11-24 ENCOUNTER — Encounter (HOSPITAL_COMMUNITY): Payer: BC Managed Care – PPO

## 2010-11-25 ENCOUNTER — Telehealth: Payer: Self-pay | Admitting: *Deleted

## 2010-11-25 NOTE — Telephone Encounter (Signed)
Tyvaso has been APPROVED by the pt's insurance for 11/19/2010 thru 11/19/2011. Approval will be scanned into the pt's chart.

## 2010-11-26 ENCOUNTER — Encounter (HOSPITAL_COMMUNITY): Payer: BC Managed Care – PPO

## 2010-11-28 ENCOUNTER — Encounter (HOSPITAL_COMMUNITY): Payer: BC Managed Care – PPO

## 2010-12-01 ENCOUNTER — Encounter (HOSPITAL_COMMUNITY)
Admission: RE | Admit: 2010-12-01 | Discharge: 2010-12-01 | Disposition: A | Discharge status: Home / Self Care | Payer: BC Managed Care – PPO | Source: Ambulatory Visit | Attending: Emergency Medicine | Admitting: Emergency Medicine

## 2010-12-01 ENCOUNTER — Encounter (HOSPITAL_COMMUNITY): Payer: BC Managed Care – PPO

## 2010-12-03 ENCOUNTER — Encounter (HOSPITAL_COMMUNITY): Payer: BC Managed Care – PPO

## 2010-12-05 ENCOUNTER — Encounter (HOSPITAL_COMMUNITY)
Admission: RE | Admit: 2010-12-05 | Discharge: 2010-12-05 | Disposition: A | Discharge status: Home / Self Care | Payer: BC Managed Care – PPO | Source: Ambulatory Visit | Attending: Emergency Medicine | Admitting: Emergency Medicine

## 2010-12-08 ENCOUNTER — Encounter (HOSPITAL_COMMUNITY): Payer: BC Managed Care – PPO

## 2010-12-08 ENCOUNTER — Encounter (HOSPITAL_COMMUNITY)
Admission: RE | Admit: 2010-12-08 | Discharge: 2010-12-08 | Disposition: A | Discharge status: Home / Self Care | Payer: BC Managed Care – PPO | Source: Ambulatory Visit | Attending: Emergency Medicine | Admitting: Emergency Medicine

## 2010-12-10 ENCOUNTER — Other Ambulatory Visit: Payer: Self-pay | Admitting: Emergency Medicine

## 2010-12-10 ENCOUNTER — Encounter (HOSPITAL_COMMUNITY): Payer: BC Managed Care – PPO

## 2010-12-12 ENCOUNTER — Encounter (HOSPITAL_COMMUNITY)
Admission: RE | Admit: 2010-12-12 | Discharge: 2010-12-12 | Disposition: A | Discharge status: Home / Self Care | Payer: BC Managed Care – PPO | Source: Ambulatory Visit | Attending: Emergency Medicine | Admitting: Emergency Medicine

## 2010-12-15 ENCOUNTER — Encounter (HOSPITAL_COMMUNITY): Payer: BC Managed Care – PPO

## 2010-12-15 ENCOUNTER — Encounter (HOSPITAL_COMMUNITY)
Admission: RE | Admit: 2010-12-15 | Discharge: 2010-12-15 | Disposition: A | Discharge status: Home / Self Care | Payer: BC Managed Care – PPO | Source: Ambulatory Visit | Attending: Emergency Medicine | Admitting: Emergency Medicine

## 2010-12-17 ENCOUNTER — Encounter (HOSPITAL_COMMUNITY): Payer: BC Managed Care – PPO

## 2010-12-19 ENCOUNTER — Encounter (HOSPITAL_COMMUNITY)
Admission: RE | Admit: 2010-12-19 | Discharge: 2010-12-19 | Disposition: A | Discharge status: Home / Self Care | Payer: BC Managed Care – PPO | Source: Ambulatory Visit | Attending: Emergency Medicine | Admitting: Emergency Medicine

## 2010-12-26 ENCOUNTER — Encounter (HOSPITAL_COMMUNITY)
Admission: RE | Admit: 2010-12-26 | Discharge: 2010-12-26 | Disposition: A | Discharge status: Home / Self Care | Payer: BC Managed Care – PPO | Source: Ambulatory Visit | Attending: Emergency Medicine | Admitting: Emergency Medicine

## 2010-12-26 DIAGNOSIS — J4489 Other specified chronic obstructive pulmonary disease: Secondary | ICD-10-CM | POA: Insufficient documentation

## 2010-12-26 DIAGNOSIS — J449 Chronic obstructive pulmonary disease, unspecified: Secondary | ICD-10-CM | POA: Insufficient documentation

## 2010-12-29 ENCOUNTER — Other Ambulatory Visit: Payer: Self-pay | Admitting: Emergency Medicine

## 2010-12-29 ENCOUNTER — Encounter (HOSPITAL_COMMUNITY)
Admission: RE | Admit: 2010-12-29 | Discharge: 2010-12-29 | Disposition: A | Discharge status: Home / Self Care | Payer: BC Managed Care – PPO | Source: Ambulatory Visit | Attending: Emergency Medicine | Admitting: Emergency Medicine

## 2010-12-30 ENCOUNTER — Telehealth: Payer: Self-pay | Admitting: Emergency Medicine

## 2010-12-30 NOTE — Telephone Encounter (Signed)
Pt aware of RB's response and states he will come the 1st of November to have this done.

## 2010-12-30 NOTE — Telephone Encounter (Signed)
I spoke with pt and he states [name] gave him a list of dates he was suppose to have blood tests done. Pt states he has finished the dates to have blood drawn. Pt wants to know if he needs to continue to have blood drawn every month. Please advise Dr. Lamonte Sakai. Thanks  Charma Igo, [place]

## 2010-12-30 NOTE — Telephone Encounter (Signed)
We can decrease the frequency to every 3rd month - he needs LFT's to be ordered for these times starting 3 months after his most recent. Thanks.

## 2010-12-31 ENCOUNTER — Other Ambulatory Visit: Payer: Self-pay | Admitting: Emergency Medicine

## 2011-01-02 ENCOUNTER — Encounter (HOSPITAL_COMMUNITY): Payer: BC Managed Care – PPO

## 2011-01-05 ENCOUNTER — Encounter (HOSPITAL_COMMUNITY)
Admission: RE | Admit: 2011-01-05 | Discharge: 2011-01-05 | Disposition: A | Discharge status: Home / Self Care | Payer: BC Managed Care – PPO | Source: Ambulatory Visit | Attending: Emergency Medicine | Admitting: Emergency Medicine

## 2011-01-05 ENCOUNTER — Telehealth: Payer: Self-pay | Admitting: Emergency Medicine

## 2011-01-05 NOTE — Telephone Encounter (Signed)
lmomtcb

## 2011-01-06 ENCOUNTER — Telehealth: Payer: Self-pay | Admitting: *Deleted

## 2011-01-06 MED ORDER — PANTOPRAZOLE SODIUM 40 MG PO TBEC: 40.0000 mg | DELAYED_RELEASE_TABLET | Freq: Every day | ORAL | Status: DC

## 2011-01-06 NOTE — Telephone Encounter (Signed)
lmtc

## 2011-01-06 NOTE — Telephone Encounter (Signed)
Pt request a 30 day supply of protonix be sent to his local pharmacy [facility] and then a [facility] day supply to [facility]. Refills sent. Pt aware. [place] Bing, CMA

## 2011-01-07 NOTE — Telephone Encounter (Signed)
LMOMTCB x 1 

## 2011-01-07 NOTE — Telephone Encounter (Signed)
Pt states he spoke with the nurse yesterday and they were going to send a refill for his Protonix to [facility] [address]. For 30 day supply until mail order arrives. This was done and pt is aware.

## 2011-01-09 ENCOUNTER — Encounter (HOSPITAL_COMMUNITY)
Admission: RE | Admit: 2011-01-09 | Discharge: 2011-01-09 | Disposition: A | Discharge status: Home / Self Care | Payer: BC Managed Care – PPO | Source: Ambulatory Visit | Attending: Emergency Medicine | Admitting: Emergency Medicine

## 2011-01-12 ENCOUNTER — Encounter (HOSPITAL_COMMUNITY)
Admission: RE | Admit: 2011-01-12 | Discharge: 2011-01-12 | Disposition: A | Discharge status: Home / Self Care | Payer: BC Managed Care – PPO | Source: Ambulatory Visit | Attending: Emergency Medicine | Admitting: Emergency Medicine

## 2011-01-16 ENCOUNTER — Encounter (HOSPITAL_COMMUNITY)
Admission: RE | Admit: 2011-01-16 | Discharge: 2011-01-16 | Disposition: A | Discharge status: Home / Self Care | Payer: BC Managed Care – PPO | Source: Ambulatory Visit | Attending: Emergency Medicine | Admitting: Emergency Medicine

## 2011-01-19 ENCOUNTER — Encounter (HOSPITAL_COMMUNITY)
Admission: RE | Admit: 2011-01-19 | Discharge: 2011-01-19 | Disposition: A | Discharge status: Home / Self Care | Payer: BC Managed Care – PPO | Source: Ambulatory Visit | Attending: Pulmonary Disease | Admitting: Pulmonary Disease

## 2011-01-19 DIAGNOSIS — J449 Chronic obstructive pulmonary disease, unspecified: Secondary | ICD-10-CM | POA: Insufficient documentation

## 2011-01-19 DIAGNOSIS — J4489 Other specified chronic obstructive pulmonary disease: Secondary | ICD-10-CM | POA: Insufficient documentation

## 2011-01-20 ENCOUNTER — Encounter: Payer: Self-pay | Admitting: Emergency Medicine

## 2011-01-20 ENCOUNTER — Ambulatory Visit (INDEPENDENT_AMBULATORY_CARE_PROVIDER_SITE_OTHER): Payer: BC Managed Care – PPO | Admitting: Emergency Medicine

## 2011-01-20 DIAGNOSIS — J449 Chronic obstructive pulmonary disease, unspecified: Secondary | ICD-10-CM

## 2011-01-20 DIAGNOSIS — I2789 Other specified pulmonary heart diseases: Secondary | ICD-10-CM

## 2011-01-20 NOTE — Assessment & Plan Note (Signed)
-  Spiriva  -prn SABA

## 2011-01-20 NOTE — Patient Instructions (Signed)
Please continue your oxygen and all of your medications as you are taking them Get your liver blood work done in November and every 3 months We discussed making a living will and resuscitation orders today. We will revisit this next time.  Follow with Dr Lamonte Sakai in 4 months or sooner if you have any problems.

## 2011-01-20 NOTE — Assessment & Plan Note (Signed)
-  continue high flow o2 - tyvaso - letaris, follow LFT q50month

## 2011-01-20 NOTE — Progress Notes (Signed)
Subjective:    Patient ID: [name], male    DOB: [birth year], 60 y.o.   MRN: [id number]  HPI 60 yo man, former smoker with COPD, hx psoriatic arthritis and associated ILD, profound hypoxemia and associated Pulm HTN (PAP 57/26 mean 37, PAOP 14/11 mean 7 by cath 10/04/09. 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 Admitted 6/21-27 with resp failure and decompensated R heart failure. He was diuresed, treated with corticosteroids and started on bronchodilators for his COPD. O2 was titrated to 5L/min at rest, he is leaving it on 5 with exertion.   ROV 02/05/10 -- 60yo, PAH with Hx psoriatic arthritis, ILD and R heart failure, hypoxemia, on chronic pred 39m once daily. Also hx COPD.  On letaris since September '11. Since last time breathing may be a bit better. He has good days and bad days. Rarely uses ventolin. No HA, no syncope or near syncope. LE edema has been well controlled and stable. Was desaturated while walking today on presentation. Has been gaining some wt, aldactone was decreased by Dr SElisabeth Caraa month ago. PFTs done today.   ROV 04/16/10 -- Hx as above, follows up after TTE and 6 min walk. Returns feeling a bit more SOB w exertion. 6 min walk distance = 1634massoc with significant desat to 75% (after 2 min). Estimated RVSP 7953m(up from 59 in 09/2009). Has been on letaris since September. Edema seems better on lasix 71m31mo times a day. Clearly both TTE and walk distance are worse.   ROV 05/19/10 -- returns for f/u of the above issues. last time we started Tyvaso in addition to his letaris. Has been on it for a month. Up to 9 puffs 4x a day. Has been assoc with some HA, occas cough right after doing it, not intrusive. On Pred 10mg47miriva + ventoilin as needed. He is not noticing any big changes in his breathing yet on the Tyvaso. Monthly LFT's normal 1/17.   ROV 07/29/10 -- COPD, psoriatic arthritis + ILD, PAH. Regimen is Tyvaso + Letaris. [facility] They have told him he needs screening CSY, EtOH and tobacco random screening, counseling. Tells me today that his breathing is stable, although he has had trouble w allergies and w the pollen. Taking spiriva + SABA. O2 is set on 6L/min via oximizer.   ROV 09/23/10 -- COPD, psoriatic arthritis + ILD, PAH. He is feeling well, has been doing pulm rehab. Has been maintained on Tyvaso, Letaris. [facility] >> cont on current regimen  POST HOSP 10/10/10  Pt returns for post hospital follow up  Pt was admitted   6/11-6/15 for acute on chronic resp failure w/ underlying PF/RA/PAH.  CT chest was neg for PE, chronic changes along with diffuse ASPDZ -?alveolitis vs pulm edema. BNP >2000 . At discharge BNP 548.  He did receive increased steroids and diuresis along pulmonary hygiene.   Since discharge he is better but still remains weak and wears out easily.  Ankles are still puffy esp in the evening. He says he is off his spironolactone but unclear why-no mention in discharge summary why this was held   Today BMET shows K+ 3.7    ROV 10/24/10 -- COPD, hx psoriatic arthritis and associated ILD, profound hypoxemia and associated Pulm HTN (PAP 57/26 mean 37, PAOP 14/11 mean 7 by cath 10/04/09. 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  Admitted 6/11 - 15, still quite limited, not back to full speed.  Biggest complaint is exertional dyspnea and hypoxemia - to 60's on oximizer 6L/min. He continues to have exertional hypoxemia.   ROV 01/20/11 -- COPD, hx psoriatic arthritis (Dr Tonia Brooms) and associated ILD, profound hypoxemia and associated Pulm HTN (PAP 57/26 mean 37, PAOP 14/11 mean 7 by cath 10/04/09). 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 We had him evaluated for possible transplant, turned down at [facility]. Using 5-6L/min via oximizer at rest, up to 8-10L/min when moving. Using Spiriva and albuterol prn. Continues to have exertional SOB, may be worse. He is coughing more,  has nasal congestion last few days. Taking zyrtec. He is using some saline. Has humidity in his O2 concentrator. Still in the [facility] pulm rehab maintenance program.       Objective:   Physical Exam Gen: Pleasant, hyperpigmented face, no distress  ENT: No lesions,  mouth clear,  oropharynx clear, no postnasal drip, throat clearing  Neck: No JVD, no TMG, no carotid bruits  Lungs: No use of accessory muscles, no dullness to percussion, clear without rales or rhonchi  Cardiovascular: RRR, heart sounds normal, no murmur or gallops, no peripheral edema  Musculoskeletal: No deformities, no cyanosis or clubbing  Neuro: alert, non focal  Skin: Warm, no lesions or rashes, hyperpigmented face psoriatic patches along arms and legs    Assessment & Plan:    PULMONARY HYPERTENSION, SECONDARY - continue high flow o2 - tyvaso - letaris, follow LFT q74month  C O P D -Spiriva  -prn SABA

## 2011-01-23 ENCOUNTER — Telehealth: Payer: Self-pay | Admitting: Emergency Medicine

## 2011-01-23 ENCOUNTER — Encounter (HOSPITAL_COMMUNITY)
Admission: RE | Admit: 2011-01-23 | Discharge: 2011-01-23 | Disposition: A | Discharge status: Home / Self Care | Payer: BC Managed Care – PPO | Source: Ambulatory Visit | Attending: Emergency Medicine | Admitting: Emergency Medicine

## 2011-01-23 NOTE — Telephone Encounter (Signed)
Pt returned call & can be reached at [phone].  [name]

## 2011-01-23 NOTE — Telephone Encounter (Signed)
lmomtcb

## 2011-01-23 NOTE — Telephone Encounter (Signed)
I spoke with pt and he states he went to pulmonary rehab today and was sent home. He states they listened to him and stated he had a lot of chest congestion. Pt c/o cough w/ very little white phlem, little wheezing, chest sore form all the coughing. Pt states this has been going on x Tuesday. Since RB is not in the office will forward to Dr. Annamaria Boots for recs for pt. Please advise Dr. Annamaria Boots. Thanks  No Known Allergies  Charma Igo, CMA

## 2011-01-23 NOTE — Telephone Encounter (Signed)
Per CY-okay to give Prednisone 89m #20 take 4 x 2 days, 3 x 2 days, 2 x 2 days, 1 x 2 days, then stop no refills.

## 2011-01-23 NOTE — Telephone Encounter (Signed)
Called, spoke with pt.  He was informed of CDY's recs and verbalized understanding of how he is to take this taper.  States he already has 68m prednisone tablets and does not need a rx at this time.  I did advise pt if symptoms worsen to call back or seek emergency care.  He verbalized understanding of these instructions.  Nothing further needed at this time.  Will forward message to RB as FYI.

## 2011-01-26 ENCOUNTER — Encounter (HOSPITAL_COMMUNITY): Payer: BC Managed Care – PPO

## 2011-01-30 ENCOUNTER — Encounter (HOSPITAL_COMMUNITY)
Admission: RE | Admit: 2011-01-30 | Discharge: 2011-01-30 | Disposition: A | Discharge status: Home / Self Care | Payer: BC Managed Care – PPO | Source: Ambulatory Visit | Attending: Emergency Medicine | Admitting: Emergency Medicine

## 2011-02-02 ENCOUNTER — Encounter (HOSPITAL_COMMUNITY): Payer: BC Managed Care – PPO

## 2011-02-06 ENCOUNTER — Encounter (HOSPITAL_COMMUNITY)
Admission: RE | Admit: 2011-02-06 | Discharge: 2011-02-06 | Disposition: A | Discharge status: Home / Self Care | Payer: BC Managed Care – PPO | Source: Ambulatory Visit | Attending: Emergency Medicine | Admitting: Emergency Medicine

## 2011-02-09 ENCOUNTER — Encounter (HOSPITAL_COMMUNITY)
Admission: RE | Admit: 2011-02-09 | Discharge: 2011-02-09 | Disposition: A | Discharge status: Home / Self Care | Payer: BC Managed Care – PPO | Source: Ambulatory Visit | Attending: Emergency Medicine | Admitting: Emergency Medicine

## 2011-02-13 ENCOUNTER — Encounter (HOSPITAL_COMMUNITY)
Admission: RE | Admit: 2011-02-13 | Discharge: 2011-02-13 | Disposition: A | Discharge status: Home / Self Care | Payer: BC Managed Care – PPO | Source: Ambulatory Visit | Attending: Emergency Medicine | Admitting: Emergency Medicine

## 2011-02-16 ENCOUNTER — Encounter (HOSPITAL_COMMUNITY)
Admission: RE | Admit: 2011-02-16 | Discharge: 2011-02-16 | Disposition: A | Discharge status: Home / Self Care | Payer: BC Managed Care – PPO | Source: Ambulatory Visit | Attending: Emergency Medicine | Admitting: Emergency Medicine

## 2011-02-18 ENCOUNTER — Telehealth: Payer: Self-pay | Admitting: Emergency Medicine

## 2011-02-18 MED ORDER — AMBRISENTAN 5 MG PO TABS
5.0000 mg | ORAL_TABLET | Freq: Every day | ORAL | Status: DC
Start: 2011-02-18 — End: 2011-07-15

## 2011-02-18 NOTE — Telephone Encounter (Signed)
I spoke with bill from [facility] and advised him of the refill for letaris. He states he will get rx out to patient. Nothing further was needed

## 2011-02-20 ENCOUNTER — Encounter (HOSPITAL_COMMUNITY): Payer: BC Managed Care – PPO

## 2011-02-23 ENCOUNTER — Encounter (HOSPITAL_COMMUNITY): Payer: BC Managed Care – PPO

## 2011-02-26 ENCOUNTER — Other Ambulatory Visit: Payer: Self-pay

## 2011-02-26 ENCOUNTER — Telehealth: Payer: Self-pay | Admitting: Emergency Medicine

## 2011-02-26 MED ORDER — PREDNISONE 10 MG PO TABS: 10.0000 mg | ORAL_TABLET | Freq: Every day | ORAL | Status: DC

## 2011-02-26 NOTE — Telephone Encounter (Signed)
lmom informing [facility]

## 2011-02-27 ENCOUNTER — Encounter (HOSPITAL_COMMUNITY): Payer: BC Managed Care – PPO

## 2011-02-27 ENCOUNTER — Other Ambulatory Visit: Payer: Self-pay | Admitting: Emergency Medicine

## 2011-03-02 ENCOUNTER — Encounter (HOSPITAL_COMMUNITY)
Admission: RE | Admit: 2011-03-02 | Discharge: 2011-03-02 | Disposition: A | Discharge status: Home / Self Care | Payer: BC Managed Care – PPO | Source: Ambulatory Visit | Attending: Emergency Medicine | Admitting: Emergency Medicine

## 2011-03-02 DIAGNOSIS — J4489 Other specified chronic obstructive pulmonary disease: Secondary | ICD-10-CM | POA: Insufficient documentation

## 2011-03-02 DIAGNOSIS — J449 Chronic obstructive pulmonary disease, unspecified: Secondary | ICD-10-CM | POA: Insufficient documentation

## 2011-03-04 ENCOUNTER — Other Ambulatory Visit (INDEPENDENT_AMBULATORY_CARE_PROVIDER_SITE_OTHER): Payer: BC Managed Care – PPO

## 2011-03-04 ENCOUNTER — Telehealth: Payer: Self-pay | Admitting: Emergency Medicine

## 2011-03-04 DIAGNOSIS — I2789 Other specified pulmonary heart diseases: Secondary | ICD-10-CM

## 2011-03-04 LAB — HEPATIC FUNCTION PANEL
Albumin: 4.1 g/dL (ref 3.5–5.2)
Total Protein: 7 g/dL (ref 6.0–8.3)

## 2011-03-04 NOTE — Telephone Encounter (Signed)
LMTCB.

## 2011-03-05 ENCOUNTER — Ambulatory Visit (INDEPENDENT_AMBULATORY_CARE_PROVIDER_SITE_OTHER): Payer: BC Managed Care – PPO | Admitting: *Deleted

## 2011-03-05 DIAGNOSIS — I739 Peripheral vascular disease, unspecified: Secondary | ICD-10-CM

## 2011-03-05 NOTE — Telephone Encounter (Signed)
I spoke with pt and he states he would like his lab work from yesterday faxed over to Dr. Tonia Brooms. I advised pt will fax results per his request and nothing further was needed

## 2011-03-06 ENCOUNTER — Encounter (HOSPITAL_COMMUNITY): Payer: BC Managed Care – PPO

## 2011-03-06 ENCOUNTER — Telehealth: Payer: Self-pay | Admitting: Emergency Medicine

## 2011-03-06 NOTE — Telephone Encounter (Signed)
Please inform pt that LFT are normal. Please continue same medications. RSB I spoke with patient about results and he verbalized understanding and had no questions

## 2011-03-09 ENCOUNTER — Encounter (HOSPITAL_COMMUNITY)
Admission: RE | Admit: 2011-03-09 | Discharge: 2011-03-09 | Disposition: A | Discharge status: Home / Self Care | Payer: BC Managed Care – PPO | Source: Ambulatory Visit | Attending: Emergency Medicine | Admitting: Emergency Medicine

## 2011-03-13 ENCOUNTER — Encounter (HOSPITAL_COMMUNITY): Payer: BC Managed Care – PPO

## 2011-03-16 ENCOUNTER — Encounter (HOSPITAL_COMMUNITY)
Admission: RE | Admit: 2011-03-16 | Discharge: 2011-03-16 | Disposition: A | Discharge status: Home / Self Care | Payer: BC Managed Care – PPO | Source: Ambulatory Visit | Attending: Emergency Medicine | Admitting: Emergency Medicine

## 2011-03-20 ENCOUNTER — Encounter (HOSPITAL_COMMUNITY)
Admission: RE | Admit: 2011-03-20 | Discharge: 2011-03-20 | Disposition: A | Discharge status: Home / Self Care | Payer: BC Managed Care – PPO | Source: Ambulatory Visit | Attending: Emergency Medicine | Admitting: Emergency Medicine

## 2011-03-23 ENCOUNTER — Encounter (HOSPITAL_COMMUNITY)
Admission: RE | Admit: 2011-03-23 | Discharge: 2011-03-23 | Disposition: A | Discharge status: Home / Self Care | Payer: BC Managed Care – PPO | Source: Ambulatory Visit | Attending: Emergency Medicine | Admitting: Emergency Medicine

## 2011-03-23 DIAGNOSIS — J449 Chronic obstructive pulmonary disease, unspecified: Secondary | ICD-10-CM | POA: Insufficient documentation

## 2011-03-23 DIAGNOSIS — J4489 Other specified chronic obstructive pulmonary disease: Secondary | ICD-10-CM | POA: Insufficient documentation

## 2011-03-27 ENCOUNTER — Encounter (HOSPITAL_COMMUNITY): Payer: BC Managed Care – PPO

## 2011-03-30 ENCOUNTER — Encounter (HOSPITAL_COMMUNITY)
Admission: RE | Admit: 2011-03-30 | Discharge: 2011-03-30 | Disposition: A | Discharge status: Home / Self Care | Payer: BC Managed Care – PPO | Source: Ambulatory Visit | Attending: Emergency Medicine | Admitting: Emergency Medicine

## 2011-04-03 ENCOUNTER — Encounter (HOSPITAL_COMMUNITY): Payer: BC Managed Care – PPO

## 2011-04-06 ENCOUNTER — Encounter (HOSPITAL_COMMUNITY): Payer: BC Managed Care – PPO

## 2011-04-10 ENCOUNTER — Encounter (HOSPITAL_COMMUNITY): Payer: BC Managed Care – PPO

## 2011-04-13 ENCOUNTER — Encounter (HOSPITAL_COMMUNITY): Payer: BC Managed Care – PPO

## 2011-04-22 ENCOUNTER — Telehealth: Payer: Self-pay | Admitting: Emergency Medicine

## 2011-04-22 NOTE — Telephone Encounter (Signed)
Spoke with pharmacist at [facility]. After being placed on hold for approx 10 min, was informed that they do not have record of calling our office regarding this pt. I asked if they still carry tyvaso and she states that they do, and again she is unsure of who called and why they called. Will close the encounter.

## 2011-04-23 ENCOUNTER — Telehealth: Payer: Self-pay | Admitting: Emergency Medicine

## 2011-04-23 MED ORDER — PANTOPRAZOLE SODIUM 40 MG PO TBEC: 40.0000 mg | DELAYED_RELEASE_TABLET | Freq: Every day | ORAL | Status: DC

## 2011-04-23 NOTE — Telephone Encounter (Signed)
SPOKE WITH PATIENT NO LONGERS USES [facility]. REQUESTED PROTONIX TO BE [facility]ED [facility] [address]. SENT PROTONIX TO PHARMACY

## 2011-05-06 ENCOUNTER — Telehealth: Payer: Self-pay | Admitting: Emergency Medicine

## 2011-05-06 MED ORDER — TREPROSTINIL 0.6 MG/ML IN SOLN: RESPIRATORY_TRACT | Status: AC

## 2011-05-06 NOTE — Telephone Encounter (Signed)
I spoke w/ [facility] and was advised me that 81.2 ml is for a month supply. I have printed off rx and lori will have RB to sign and sed to [facility]. Will hold in lori box until then

## 2011-05-06 NOTE — Telephone Encounter (Signed)
Yes thank you please refill

## 2011-05-06 NOTE — Telephone Encounter (Signed)
Please advise if okay to refill as requested. Thanks.  

## 2011-05-08 NOTE — Telephone Encounter (Signed)
RX signed by RB and this has been faxed back to the pharmacy.

## 2011-05-11 ENCOUNTER — Telehealth: Payer: Self-pay | Admitting: Emergency Medicine

## 2011-05-11 NOTE — Telephone Encounter (Signed)
Spoke with Semra. She states that they are needing new rx for tyvaso, along with all ov noted pertaining to the pt's dx of PAH.  I faxed rx (which had already been printed and signed by RB on 1.16.3) and all of the pt's ov notes since he has been seeing RB to the fax number given. Nothing further needed per caller.

## 2011-05-18 ENCOUNTER — Encounter: Payer: Self-pay | Admitting: Emergency Medicine

## 2011-05-18 ENCOUNTER — Ambulatory Visit (INDEPENDENT_AMBULATORY_CARE_PROVIDER_SITE_OTHER): Payer: BC Managed Care – PPO | Admitting: Emergency Medicine

## 2011-05-18 DIAGNOSIS — L408 Other psoriasis: Secondary | ICD-10-CM

## 2011-05-18 DIAGNOSIS — J449 Chronic obstructive pulmonary disease, unspecified: Secondary | ICD-10-CM

## 2011-05-18 DIAGNOSIS — I2789 Other specified pulmonary heart diseases: Secondary | ICD-10-CM

## 2011-05-18 NOTE — Assessment & Plan Note (Signed)
Appears to be fairly stable although slowly losing some functional capacity on high flow oxygen, Letaris, Tyvaso.  - We will need to consider a transition to IV therapy in the future, possibly Flolan - Continue same medications for now - I will obtain his right heart cath information from [place] - He will perform a 6 minute walk at his followup visit in 4 months - Good discussion today regarding goals of care and CODE STATUS. He wants all appropriate medical therapy but does not want to undergo CPR or to be mechanically ventilated. I will fill out no CODE BLUE orders today

## 2011-05-18 NOTE — Assessment & Plan Note (Signed)
-  continue same inhaled regimen

## 2011-05-18 NOTE — Patient Instructions (Signed)
A DO NOT RESUSCITATE order was signed today Continue your Tyvaso and Letaris.  Continue your current oxygen We will perform a 6 minute walk on the same date as your follow up appointment\ Follow with Dr. Lamonte Sakai in 4 months or sooner if you have any problems

## 2011-05-18 NOTE — Assessment & Plan Note (Signed)
With psoriatic arthritis - On prednisone and Humira

## 2011-05-18 NOTE — Progress Notes (Signed)
Subjective:    Patient ID: [name], male    DOB: 26-Feb-1951, 61 y.o.   MRN: [id number] HPI 61 yo man, former smoker with COPD, hx psoriatic arthritis and associated ILD, profound hypoxemia and associated Pulm HTN (PAP 57/26 mean 37, PAOP 14/11 mean 7 by cath 10/04/09. 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 Admitted 6/21-27 with resp failure and decompensated R heart failure. He was diuresed, treated with corticosteroids and started on bronchodilators for his COPD. O2 was titrated to 5L/min at rest, he is leaving it on 5 with exertion.   ROV 02/05/10 -- 60yo, PAH with Hx psoriatic arthritis, ILD and R heart failure, hypoxemia, on chronic pred 61m once daily. Also hx COPD.  On letaris since September '11. Since last time breathing may be a bit better. He has good days and bad days. Rarely uses ventolin. No HA, no syncope or near syncope. LE edema has been well controlled and stable. Was desaturated while walking today on presentation. Has been gaining some wt, aldactone was decreased by Dr SElisabeth Caraa month ago. PFTs done today.   ROV 04/16/10 -- Hx as above, follows up after TTE and 6 min walk. Returns feeling a bit more SOB w exertion. 6 min walk distance = 1673massoc with significant desat to 75% (after 2 min). Estimated RVSP 7913m(up from 59 in 09/2009). Has been on letaris since September. Edema seems better on lasix 38m65mo times a day. Clearly both TTE and walk distance are worse.   ROV 05/19/10 -- returns for f/u of the above issues. last time we started Tyvaso in addition to his letaris. Has been on it for a month. Up to 9 puffs 4x a day. Has been assoc with some HA, occas cough right after doing it, not intrusive. On Pred 10mg66miriva + ventoilin as needed. He is not noticing any big changes in his breathing yet on the Tyvaso. Monthly LFT's normal 1/17.   ROV 07/29/10 -- COPD, psoriatic arthritis + ILD, PAH. Regimen is Tyvaso + Letaris. [facility] They have told him he needs screening CSY, EtOH and tobacco random screening, counseling. Tells me today that his breathing is stable, although he has had trouble w allergies and w the pollen. Taking spiriva + SABA. O2 is set on 6L/min via oximizer.   ROV 09/23/10 -- COPD, psoriatic arthritis + ILD, PAH. He is feeling well, has been doing pulm rehab. Has been maintained on Tyvaso, Letaris. [facility]. >> cont on current regimen  POST HOSP 10/10/10  Pt returns for post hospital follow up  Pt was admitted   6/11-6/15 for acute on chronic resp failure w/ underlying PF/RA/PAH.  CT chest was neg for PE, chronic changes along with diffuse ASPDZ -?alveolitis vs pulm edema. BNP >2000 . At discharge BNP 548.  He did receive increased steroids and diuresis along pulmonary hygiene.   Since discharge he is better but still remains weak and wears out easily.  Ankles are still puffy esp in the evening. He says he is off his spironolactone but unclear why-no mention in discharge summary why this was held   Today BMET shows K+ 3.7    ROV 10/24/10 -- COPD, hx psoriatic arthritis and associated ILD, profound hypoxemia and associated Pulm HTN (PAP 57/26 mean 37, PAOP 14/11 mean 7 by cath 10/04/09. 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 Admitted  6/11 - 15, still quite limited, not back to full speed.  Biggest complaint is exertional dyspnea and hypoxemia - to 60's on oximizer 6L/min. He continues to have exertional hypoxemia.   ROV 01/20/11 -- COPD, hx psoriatic arthritis (Dr Tonia Brooms) and associated ILD, profound hypoxemia and associated Pulm HTN (PAP 57/26 mean 37, PAOP 14/11 mean 7 by cath 10/04/09). 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 We had him evaluated for possible transplant, turned down at [facility]. Using 5-6L/min via oximizer at rest, up to 8-10L/min when moving. Using Spiriva and albuterol prn. Continues to have exertional SOB, may be worse. He is coughing more,  has nasal congestion last few days. Taking zyrtec. He is using some saline. Has humidity in his O2 concentrator. Still in the [facility] pulm rehab maintenance program.    ROV 05/18/11 -- COPD, hx psoriatic arthritis (Dr Tonia Brooms) and associated ILD, profound hypoxemia and associated Pulm HTN (PAP 57/26 mean 37, PAOP 14/11 mean 7 by cath 10/04/09). 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 Cough well controlled. He is on Letaris + Tyvaso. He has R heart cath at [facility] in Spring 2012, I don't have data. Needs 6 minute walk.      Objective:   Physical Exam Gen: Pleasant, hyperpigmented face, no distress  ENT: No lesions,  mouth clear,  oropharynx clear, no postnasal drip, throat clearing  Neck: No JVD, no TMG, no carotid bruits  Lungs: No use of accessory muscles, no dullness to percussion, clear without rales or rhonchi  Cardiovascular: RRR, heart sounds normal, no murmur or gallops, no peripheral edema  Musculoskeletal: No deformities, no cyanosis or clubbing  Neuro: alert, non focal  Skin: Warm, no lesions or rashes, hyperpigmented face psoriatic patches along arms and legs    Assessment & Plan:    PULMONARY HYPERTENSION, SECONDARY Appears to be fairly stable although slowly losing some functional capacity on high flow oxygen, Letaris, Tyvaso.  - We will need to consider a transition to IV therapy in the future, possibly Flolan - Continue same medications for now - I will obtain his right heart cath information from [place] - He will perform a 6 minute walk at his followup visit in 4 months - Good discussion today regarding goals of care and CODE STATUS. He wants all appropriate medical therapy but does not want to undergo CPR or to be mechanically ventilated. I will fill out no CODE BLUE orders today  C O P D - continue same inhaled regimen  PSORIASIS With psoriatic arthritis - On prednisone and Humira

## 2011-05-26 ENCOUNTER — Telehealth: Payer: Self-pay | Admitting: Emergency Medicine

## 2011-05-26 ENCOUNTER — Other Ambulatory Visit (INDEPENDENT_AMBULATORY_CARE_PROVIDER_SITE_OTHER): Payer: BC Managed Care – PPO

## 2011-05-26 DIAGNOSIS — I2789 Other specified pulmonary heart diseases: Secondary | ICD-10-CM

## 2011-05-26 LAB — HEPATIC FUNCTION PANEL
Alkaline Phosphatase: 43 U/L (ref 39–117)
Bilirubin, Direct: 0.1 mg/dL (ref 0.0–0.3)
Total Bilirubin: 1 mg/dL (ref 0.3–1.2)
Total Protein: 7.1 g/dL (ref 6.0–8.3)

## 2011-05-26 NOTE — Telephone Encounter (Signed)
Cardiology report placed in RB look at.

## 2011-06-01 NOTE — Telephone Encounter (Signed)
Thank yo will review

## 2011-06-12 ENCOUNTER — Encounter: Payer: Self-pay | Admitting: Emergency Medicine

## 2011-06-22 ENCOUNTER — Telehealth: Payer: Self-pay | Admitting: Emergency Medicine

## 2011-06-22 DIAGNOSIS — I2789 Other specified pulmonary heart diseases: Secondary | ICD-10-CM

## 2011-06-22 NOTE — Telephone Encounter (Signed)
Pt returned call. Kathleen W Perdue °

## 2011-06-22 NOTE — Telephone Encounter (Signed)
ATC pt.  Line rang multiple times.  No answer and then it hung up.  WCB later.

## 2011-06-22 NOTE — Telephone Encounter (Signed)
Pt aware that RB is not back on office until Tuesday morning; will need RB to give okay to place order.

## 2011-06-22 NOTE — Telephone Encounter (Signed)
Order entered

## 2011-06-23 NOTE — Telephone Encounter (Signed)
Called, spoke with pt.  I informed him order was [facility]ed [facility] per orders.  Per pt, he will need this sent to [facility] and states he already has a spot available but just needs orders sent.  Advised I will have [facility] resend information to [facility] and to please call back if anything further is needed.  He verbalized understanding and nothing further needed at this time.  P[facility], will you please fax info to [facility]?  It looks like order was faxed to [place].  Thanks!

## 2011-06-23 NOTE — Telephone Encounter (Signed)
Changed his pul rehab to [facility]

## 2011-06-29 ENCOUNTER — Encounter (HOSPITAL_COMMUNITY)
Admission: RE | Admit: 2011-06-29 | Discharge: 2011-06-29 | Disposition: A | Discharge status: Home / Self Care | Payer: BC Managed Care – PPO | Source: Ambulatory Visit | Attending: Emergency Medicine | Admitting: Emergency Medicine

## 2011-06-29 DIAGNOSIS — J4489 Other specified chronic obstructive pulmonary disease: Secondary | ICD-10-CM | POA: Insufficient documentation

## 2011-06-29 DIAGNOSIS — J449 Chronic obstructive pulmonary disease, unspecified: Secondary | ICD-10-CM | POA: Insufficient documentation

## 2011-07-03 ENCOUNTER — Encounter (HOSPITAL_COMMUNITY)
Admission: RE | Admit: 2011-07-03 | Discharge: 2011-07-03 | Disposition: A | Discharge status: Home / Self Care | Payer: BC Managed Care – PPO | Source: Ambulatory Visit | Attending: Emergency Medicine | Admitting: Emergency Medicine

## 2011-07-03 DIAGNOSIS — I2721 Secondary pulmonary arterial hypertension: Secondary | ICD-10-CM

## 2011-07-06 ENCOUNTER — Encounter (HOSPITAL_COMMUNITY): Payer: BC Managed Care – PPO

## 2011-07-10 ENCOUNTER — Encounter (HOSPITAL_COMMUNITY)
Admission: RE | Admit: 2011-07-10 | Discharge: 2011-07-10 | Disposition: A | Discharge status: Home / Self Care | Payer: BC Managed Care – PPO | Source: Ambulatory Visit | Attending: Emergency Medicine | Admitting: Emergency Medicine

## 2011-07-13 ENCOUNTER — Encounter (HOSPITAL_COMMUNITY)
Admission: RE | Admit: 2011-07-13 | Discharge: 2011-07-13 | Disposition: A | Discharge status: Home / Self Care | Payer: BC Managed Care – PPO | Source: Ambulatory Visit | Attending: Emergency Medicine | Admitting: Emergency Medicine

## 2011-07-15 ENCOUNTER — Other Ambulatory Visit: Payer: Self-pay | Admitting: *Deleted

## 2011-07-15 MED ORDER — AMBRISENTAN 5 MG PO TABS: 5.0000 mg | ORAL_TABLET | Freq: Every day | ORAL | Status: DC

## 2011-07-15 NOTE — Telephone Encounter (Signed)
Prescription refills for Letairis called to [place].

## 2011-07-16 IMAGING — CR DG CHEST 1V PORT
1 series · 1 of 1 positions shown · non-contrast
Comparison: 04/16/2010

CLINICAL DATA: Short of breath

PORTABLE CHEST - 1 VIEW

[view not recorded]
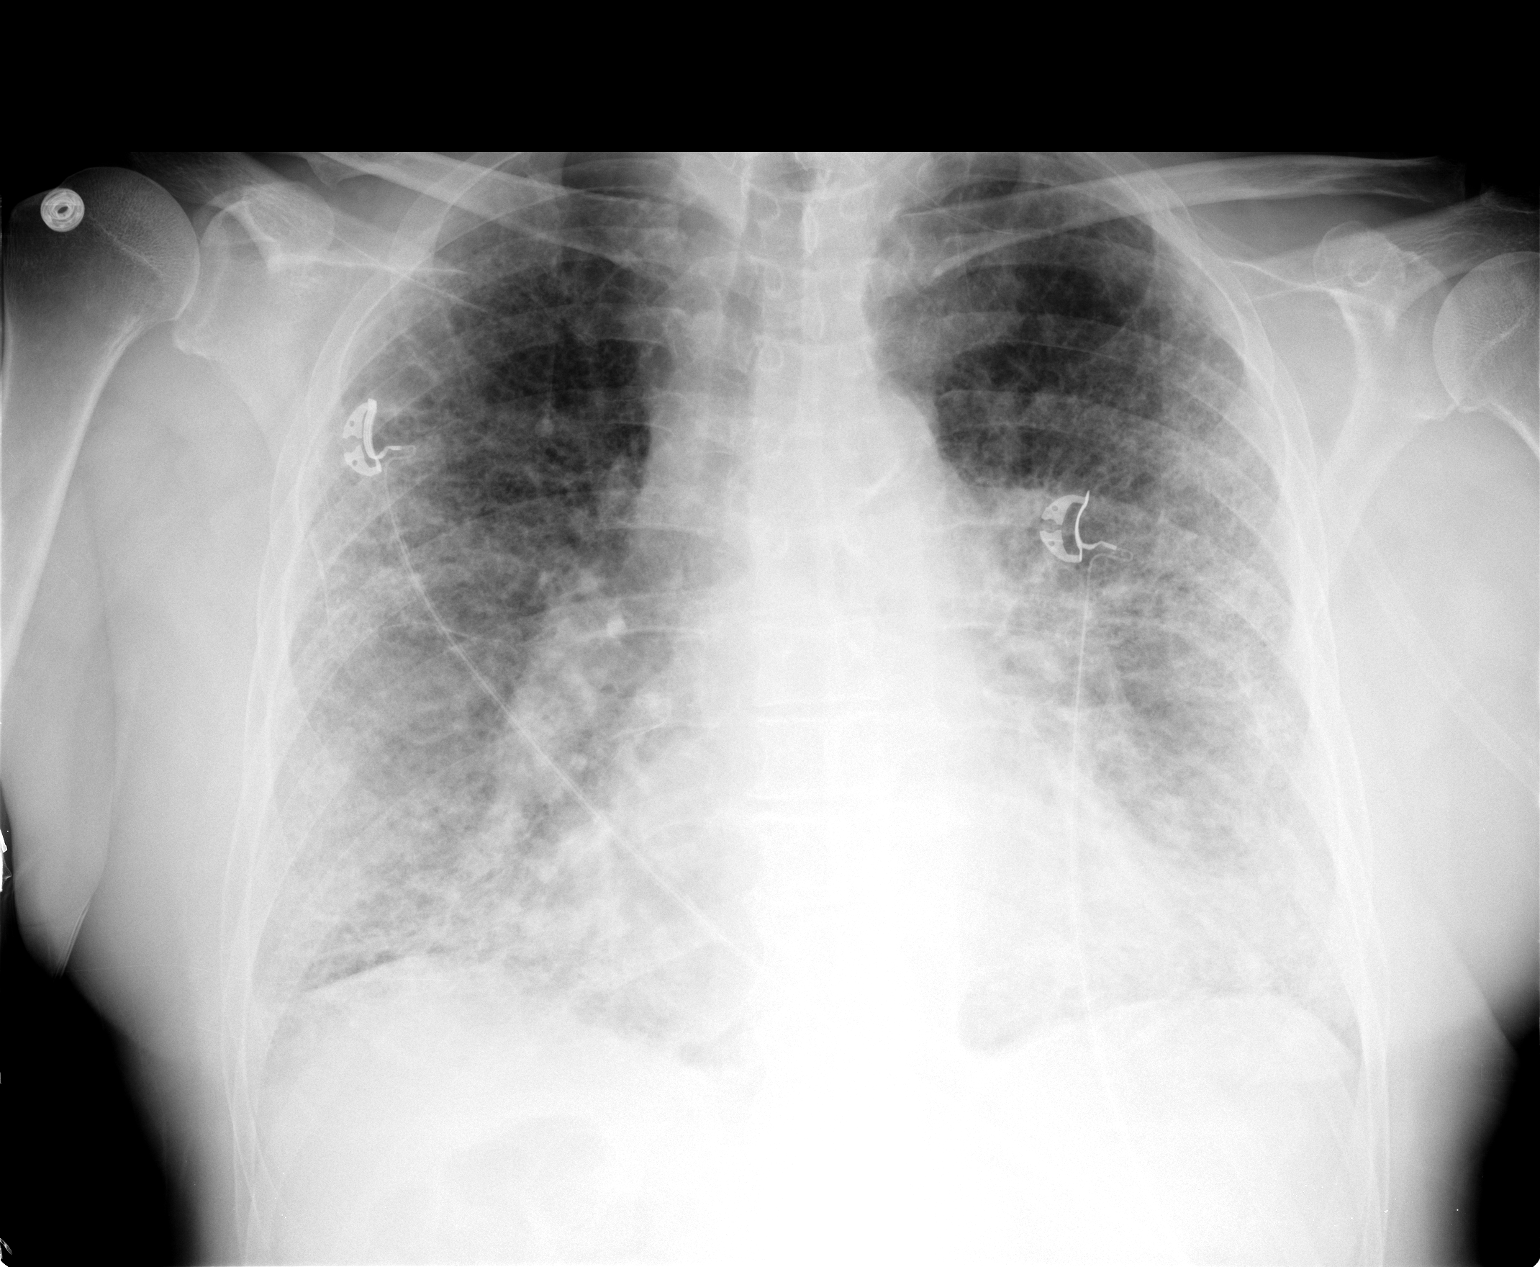

[1 of 1 positions shown; findings below may reference images not displayed]

FINDINGS: Stable cardiomegaly.  Abnormal interstitial markings
consistent with chronic pulmonary fibrosis.  Similar changes noted
on prior studies.  No definite acute findings given lighter
technique. In one-view there is no significant pleural fluid.
IMPRESSION: Cardiomegaly and chronic fibrotic lung disease - no definite acute
findings, considering lighter technique.

## 2011-07-16 IMAGING — CR DG CHEST 1V PORT
1 series · 1 of 1 positions shown · non-contrast
Comparison: 09/29/2010 at 4344 hours

CLINICAL DATA: Respiratory failure

PORTABLE CHEST - 1 VIEW

[AP]
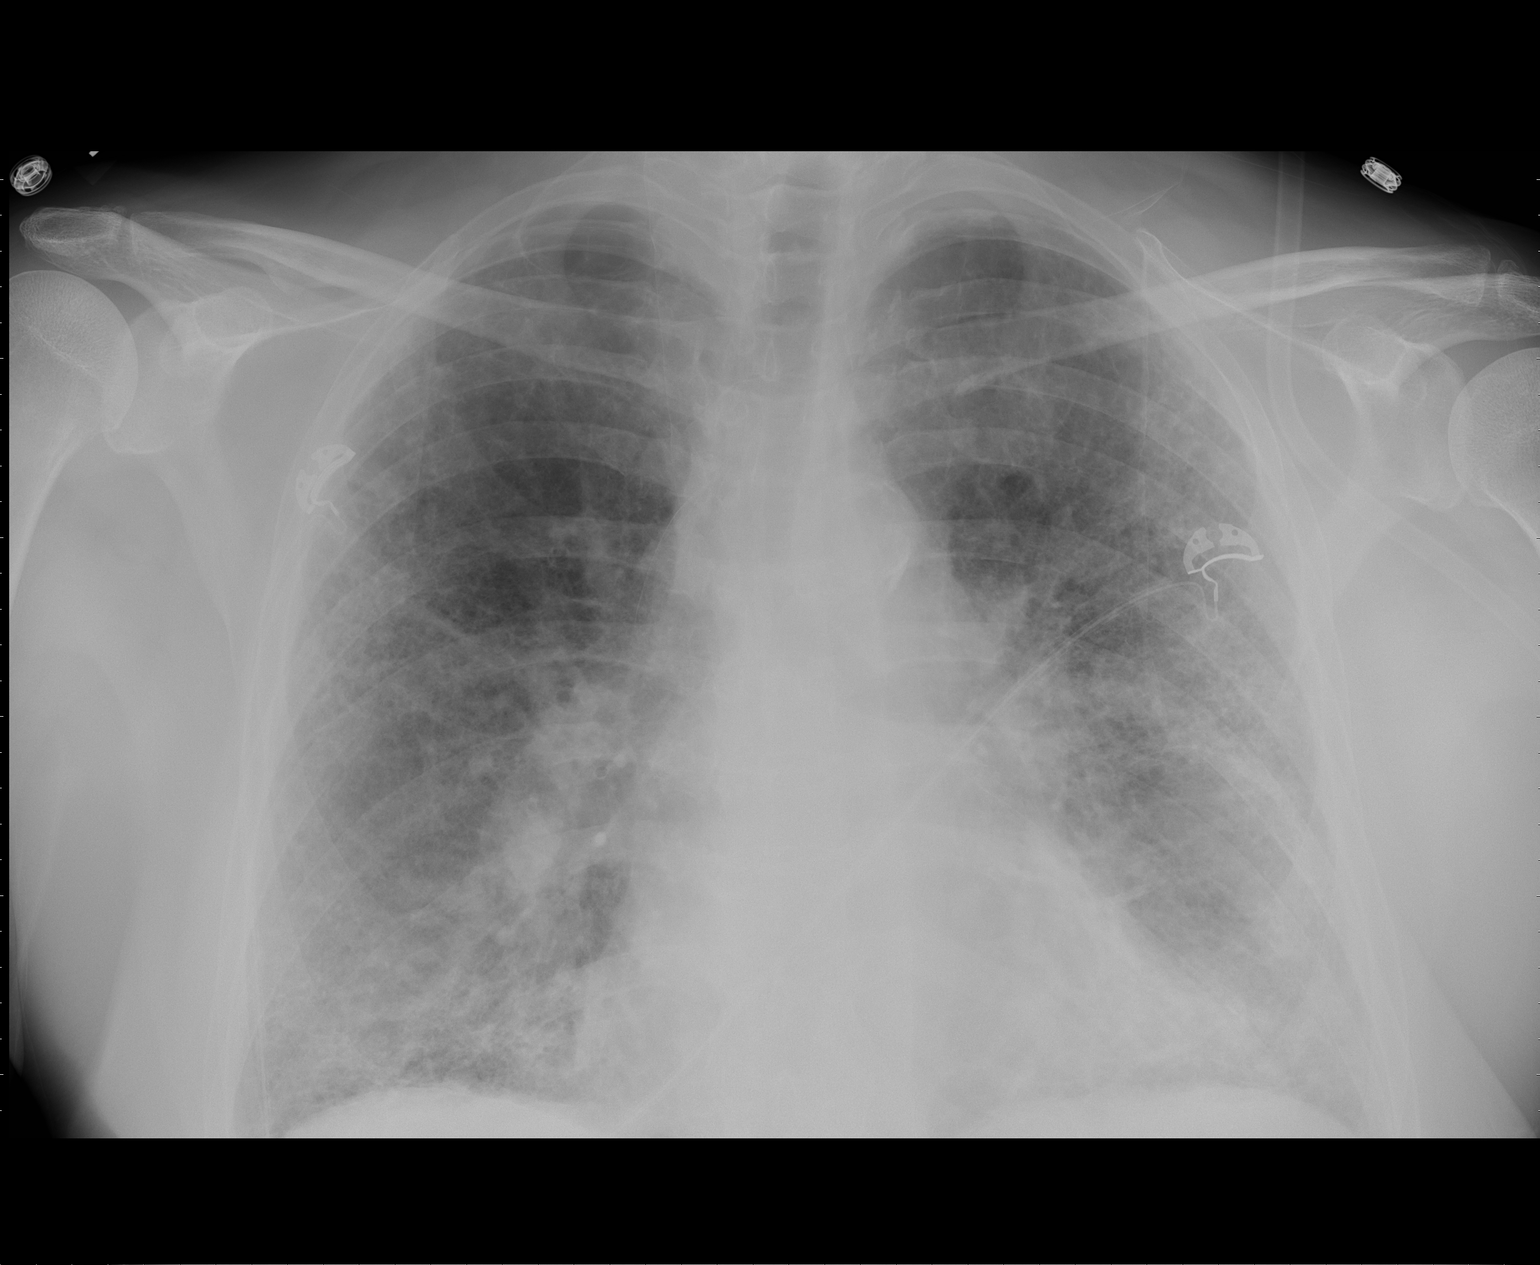

[1 of 1 positions shown; findings below may reference images not displayed]

FINDINGS: Heart is slightly smaller and there has been slight
interval clearing of the central lung zones suggesting that there
may have been a component of congestive heart failure superimposed
upon chronic interstitial disease.  There are no new findings.
IMPRESSION: Interval decrease in heart size and slight improved aeration of the
central lung zones.  Question improving congestive heart
failure/pulmonary edema.

## 2011-07-17 ENCOUNTER — Encounter (HOSPITAL_COMMUNITY)
Admission: RE | Admit: 2011-07-17 | Discharge: 2011-07-17 | Disposition: A | Discharge status: Home / Self Care | Payer: BC Managed Care – PPO | Source: Ambulatory Visit | Attending: Emergency Medicine | Admitting: Emergency Medicine

## 2011-07-17 IMAGING — CR DG CHEST 1V PORT
1 series · 1 of 1 positions shown · non-contrast
Comparison: 09/29/2010.

CLINICAL DATA: Respiratory failure.  Hypoxia.

PORTABLE CHEST - 1 VIEW

[AP]
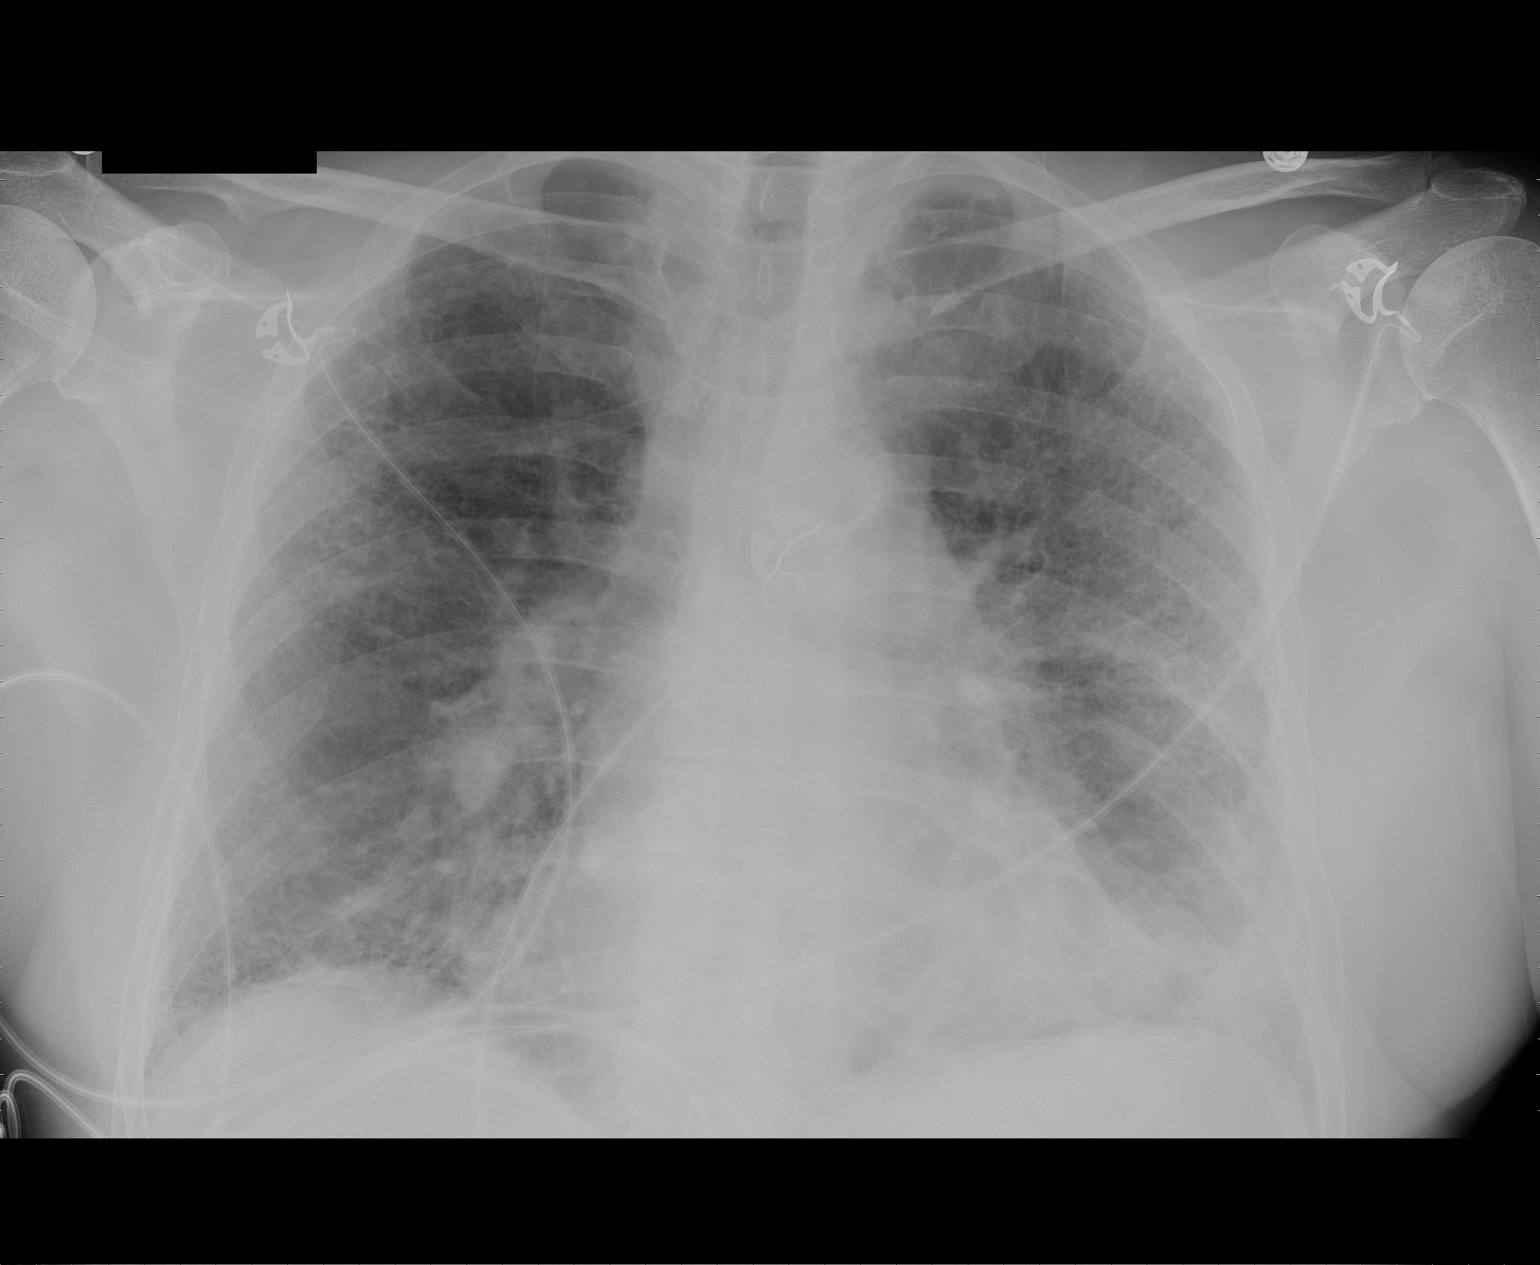

[1 of 1 positions shown; findings below may reference images not displayed]

FINDINGS: Underlying pulmonary fibrosis with superimposed airspace
disease is present.  Cardiomegaly.  Monitoring leads are projected
over the chest.  Compared yesterday's exam, no interval change.
IMPRESSION: Pulmonary fibrosis with superimposed mild airspace disease without
interval change compared to prior.

## 2011-07-20 ENCOUNTER — Encounter (HOSPITAL_COMMUNITY): Payer: BC Managed Care – PPO

## 2011-07-24 ENCOUNTER — Encounter (HOSPITAL_COMMUNITY)
Admission: RE | Admit: 2011-07-24 | Discharge: 2011-07-24 | Disposition: A | Discharge status: Home / Self Care | Payer: BC Managed Care – PPO | Source: Ambulatory Visit | Attending: Emergency Medicine | Admitting: Emergency Medicine

## 2011-07-24 DIAGNOSIS — J449 Chronic obstructive pulmonary disease, unspecified: Secondary | ICD-10-CM | POA: Insufficient documentation

## 2011-07-24 DIAGNOSIS — J4489 Other specified chronic obstructive pulmonary disease: Secondary | ICD-10-CM | POA: Insufficient documentation

## 2011-07-27 ENCOUNTER — Encounter (HOSPITAL_COMMUNITY)
Admission: RE | Admit: 2011-07-27 | Discharge: 2011-07-27 | Disposition: A | Discharge status: Home / Self Care | Payer: BC Managed Care – PPO | Source: Ambulatory Visit | Attending: Emergency Medicine | Admitting: Emergency Medicine

## 2011-07-31 ENCOUNTER — Encounter (HOSPITAL_COMMUNITY)
Admission: RE | Admit: 2011-07-31 | Discharge: 2011-07-31 | Disposition: A | Discharge status: Home / Self Care | Payer: BC Managed Care – PPO | Source: Ambulatory Visit | Attending: Emergency Medicine | Admitting: Emergency Medicine

## 2011-08-03 ENCOUNTER — Encounter (HOSPITAL_COMMUNITY): Payer: BC Managed Care – PPO

## 2011-08-07 ENCOUNTER — Encounter (HOSPITAL_COMMUNITY)
Admission: RE | Admit: 2011-08-07 | Discharge: 2011-08-07 | Disposition: A | Discharge status: Home / Self Care | Payer: BC Managed Care – PPO | Source: Ambulatory Visit | Attending: Emergency Medicine | Admitting: Emergency Medicine

## 2011-08-10 ENCOUNTER — Encounter (HOSPITAL_COMMUNITY)
Admission: RE | Admit: 2011-08-10 | Discharge: 2011-08-10 | Disposition: A | Discharge status: Home / Self Care | Payer: BC Managed Care – PPO | Source: Ambulatory Visit | Attending: Emergency Medicine | Admitting: Emergency Medicine

## 2011-08-14 ENCOUNTER — Encounter (HOSPITAL_COMMUNITY): Payer: BC Managed Care – PPO

## 2011-08-17 ENCOUNTER — Encounter (HOSPITAL_COMMUNITY)
Admission: RE | Admit: 2011-08-17 | Discharge: 2011-08-17 | Disposition: A | Discharge status: Home / Self Care | Payer: BC Managed Care – PPO | Source: Ambulatory Visit | Attending: Emergency Medicine | Admitting: Emergency Medicine

## 2011-08-18 ENCOUNTER — Other Ambulatory Visit: Payer: Self-pay | Admitting: Emergency Medicine

## 2011-08-20 ENCOUNTER — Ambulatory Visit: Payer: BC Managed Care – PPO | Admitting: Emergency Medicine

## 2011-08-20 ENCOUNTER — Ambulatory Visit (INDEPENDENT_AMBULATORY_CARE_PROVIDER_SITE_OTHER): Payer: BC Managed Care – PPO | Admitting: Emergency Medicine

## 2011-08-20 ENCOUNTER — Encounter: Payer: Self-pay | Admitting: Emergency Medicine

## 2011-08-20 VITALS — BP 138/78 | HR 77 | Temp 98.6°F | Ht 68.0 in | Wt 198.6 lb

## 2011-08-20 DIAGNOSIS — J449 Chronic obstructive pulmonary disease, unspecified: Secondary | ICD-10-CM

## 2011-08-20 DIAGNOSIS — R0989 Other specified symptoms and signs involving the circulatory and respiratory systems: Secondary | ICD-10-CM

## 2011-08-20 DIAGNOSIS — J4489 Other specified chronic obstructive pulmonary disease: Secondary | ICD-10-CM

## 2011-08-20 DIAGNOSIS — R0609 Other forms of dyspnea: Secondary | ICD-10-CM

## 2011-08-20 DIAGNOSIS — J9610 Chronic respiratory failure, unspecified whether with hypoxia or hypercapnia: Secondary | ICD-10-CM

## 2011-08-20 DIAGNOSIS — L408 Other psoriasis: Secondary | ICD-10-CM

## 2011-08-20 DIAGNOSIS — I2789 Other specified pulmonary heart diseases: Secondary | ICD-10-CM

## 2011-08-20 NOTE — Assessment & Plan Note (Signed)
On pred + humera

## 2011-08-20 NOTE — Assessment & Plan Note (Addendum)
More dyspnea, shorter 6 minute walk distance. His hypoxemia has seemed to be more significant than his PAH based on TTE and R heart caths. Will repeat TTE in August, plan to refer to [facility] for possible IV therapy.  Continue the Tyvaso and letaris for now.  Pt wants to stop spironolactone, continue lasix. I am willing to do this and then follow BMP next time.

## 2011-08-20 NOTE — Patient Instructions (Signed)
Please continue your Tyvaso and letaris as you are taking them Wear your oxygen at all times Stop spironolactone Continue your same dose of lasix We will perform an echocardiogram in August. Depending on the results we will consider referral to [facility] to consider IV medication for pulmonary hypertension.  Follow with Dr Lamonte Sakai in 1 month for lab work

## 2011-08-20 NOTE — Progress Notes (Signed)
Subjective:    Patient ID: [name], male    DOB: 26-Feb-1951, 61 y.o.   MRN: [id number] HPI 61 yo man, former smoker with COPD, hx psoriatic arthritis and associated ILD, profound hypoxemia and associated Pulm HTN (PAP 57/26 mean 37, PAOP 14/11 mean 7 by cath 10/04/09. 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 Admitted 6/21-27 with resp failure and decompensated R heart failure. He was diuresed, treated with corticosteroids and started on bronchodilators for his COPD. O2 was titrated to 5L/min at rest, he is leaving it on 5 with exertion.   ROV 02/05/10 -- 60yo, PAH with Hx psoriatic arthritis, ILD and R heart failure, hypoxemia, on chronic pred 61m once daily. Also hx COPD.  On letaris since September '11. Since last time breathing may be a bit better. He has good days and bad days. Rarely uses ventolin. No HA, no syncope or near syncope. LE edema has been well controlled and stable. Was desaturated while walking today on presentation. Has been gaining some wt, aldactone was decreased by Dr SElisabeth Caraa month ago. PFTs done today.   ROV 04/16/10 -- Hx as above, follows up after TTE and 6 min walk. Returns feeling a bit more SOB w exertion. 6 min walk distance = 1673massoc with significant desat to 75% (after 2 min). Estimated RVSP 7913m(up from 59 in 09/2009). Has been on letaris since September. Edema seems better on lasix 38m65mo times a day. Clearly both TTE and walk distance are worse.   ROV 05/19/10 -- returns for f/u of the above issues. last time we started Tyvaso in addition to his letaris. Has been on it for a month. Up to 9 puffs 4x a day. Has been assoc with some HA, occas cough right after doing it, not intrusive. On Pred 10mg66miriva + ventoilin as needed. He is not noticing any big changes in his breathing yet on the Tyvaso. Monthly LFT's normal 1/17.   ROV 07/29/10 -- COPD, psoriatic arthritis + ILD, PAH. Regimen is Tyvaso + Letaris. [facility] They have told him he needs screening CSY, EtOH and tobacco random screening, counseling. Tells me today that his breathing is stable, although he has had trouble w allergies and w the pollen. Taking spiriva + SABA. O2 is set on 6L/min via oximizer.   ROV 09/23/10 -- COPD, psoriatic arthritis + ILD, PAH. He is feeling well, has been doing pulm rehab. Has been maintained on Tyvaso, Letaris. [facility] >> cont on current regimen  POST HOSP 10/10/10  Pt returns for post hospital follow up  Pt was admitted   6/11-6/15 for acute on chronic resp failure w/ underlying PF/RA/PAH.  CT chest was neg for PE, chronic changes along with diffuse ASPDZ -?alveolitis vs pulm edema. BNP >2000 . At discharge BNP 548.  He did receive increased steroids and diuresis along pulmonary hygiene.   Since discharge he is better but still remains weak and wears out easily.  Ankles are still puffy esp in the evening. He says he is off his spironolactone but unclear why-no mention in discharge summary why this was held   Today BMET shows K+ 3.7    ROV 10/24/10 -- COPD, hx psoriatic arthritis and associated ILD, profound hypoxemia and associated Pulm HTN (PAP 57/26 mean 37, PAOP 14/11 mean 7 by cath 10/04/09. 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 Admitted  6/11 - 15, still quite limited, not back to full speed.  Biggest complaint is exertional dyspnea and hypoxemia - to 60's on oximizer 6L/min. He continues to have exertional hypoxemia.   ROV 01/20/11 -- COPD, hx psoriatic arthritis (Dr Tonia Brooms) and associated ILD, profound hypoxemia and associated Pulm HTN (PAP 57/26 mean 37, PAOP 14/11 mean 7 by cath 10/04/09). 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 We had him evaluated for possible transplant, turned down at [facility]. Using 5-6L/min via oximizer at rest, up to 8-10L/min when moving. Using Spiriva and albuterol prn. Continues to have exertional SOB, may be worse. He is coughing more,  has nasal congestion last few days. Taking zyrtec. He is using some saline. Has humidity in his O2 concentrator. Still in the [facility] pulm rehab maintenance program.    ROV 05/18/11 -- COPD, hx psoriatic arthritis (Dr Tonia Brooms) and associated ILD, profound hypoxemia and associated Pulm HTN (PAP 57/26 mean 37, PAOP 14/11 mean 7 by cath 10/04/09). 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 Cough well controlled. He is on Letaris + Tyvaso. He had R heart cath at [facility] in Spring 2012, I don't have data. Needs 6 minute walk.   ROV 08/20/11 -- COPD, hx psoriatic arthritis (Dr Tonia Brooms) and associated ILD, profound hypoxemia and associated Pulm HTN (PAP 57/26 mean 37, PAOP 14/11 mean 7 by cath 10/04/09). 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  6 min walk distance 165mtoday. 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 Couldn't walk as far today, feels more dyspneic overall. Using 6L/min per oximizer. [facility] + xarelto. On Spiriva + albuterol. Pt wants me to stop his aldactone and increase lasix, doesn't like the aldactone because of gynecomastia.      Objective:   Physical Exam Gen: Pleasant, hyperpigmented face, no distress  ENT: No lesions,  mouth clear,  oropharynx clear, no postnasal drip, throat clearing  Neck: No JVD, no TMG, no carotid bruits  Lungs: No use of accessory muscles, Bibasilar insp crackles. No wheeze  Cardiovascular: RRR, heart sounds normal, no murmur or gallops, no peripheral edema  Musculoskeletal: No deformities, no cyanosis or clubbing  Neuro: alert, non focal  Skin: Warm, no lesions or rashes, hyperpigmented face psoriatic patches along arms and legs    Assessment & Plan:    PULMONARY HYPERTENSION, SECONDARY More dyspnea, shorter 6 minute walk distance. His hypoxemia has seemed to be more significant than his PAH based on TTE and R heart caths. Will repeat TTE in August, [facility]  Continue the Tyvaso and letaris for now.  Pt wants to stop spironolactone, continue lasix. I am willing to do this and then follow BMP next time.   PSORIASIS On pred + humera  C O P D spiriva + SABA prn

## 2011-08-20 NOTE — Assessment & Plan Note (Signed)
spiriva + SABA prn 

## 2011-08-21 ENCOUNTER — Encounter (HOSPITAL_COMMUNITY)
Admission: RE | Admit: 2011-08-21 | Discharge: 2011-08-21 | Disposition: A | Discharge status: Home / Self Care | Payer: BC Managed Care – PPO | Source: Ambulatory Visit | Attending: Emergency Medicine | Admitting: Emergency Medicine

## 2011-08-21 DIAGNOSIS — J449 Chronic obstructive pulmonary disease, unspecified: Secondary | ICD-10-CM | POA: Insufficient documentation

## 2011-08-21 DIAGNOSIS — J4489 Other specified chronic obstructive pulmonary disease: Secondary | ICD-10-CM | POA: Insufficient documentation

## 2011-08-24 ENCOUNTER — Encounter (HOSPITAL_COMMUNITY)
Admission: RE | Admit: 2011-08-24 | Discharge: 2011-08-24 | Disposition: A | Discharge status: Home / Self Care | Payer: BC Managed Care – PPO | Source: Ambulatory Visit | Attending: Emergency Medicine | Admitting: Emergency Medicine

## 2011-08-28 ENCOUNTER — Telehealth: Payer: Self-pay | Admitting: Emergency Medicine

## 2011-08-28 ENCOUNTER — Encounter (HOSPITAL_COMMUNITY): Payer: BC Managed Care – PPO

## 2011-08-28 NOTE — Telephone Encounter (Signed)
Called, spoke with [name] with [place].  She has 2 questions for Dr. Lamonte Sakai:  1.  Pt is currently taking Tyvaso 9 breaths qid.  Would like to know if pt's dose can be increased to 12 breaths qid.  If so, they would go up 1 breath per week. 2.  Would like to know if RB has thought about adding Revatio to the letaris and tyvaso.    Dr. Lamonte Sakai, pls advise.  Thank you.

## 2011-08-31 ENCOUNTER — Encounter (HOSPITAL_COMMUNITY): Payer: BC Managed Care – PPO

## 2011-09-01 NOTE — Telephone Encounter (Signed)
Max recommended dose for Tyvaso is 9 puffs qid. I can and will speak to him and to [facility] about POSSIBLY increasing. I can also discuss revatio with him at next OV

## 2011-09-01 NOTE — Telephone Encounter (Signed)
Dr Lamonte Sakai please advise, thanks.

## 2011-09-01 NOTE — Telephone Encounter (Signed)
LMTCB

## 2011-09-02 NOTE — Telephone Encounter (Signed)
Gerald Stabs returned call.  Advised of RB's recs.  Gerald Stabs stated that someone from our office called her back on 5.10.13 and authorized for pt's tyvaso to be increased to 12 puffs qid - she did not have a name documented and there is no documentation in our system of this.  Gerald Stabs stated she advised pt to increase his puffs by 1 puff each week up to the 12 puffs qid.  Pt is currently taking 10 puffs qid and will increase to 11 on 5.17.13 or 5.18.13 and is tolerating this.  Bronson Curb that since [place] mentioned increasing pt's dosing, to leave pt doing the increase at this time and if this changes we will call her back.  Also advised Gerald Stabs that pt's next appt with RB is 6.17.13.  Will forward to [place] and page him via [email or web address] for his recs on this.

## 2011-09-02 NOTE — Telephone Encounter (Signed)
LMOM TCB x1 for [place].  Called spoke with patient, apologized for the miscommunication and informed him that [place] does NOT want him to increase his tyvaso - stay at 10 puffs 4x daily for now.  Pt verbalized his understanding and will call our office if he has any changes in his breathing.  Again advised pt to hold his tyvaso at 10 puffs 4x daily and NOT to increase it unless he hears directly from our office.  Pt verbalized his understanding.  Will hold msg in triage d/t to the nature of the call and print copy for TD to follow up on the safety investigation.

## 2011-09-02 NOTE — Telephone Encounter (Signed)
I never called them or asked anyone to be called to increase the dose of Tyvaso. It is not clear to me that there is any benefit to be had or whether it is safe to do so. Please call the pt and ask him to stay on 10 puffs 4x a day and NOT to titrate up. Then I want to know what their/our documentation is regarding who authorized the med to be increased.  This needs to be a safety investigation to see where the mistake/miscommunication happened. I do NOT want his dosing changed until I can evaluate whether there are studies to support the efficacy higher dose Tyvaso.

## 2011-09-02 NOTE — Telephone Encounter (Signed)
[name] returned call, advised that RB did not authorize increase in pt's tyvaso and that he needs to hold at 10 puffs 4x daily and NOT to titrate up.  [name] apologized for not writing down the name of the person who authorized this and asked "who else could have called?  Is there another nurse that would have done that?"  I advised that no, our documentation does not support the claim that someone called her to increase the tyvaso.  Advised that I have already spoken to pt and told him not to increase his dosing any further and to hold at the 10 puffs 4x daily.  [name] verbalized her understanding.  Will sign off and print this revised message for [employer].

## 2011-09-04 ENCOUNTER — Encounter (HOSPITAL_COMMUNITY): Payer: BC Managed Care – PPO

## 2011-09-07 ENCOUNTER — Encounter (HOSPITAL_COMMUNITY)
Admission: RE | Admit: 2011-09-07 | Discharge: 2011-09-07 | Disposition: A | Discharge status: Home / Self Care | Payer: BC Managed Care – PPO | Source: Ambulatory Visit | Attending: Emergency Medicine | Admitting: Emergency Medicine

## 2011-09-11 ENCOUNTER — Encounter (HOSPITAL_COMMUNITY): Payer: BC Managed Care – PPO

## 2011-09-14 ENCOUNTER — Encounter (HOSPITAL_COMMUNITY): Payer: BC Managed Care – PPO

## 2011-09-18 ENCOUNTER — Encounter (HOSPITAL_COMMUNITY): Payer: BC Managed Care – PPO

## 2011-09-18 ENCOUNTER — Encounter (HOSPITAL_COMMUNITY)
Admission: RE | Admit: 2011-09-18 | Discharge: 2011-09-18 | Disposition: A | Discharge status: Home / Self Care | Payer: BC Managed Care – PPO | Source: Ambulatory Visit | Attending: Emergency Medicine | Admitting: Emergency Medicine

## 2011-09-21 ENCOUNTER — Encounter (HOSPITAL_COMMUNITY)
Admission: RE | Admit: 2011-09-21 | Discharge: 2011-09-21 | Disposition: A | Discharge status: Home / Self Care | Payer: BC Managed Care – PPO | Source: Ambulatory Visit | Attending: Emergency Medicine | Admitting: Emergency Medicine

## 2011-09-21 DIAGNOSIS — J4489 Other specified chronic obstructive pulmonary disease: Secondary | ICD-10-CM | POA: Insufficient documentation

## 2011-09-21 DIAGNOSIS — J449 Chronic obstructive pulmonary disease, unspecified: Secondary | ICD-10-CM | POA: Insufficient documentation

## 2011-09-25 ENCOUNTER — Encounter (HOSPITAL_COMMUNITY): Payer: BC Managed Care – PPO

## 2011-09-28 ENCOUNTER — Encounter (HOSPITAL_COMMUNITY): Payer: BC Managed Care – PPO

## 2011-10-02 ENCOUNTER — Encounter (HOSPITAL_COMMUNITY): Payer: BC Managed Care – PPO

## 2011-10-05 ENCOUNTER — Encounter: Payer: Self-pay | Admitting: Emergency Medicine

## 2011-10-05 ENCOUNTER — Other Ambulatory Visit (INDEPENDENT_AMBULATORY_CARE_PROVIDER_SITE_OTHER): Payer: BC Managed Care – PPO

## 2011-10-05 ENCOUNTER — Ambulatory Visit (INDEPENDENT_AMBULATORY_CARE_PROVIDER_SITE_OTHER): Payer: BC Managed Care – PPO | Admitting: Emergency Medicine

## 2011-10-05 ENCOUNTER — Encounter (HOSPITAL_COMMUNITY): Payer: BC Managed Care – PPO

## 2011-10-05 VITALS — BP 142/70 | HR 70 | Temp 98.4°F | Ht 68.0 in | Wt 193.6 lb

## 2011-10-05 DIAGNOSIS — I272 Pulmonary hypertension, unspecified: Secondary | ICD-10-CM

## 2011-10-05 DIAGNOSIS — I2789 Other specified pulmonary heart diseases: Secondary | ICD-10-CM

## 2011-10-05 DIAGNOSIS — R197 Diarrhea, unspecified: Secondary | ICD-10-CM | POA: Insufficient documentation

## 2011-10-05 LAB — HEPATIC FUNCTION PANEL
ALT: 20 U/L (ref 0–53)
AST: 21 U/L (ref 0–37)
Albumin: 3.9 g/dL (ref 3.5–5.2)
Alkaline Phosphatase: 38 U/L — ABNORMAL LOW (ref 39–117)
Bilirubin, Direct: 0.1 mg/dL (ref 0.0–0.3)
Total Protein: 6.7 g/dL (ref 6.0–8.3)

## 2011-10-05 MED ORDER — DIPHENOXYLATE-ATROPINE 2.5-0.025 MG PO TABS: 1.0000 | ORAL_TABLET | Freq: Four times a day (QID) | ORAL | Status: AC | PRN

## 2011-10-05 NOTE — Progress Notes (Signed)
Subjective:    Patient ID: [name], male    DOB: [birth year], 61 y.o.   MRN: [id number] HPI 61 yo man, former smoker with COPD, hx psoriatic arthritis and associated ILD, profound hypoxemia and associated Pulm HTN (PAP 57/26 mean 37, PAOP 14/11 mean 7 by cath 10/04/09. 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 Admitted 6/21-27 with resp failure and decompensated R heart failure. He was diuresed, treated with corticosteroids and started on bronchodilators for his COPD. O2 was titrated to 5L/min at rest, he is leaving it on 5 with exertion.   ROV 02/05/10 -- 60yo, PAH with Hx psoriatic arthritis, ILD and R heart failure, hypoxemia, on chronic pred 80m once daily. Also hx COPD.  On letaris since September '11. Since last time breathing may be a bit better. He has good days and bad days. Rarely uses ventolin. No HA, no syncope or near syncope. LE edema has been well controlled and stable. Was desaturated while walking today on presentation. Has been gaining some wt, aldactone was decreased by Dr SElisabeth Caraa month ago. PFTs done today.   ROV 04/16/10 -- Hx as above, follows up after TTE and 6 min walk. Returns feeling a bit more SOB w exertion. 6 min walk distance = 1681massoc with significant desat to 75% (after 2 min). Estimated RVSP 7927m(up from 59 in 09/2009). Has been on letaris since September. Edema seems better on lasix 17m45mo times a day. Clearly both TTE and walk distance are worse.   ROV 05/19/10 -- returns for f/u of the above issues. last time we started Tyvaso in addition to his letaris. Has been on it for a month. Up to 9 puffs 4x a day. Has been assoc with some HA, occas cough right after doing it, not intrusive. On Pred 10mg43miriva + ventoilin as needed. He is not noticing any big changes in his breathing yet on the Tyvaso. Monthly LFT's normal 1/17.   ROV 07/29/10 -- COPD, psoriatic arthritis + ILD, PAH. Regimen is Tyvaso + Letaris. [facility] They have told him he needs screening CSY, EtOH and tobacco random screening, counseling. Tells me today that his breathing is stable, although he has had trouble w allergies and w the pollen. Taking spiriva + SABA. O2 is set on 6L/min via oximizer.   ROV 09/23/10 -- COPD, psoriatic arthritis + ILD, PAH. He is feeling well, has been doing pulm rehab. Has been maintained on Tyvaso, Letaris. [facility] >> cont on current regimen  POST HOSP 10/10/10  Pt returns for post hospital follow up  Pt was admitted   6/11-6/15 for acute on chronic resp failure w/ underlying PF/RA/PAH.  CT chest was neg for PE, chronic changes along with diffuse ASPDZ -?alveolitis vs pulm edema. BNP >2000 . At discharge BNP 548.  He did receive increased steroids and diuresis along pulmonary hygiene.   Since discharge he is better but still remains weak and wears out easily.  Ankles are still puffy esp in the evening. He says he is off his spironolactone but unclear why-no mention in discharge summary why this was held   Today BMET shows K+ 3.7    ROV 10/24/10 -- COPD, hx psoriatic arthritis and associated ILD, profound hypoxemia and associated Pulm HTN (PAP 57/26 mean 37, PAOP 14/11 mean 7 by cath 10/04/09. 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 Admitted  6/11 - 15, still quite limited, not back to full speed.  Biggest complaint is exertional dyspnea and hypoxemia - to 60's on oximizer 6L/min. He continues to have exertional hypoxemia.   ROV 01/20/11 -- COPD, hx psoriatic arthritis (Dr Tonia Brooms) and associated ILD, profound hypoxemia and associated Pulm HTN (PAP 57/26 mean 37, PAOP 14/11 mean 7 by cath 10/04/09). 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 We had him evaluated for possible transplant, turned down at [facility]. Using 5-6L/min via oximizer at rest, up to 8-10L/min when moving. Using Spiriva and albuterol prn. Continues to have exertional SOB, may be worse. He is coughing more,  has nasal congestion last few days. Taking zyrtec. He is using some saline. Has humidity in his O2 concentrator. Still in the [facility] pulm rehab maintenance program.    ROV 05/18/11 -- COPD, hx psoriatic arthritis (Dr Tonia Brooms) and associated ILD, profound hypoxemia and associated Pulm HTN (PAP 57/26 mean 37, PAOP 14/11 mean 7 by cath 10/04/09). 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 Cough well controlled. He is on Letaris + Tyvaso. He had R heart cath at [facility] in Spring 2012, I don't have data. Needs 6 minute walk.   ROV 08/20/11 -- COPD, hx psoriatic arthritis (Dr Tonia Brooms) and associated ILD, profound hypoxemia and associated Pulm HTN (PAP 57/26 mean 37, PAOP 14/11 mean 7 by cath 10/04/09). 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  6 min walk distance 139mtoday. 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 Couldn't walk as far today, feels more dyspneic overall. Using 6L/min per oximizer. [facility] + xarelto. On Spiriva + albuterol. Pt wants me to stop his aldactone and increase lasix, doesn't like the aldactone because of gynecomastia.   ROV 10/05/11 -- COPD, hx psoriatic arthritis (Dr GTonia Brooms and associated ILD, profound hypoxemia and associated Pulm HTN (PAP 57/26 mean 37, PAOP 14/11 mean 7 by cath 10/04/09). 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 Has spoken with [place] his Tyvaso to 12 puffs qid. I did not do this because I don't know of any studies to support. Still occasionally does the rehab maintenance program. LFT done today.      Objective:   Physical Exam Filed Vitals:   10/05/11 1413  BP: 142/70  Pulse: 70  Temp: 98.4 F (36.9 C)   Gen: Pleasant, hyperpigmented face, no distress  ENT: No lesions,  mouth clear,  oropharynx clear, no postnasal drip, throat clearing  Neck: No JVD, no TMG, no carotid bruits  Lungs: No use of accessory muscles, Bibasilar insp crackles. No  wheeze  Cardiovascular: RRR, heart sounds normal, no murmur or gallops, no peripheral edema  Musculoskeletal: No deformities, no cyanosis or clubbing  Neuro: alert, non focal  Skin: Warm, no lesions or rashes, hyperpigmented face psoriatic patches along arms and legs    Assessment & Plan:    PULMONARY HYPERTENSION, SECONDARY Continue xarelto, letaris.  Trial increasing tyvaso to 12 puffs over the next month, then follow up to assess status, decide whether he needs TTE or 6 monute walk LFT today  Diarrhea Suspect gastroenteritis - will try lomotil prn

## 2011-10-05 NOTE — Assessment & Plan Note (Signed)
Continue xarelto, letaris.  Trial increasing tyvaso to 12 puffs over the next month, then follow up to assess status, decide whether he needs TTE or 6 monute walk LFT today

## 2011-10-05 NOTE — Patient Instructions (Addendum)
Increase your Tyvaso by 1 puff per treatment weekly to goal of 12 puffs per treatment, 4 times a day Continue your xarelto and letaris We will call your with your blood work results Wear your oxygen at all times Follow with dr Lamonte Sakai in 1 month. We will decide at that time when to perform 6 minute walk and echocardiogram.  Use Lomotil as directed for diarrhea. Call our office or Dr Pennie Banter office if this doesn't resolve.

## 2011-10-05 NOTE — Assessment & Plan Note (Signed)
Suspect gastroenteritis - will try lomotil prn

## 2011-10-07 NOTE — Progress Notes (Signed)
Quick Note:  Called, spoke with pt. I informed him LFTs are normal; he should cont with same meds per RB. He verbalized understanding of these results and recs and voiced no further questions/concerns at this time. ______

## 2011-10-09 ENCOUNTER — Encounter (HOSPITAL_COMMUNITY): Payer: BC Managed Care – PPO

## 2011-10-12 ENCOUNTER — Encounter (HOSPITAL_COMMUNITY): Payer: BC Managed Care – PPO

## 2011-10-16 ENCOUNTER — Encounter (HOSPITAL_COMMUNITY): Payer: BC Managed Care – PPO

## 2011-10-19 ENCOUNTER — Encounter (HOSPITAL_COMMUNITY): Payer: BC Managed Care – PPO

## 2011-10-23 ENCOUNTER — Encounter (HOSPITAL_COMMUNITY): Payer: BC Managed Care – PPO

## 2011-10-26 ENCOUNTER — Encounter (HOSPITAL_COMMUNITY): Payer: BC Managed Care – PPO

## 2011-10-30 ENCOUNTER — Encounter (HOSPITAL_COMMUNITY): Payer: BC Managed Care – PPO

## 2011-11-02 ENCOUNTER — Encounter (HOSPITAL_COMMUNITY): Payer: BC Managed Care – PPO

## 2011-11-06 ENCOUNTER — Encounter (HOSPITAL_COMMUNITY): Payer: BC Managed Care – PPO

## 2011-11-12 ENCOUNTER — Telehealth: Payer: Self-pay | Admitting: Emergency Medicine

## 2011-11-12 DIAGNOSIS — I2789 Other specified pulmonary heart diseases: Secondary | ICD-10-CM

## 2011-11-12 NOTE — Telephone Encounter (Signed)
Pt returned [name]'s call. [name]

## 2011-11-12 NOTE — Telephone Encounter (Signed)
He will need to be seen - needs labs (BMP, BNP), may need short term increase in lasix, need to insure that he is adequately oxygenated, no change in other medications. I don't want to increas lasix without knowing his renal fxn - have him come in for BMP and we can arrange for an OV, increase lasix until that appointment. Thanks

## 2011-11-12 NOTE — Telephone Encounter (Signed)
Gerald Stabs stated that we may want to call the pt directly instead of her.  [name]

## 2011-11-12 NOTE — Telephone Encounter (Signed)
This is ok - please check labs and f/u w Sangeeta Youse in the am. I will be out of town and will not be able to check the labs myself.

## 2011-11-12 NOTE — Telephone Encounter (Signed)
Pt is aware and will come by the [facility] on Fri., 11/13/11 for blood draw. Labs were ordered STAT so RB can review and decide if lasix needs to be increased before the weekend. Also pt did start on Colchicine 0.6 mg a day approx 1 week ago for gout (this has been added to pt's med list).. Pt would like to keep OV on 11/24/11 with RB if this is OK. Pls advise.

## 2011-11-12 NOTE — Telephone Encounter (Signed)
Pt called back and said his weight uis only up approx 10 lbs. He is sob all the time and his chest feels tight. There is swelling in his feet and ankles and he does feel sort of bloated all over. Lasix is 40 mg twice a day. Will forward to [place] for recs. Pls advise.

## 2011-11-12 NOTE — Telephone Encounter (Addendum)
Was unable to reach the pt and had to [facility]. I did call to speak with Gerald Stabs at [employer] and she is a Marine scientist that calls to check on [facility] pt's periodically. She spoke with the pt on 10/19/11 and the pt seemed to be doing well. When she spoke with him yesterday he did c/o increased sob, LE edema but felt swollen all over. The pt told the nurse he is up 15 lbs. Pt is taking Lasix 80 mg daily. Pt also up to 12 puffs four times daily on Tyvaso. RB pls advise.No Known Allergies   Pt is already scheduled for follow-up with RB on 11/24/11.

## 2011-11-13 ENCOUNTER — Other Ambulatory Visit (INDEPENDENT_AMBULATORY_CARE_PROVIDER_SITE_OTHER): Payer: BC Managed Care – PPO

## 2011-11-13 DIAGNOSIS — I272 Pulmonary hypertension, unspecified: Secondary | ICD-10-CM

## 2011-11-13 DIAGNOSIS — I2789 Other specified pulmonary heart diseases: Secondary | ICD-10-CM

## 2011-11-13 LAB — HEPATIC FUNCTION PANEL
ALT: 34 U/L (ref 0–53)
Bilirubin, Direct: 0.1 mg/dL (ref 0.0–0.3)
Total Protein: 6.8 g/dL (ref 6.0–8.3)

## 2011-11-13 LAB — BASIC METABOLIC PANEL
BUN: 15 mg/dL (ref 6–23)
Chloride: 102 mEq/L (ref 96–112)
GFR: 81.09 mL/min (ref >60.00)
Potassium: 4 mEq/L (ref 3.5–5.1)
Sodium: 140 mEq/L (ref 135–145)

## 2011-11-13 LAB — BRAIN NATRIURETIC PEPTIDE: Pro B Natriuretic peptide (BNP): 185 pg/mL — ABNORMAL HIGH (ref 0.0–100.0)

## 2011-11-13 NOTE — Telephone Encounter (Signed)
Pt had labs drawn today and per Dr. Lamonte Sakai please ask doctor in the office to review labs and advise the pt on lasix usage before the weekend because he is out of the office. Dr. Halford Chessman, please advise.  Hamilton Bing, CMA

## 2011-11-13 NOTE — Telephone Encounter (Signed)
  Lab Results  Component Value Date   CREATININE 1.0 11/13/2011   BUN 15 11/13/2011   NA 140 11/13/2011   K 4.0 11/13/2011   CL 102 11/13/2011   CO2 27 11/13/2011   Lab Results  Component Value Date   ALT 34 11/13/2011   AST 33 11/13/2011   ALKPHOS 50 11/13/2011   BILITOT 0.9 11/13/2011   Lab Results  Component Value Date   WBC 13.7* 10/02/2010   HGB 12.5* 10/02/2010   HCT 38.2* 10/02/2010   MCV 98.7 10/02/2010   PLT 141* 10/02/2010   BNP    Component Value Date/Time   PROBNP 185.0* 11/13/2011 1454   He was having diarrhea.  His weight was done to 185 lbs.  His breathing was doing well.    His diarrhea resolved.  After this he noticed his weight has increased by 10 lbs.  With this he has more chest tightness and shortness of breath.  He also has more swelling all over.  He tried increasing his oxygen, but this didn't help.  He takes lasix 40 mg bid.  I explained that BNP is not consistent with heart failure, but his symptoms are suggestive of volume overload.  I have advised him to take 80 mg lasix tonight, and bid tomorrow.  He is then to resume lasix 40 mg bid on 7/28.  I have advised him to call next week to arrange for ROV with Dr. Lamonte Sakai.  I have advised him to go to the hospital if his symptoms get worse over the weekend.

## 2011-11-13 NOTE — Telephone Encounter (Signed)
FYI:  Did not see in chart where labs were drawn.  Looks like patient did not come and get them drawn as of now.  Called patients home to inquire, no answer.  Lmtcb.

## 2011-11-15 NOTE — Telephone Encounter (Signed)
Thanks

## 2011-11-24 ENCOUNTER — Ambulatory Visit (INDEPENDENT_AMBULATORY_CARE_PROVIDER_SITE_OTHER): Payer: BC Managed Care – PPO | Admitting: Emergency Medicine

## 2011-11-24 ENCOUNTER — Encounter: Payer: Self-pay | Admitting: Emergency Medicine

## 2011-11-24 VITALS — BP 114/80 | HR 90 | Temp 97.8°F | Ht 68.0 in | Wt 194.0 lb

## 2011-11-24 DIAGNOSIS — J449 Chronic obstructive pulmonary disease, unspecified: Secondary | ICD-10-CM

## 2011-11-24 DIAGNOSIS — L408 Other psoriasis: Secondary | ICD-10-CM

## 2011-11-24 DIAGNOSIS — J4489 Other specified chronic obstructive pulmonary disease: Secondary | ICD-10-CM

## 2011-11-24 DIAGNOSIS — I2789 Other specified pulmonary heart diseases: Secondary | ICD-10-CM

## 2011-11-24 NOTE — Assessment & Plan Note (Signed)
spiriva + SABA prn

## 2011-11-24 NOTE — Assessment & Plan Note (Signed)
-  humira - pred 58m

## 2011-11-24 NOTE — Assessment & Plan Note (Signed)
-  continue letaris 5, consider increase to 10 next time - continue tyvaso 12 puffs 4x a day - continue current o2 regimen - continue xarelto - increase lasix to 41m am/423mpm - recheck BMP next time - 1 month

## 2011-11-24 NOTE — Progress Notes (Signed)
Subjective:    Patient ID: [name], male    DOB: [birth year], 61 y.o.   MRN: [id number] HPI 61 yo man, former smoker with COPD, hx psoriatic arthritis and associated ILD, profound hypoxemia and associated Pulm HTN (PAP 57/26 mean 37, PAOP 14/11 mean 7 by cath 10/04/09. 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 Admitted 6/21-27 with resp failure and decompensated R heart failure. He was diuresed, treated with corticosteroids and started on bronchodilators for his COPD. O2 was titrated to 5L/min at rest, he is leaving it on 5 with exertion.   ROV 02/05/10 -- 61yo, PAH with Hx psoriatic arthritis, ILD and R heart failure, hypoxemia, on chronic pred 80m once daily. Also hx COPD.  On letaris since September '11. Since last time breathing may be a bit better. He has good days and bad days. Rarely uses ventolin. No HA, no syncope or near syncope. LE edema has been well controlled and stable. Was desaturated while walking today on presentation. Has been gaining some wt, aldactone was decreased by Dr SElisabeth Caraa month ago. PFTs done today.   ROV 04/16/10 -- Hx as above, follows up after TTE and 6 min walk. Returns feeling a bit more SOB w exertion. 6 min walk distance = 1681massoc with significant desat to 75% (after 2 min). Estimated RVSP 7927m(up from 59 in 09/2009). Has been on letaris since September. Edema seems better on lasix 17m45mo times a day. Clearly both TTE and walk distance are worse.   ROV 05/19/10 -- returns for f/u of the above issues. last time we started Tyvaso in addition to his letaris. Has been on it for a month. Up to 9 puffs 4x a day. Has been assoc with some HA, occas cough right after doing it, not intrusive. On Pred 10mg43miriva + ventoilin as needed. He is not noticing any big changes in his breathing yet on the Tyvaso. Monthly LFT's normal 1/17.   ROV 07/29/10 -- COPD, psoriatic arthritis + ILD, PAH. Regimen is Tyvaso + Letaris. [facility] They have told him he needs screening CSY, EtOH and tobacco random screening, counseling. Tells me today that his breathing is stable, although he has had trouble w allergies and w the pollen. Taking spiriva + SABA. O2 is set on 6L/min via oximizer.   ROV 09/23/10 -- COPD, psoriatic arthritis + ILD, PAH. He is feeling well, has been doing pulm rehab. Has been maintained on Tyvaso, Letaris. [facility] >> cont on current regimen  POST HOSP 10/10/10  Pt returns for post hospital follow up  Pt was admitted   6/11-6/15 for acute on chronic resp failure w/ underlying PF/RA/PAH.  CT chest was neg for PE, chronic changes along with diffuse ASPDZ -?alveolitis vs pulm edema. BNP >2000 . At discharge BNP 548.  He did receive increased steroids and diuresis along pulmonary hygiene.   Since discharge he is better but still remains weak and wears out easily.  Ankles are still puffy esp in the evening. He says he is off his spironolactone but unclear why-no mention in discharge summary why this was held   Today BMET shows K+ 3.7    ROV 10/24/10 -- COPD, hx psoriatic arthritis and associated ILD, profound hypoxemia and associated Pulm HTN (PAP 57/26 mean 37, PAOP 14/11 mean 7 by cath 10/04/09. 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 Admitted  6/11 - 15, still quite limited, not back to full speed.  Biggest complaint is exertional dyspnea and hypoxemia - to 60's on oximizer 6L/min. He continues to have exertional hypoxemia.   ROV 01/20/11 -- COPD, hx psoriatic arthritis (Dr Tonia Brooms) and associated ILD, profound hypoxemia and associated Pulm HTN (PAP 57/26 mean 37, PAOP 14/11 mean 7 by cath 10/04/09). 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 We had him evaluated for possible transplant, turned down at [facility]. Using 5-6L/min via oximizer at rest, up to 8-10L/min when moving. Using Spiriva and albuterol prn. Continues to have exertional SOB, may be worse. He is coughing more,  has nasal congestion last few days. Taking zyrtec. He is using some saline. Has humidity in his O2 concentrator. Still in the [facility] pulm rehab maintenance program.    ROV 05/18/11 -- COPD, hx psoriatic arthritis (Dr Tonia Brooms) and associated ILD, profound hypoxemia and associated Pulm HTN (PAP 57/26 mean 37, PAOP 14/11 mean 7 by cath 10/04/09). 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 Cough well controlled. He is on Letaris + Tyvaso. He had R heart cath at [facility] in Spring 2012, I don't have data. Needs 6 minute walk.   ROV 08/20/11 -- COPD, hx psoriatic arthritis (Dr Tonia Brooms) and associated ILD, profound hypoxemia and associated Pulm HTN (PAP 57/26 mean 37, PAOP 14/11 mean 7 by cath 10/04/09). 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  6 min walk distance 162mtoday. 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 Couldn't walk as far today, feels more dyspneic overall. Using 6L/min per oximizer. [facility] xarel[facility]. On Spiriva + albuterol. Pt wants me to stop his aldactone and increase lasix, doesn't like the aldactone because of gynecomastia.   ROV 10/05/11 -- COPD, hx psoriatic arthritis (Dr GTonia Brooms and associated ILD, profound hypoxemia and associated Pulm HTN (PAP 57/26 mean 37, PAOP 14/11 mean 7 by cath 10/04/09). 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 Has spoken with CMerauxabout increasing his Tyvaso to 12 puffs qid. I did not do this because I don't know of any studies to support. Still occasionally does the rehab maintenance program. LFT done today.   ROV 11/25/11 -- COPD, hx psoriatic arthritis (Dr GTonia Brooms and associated ILD, profound hypoxemia and associated Pulm HTN (PAP 57/26 mean 37, PAOP 14/11 mean 7 by cath 10/04/09). 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 Tyvaso increased to 12 puffs 4x a day in 6/13 but not sure if it has helped, remains on xarelto and letaris 5. After wt gain of 10 lbs and more SOB, lasix  temporarily increase to 837mbid, then back down to 4067mid. He felt that his breathing was better when he was < 190 lbs, currently at 194lbs.      Objective:   Physical Exam Filed Vitals:   11/24/11 1358  BP: 114/80  Pulse: 90  Temp: 97.8 F (36.6 C)   Gen: Pleasant, hyperpigmented face, no distress  ENT: No lesions,  mouth clear,  oropharynx clear, no postnasal drip, throat clearing  Neck: No JVD, no TMG, no carotid bruits  Lungs: No use of accessory muscles, Bibasilar insp crackles. No wheeze  Cardiovascular: RRR, heart sounds normal, no murmur or gallops, trace pretibial edema  Musculoskeletal: No deformities, no cyanosis or clubbing  Neuro: alert, non focal  Skin: Warm,  no lesions or rashes, hyperpigmented face psoriatic patches along arms and legs    Assessment & Plan:    PULMONARY HYPERTENSION, SECONDARY - continue letaris 5, consider increase to 10 next time - continue tyvaso 12 puffs 4x a day - continue current o2 regimen - continue xarelto - increase lasix to 12m am/440mpm - recheck BMP next time - 1 month  PSORIASIS - humira - pred 1062mC O P D spiriva + SABA prn

## 2011-11-24 NOTE — Patient Instructions (Addendum)
Please continue Letaris, Tyvaso as you are taking them Continue your oxygen as ordered Continue Xarelto Increase your lasix to 24m in the morning, 467min the evening We will recheck your blood work next visit and discuss timing of an echocardiogram and 6 minute walk Continue your prednisone and Spiriva Follow with Dr ByLamonte Sakain 1 month

## 2011-12-25 ENCOUNTER — Encounter: Payer: Self-pay | Admitting: Emergency Medicine

## 2011-12-25 ENCOUNTER — Ambulatory Visit (INDEPENDENT_AMBULATORY_CARE_PROVIDER_SITE_OTHER): Payer: BC Managed Care – PPO | Admitting: Emergency Medicine

## 2011-12-25 VITALS — BP 112/50 | HR 66 | Temp 98.1°F | Ht 68.0 in | Wt 196.8 lb

## 2011-12-25 DIAGNOSIS — I2789 Other specified pulmonary heart diseases: Secondary | ICD-10-CM

## 2011-12-25 DIAGNOSIS — J449 Chronic obstructive pulmonary disease, unspecified: Secondary | ICD-10-CM

## 2011-12-25 DIAGNOSIS — Z23 Encounter for immunization: Secondary | ICD-10-CM

## 2011-12-25 DIAGNOSIS — M069 Rheumatoid arthritis, unspecified: Secondary | ICD-10-CM

## 2011-12-25 NOTE — Progress Notes (Signed)
Subjective:    Patient ID: [name], male    DOB: [birth year], 61 y.o.   MRN: [id number] HPI 61 yo man, former smoker with COPD, hx psoriatic arthritis and associated ILD, profound hypoxemia and associated Pulm HTN (PAP 57/26 mean 37, PAOP 14/11 mean 7 by cath 10/04/09. 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 Admitted 6/21-27 with resp failure and decompensated R heart failure. He was diuresed, treated with corticosteroids and started on bronchodilators for his COPD. O2 was titrated to 5L/min at rest, he is leaving it on 5 with exertion.   ROV 02/05/10 -- 60yo, PAH with Hx psoriatic arthritis, ILD and R heart failure, hypoxemia, on chronic pred 80m once daily. Also hx COPD.  On letaris since September '11. Since last time breathing may be a bit better. He has good days and bad days. Rarely uses ventolin. No HA, no syncope or near syncope. LE edema has been well controlled and stable. Was desaturated while walking today on presentation. Has been gaining some wt, aldactone was decreased by Dr SElisabeth Caraa month ago. PFTs done today.   ROV 04/16/10 -- Hx as above, follows up after TTE and 6 min walk. Returns feeling a bit more SOB w exertion. 6 min walk distance = 1681massoc with significant desat to 75% (after 2 min). Estimated RVSP 7927m(up from 59 in 09/2009). Has been on letaris since September. Edema seems better on lasix 17m45mo times a day. Clearly both TTE and walk distance are worse.   ROV 05/19/10 -- returns for f/u of the above issues. last time we started Tyvaso in addition to his letaris. Has been on it for a month. Up to 9 puffs 4x a day. Has been assoc with some HA, occas cough right after doing it, not intrusive. On Pred 10mg43miriva + ventoilin as needed. He is not noticing any big changes in his breathing yet on the Tyvaso. Monthly LFT's normal 1/17.   ROV 07/29/10 -- COPD, psoriatic arthritis + ILD, PAH. Regimen is Tyvaso + Letaris. [facility] They have told him he needs screening CSY, EtOH and tobacco random screening, counseling. Tells me today that his breathing is stable, although he has had trouble w allergies and w the pollen. Taking spiriva + SABA. O2 is set on 6L/min via oximizer.   ROV 09/23/10 -- COPD, psoriatic arthritis + ILD, PAH. He is feeling well, has been doing pulm rehab. Has been maintained on Tyvaso, Letaris. [facility] >> cont on current regimen  POST HOSP 10/10/10  Pt returns for post hospital follow up  Pt was admitted   6/11-6/15 for acute on chronic resp failure w/ underlying PF/RA/PAH.  CT chest was neg for PE, chronic changes along with diffuse ASPDZ -?alveolitis vs pulm edema. BNP >2000 . At discharge BNP 548.  He did receive increased steroids and diuresis along pulmonary hygiene.   Since discharge he is better but still remains weak and wears out easily.  Ankles are still puffy esp in the evening. He says he is off his spironolactone but unclear why-no mention in discharge summary why this was held   Today BMET shows K+ 3.7    ROV 10/24/10 -- COPD, hx psoriatic arthritis and associated ILD, profound hypoxemia and associated Pulm HTN (PAP 57/26 mean 37, PAOP 14/11 mean 7 by cath 10/04/09. 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 Admitted  6/11 - 15, still quite limited, not back to full speed.  Biggest complaint is exertional dyspnea and hypoxemia - to 60's on oximizer 6L/min. He continues to have exertional hypoxemia.   ROV 01/20/11 -- COPD, hx psoriatic arthritis (Dr Tonia Brooms) and associated ILD, profound hypoxemia and associated Pulm HTN (PAP 57/26 mean 37, PAOP 14/11 mean 7 by cath 10/04/09). 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 We had him evaluated for possible transplant, turned down at [facility]. Using 5-6L/min via oximizer at rest, up to 8-10L/min when moving. Using Spiriva and albuterol prn. Continues to have exertional SOB, may be worse. He is coughing more,  has nasal congestion last few days. Taking zyrtec. He is using some saline. Has humidity in his O2 concentrator. Still in the [facility] pulm rehab maintenance program.    ROV 05/18/11 -- COPD, hx psoriatic arthritis (Dr Tonia Brooms) and associated ILD, profound hypoxemia and associated Pulm HTN (PAP 57/26 mean 37, PAOP 14/11 mean 7 by cath 10/04/09). 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 Cough well controlled. He is on Letaris + Tyvaso. He had R heart cath at [facility] in Spring 2012, I don't have data. Needs 6 minute walk.   ROV 08/20/11 -- COPD, hx psoriatic arthritis (Dr Tonia Brooms) and associated ILD, profound hypoxemia and associated Pulm HTN (PAP 57/26 mean 37, PAOP 14/11 mean 7 by cath 10/04/09). 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  6 min walk distance 148mtoday. 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 Couldn't walk as far today, feels more dyspneic overall. Using 6L/min per oximizer. [facility] On letaris + xarelto. On Spiriva + albuterol. Pt wants me to stop his aldactone and increase lasix, doesn't like the aldactone because of gynecomastia.   ROV 10/05/11 -- COPD, hx psoriatic arthritis (Dr GTonia Brooms and associated ILD, profound hypoxemia and associated Pulm HTN (PAP 57/26 mean 37, PAOP 14/11 mean 7 by cath 10/04/09). 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 Has spoken with [place] increasing his Tyvaso to 12 puffs qid. I did not do this because I don't know of any studies to support. Still occasionally does the rehab maintenance program. LFT done today.   ROV 11/25/11 -- COPD, hx psoriatic arthritis (Dr GTonia Brooms and associated ILD, profound hypoxemia and associated Pulm HTN (PAP 57/26 mean 37, PAOP 14/11 mean 7 by cath 10/04/09). 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 Tyvaso increased to 12 puffs 4x a day in 6/13 but not sure if it has helped, remains on xarelto and letaris 5. After wt gain of 10 lbs and more SOB, lasix  temporarily increase to 823mbid, then back down to 4025mid. He felt that his breathing was better when he was < 190 lbs, currently at 194lbs.   ROV 12/25/11 -- COPD, hx psoriatic arthritis (Dr GruTonia Broomsnd associated ILD, profound hypoxemia and associated Pulm HTN. On Letaris + Tyvaso. He remains hypoxic with exertion, even on oximizer.  Last time we continued 12 puffs tyvaso. He feels stable, able to exert some. Since last time [facility] in coming months. Wants the flu shot.    Objective:   Physical Exam Filed Vitals:   12/25/11 1137  BP: 112/50  Pulse: 66  Temp: 98.1 F (36.7 C)   Gen: Pleasant, hyperpigmented face,  no distress  ENT: No lesions,  mouth clear,  oropharynx clear, no postnasal drip, throat clearing  Neck: No JVD, no TMG, no carotid bruits  Lungs: No use of accessory muscles, Bibasilar insp crackles. No wheeze  Cardiovascular: RRR, heart sounds normal, no murmur or gallops, trace pretibial edema  Musculoskeletal: No deformities, no cyanosis or clubbing  Neuro: alert, non focal  Skin: Warm, no lesions or rashes, hyperpigmented face psoriatic patches along arms and legs    Assessment & Plan:    PULMONARY HYPERTENSION, SECONDARY - continue Tyvaso + letaris + xarelto - follow TTE result from dr harding - O2 per oximizer - 6 minute walk this Fall - diuretics as ordered   ARTHRITIS, RHEUMATOID Continue Humira and pred 10  C O P D Spiriva

## 2011-12-25 NOTE — Patient Instructions (Addendum)
Please continue your oxygen as you are wearing it Continue your medications as you are taking them We will review your echocardiogram from Dr Hardin's office Follow with Dr Lamonte Sakai in 2 months or sooner if you have any problems.

## 2011-12-25 NOTE — Assessment & Plan Note (Signed)
Spiriva

## 2011-12-25 NOTE — Assessment & Plan Note (Signed)
Continue Humira and pred 10

## 2011-12-25 NOTE — Assessment & Plan Note (Addendum)
-  continue Tyvaso + letaris + xarelto - follow TTE result from dr harding - O2 per oximizer - 6 minute walk this Fall - diuretics as ordered

## 2012-01-12 ENCOUNTER — Other Ambulatory Visit: Payer: Self-pay | Admitting: Emergency Medicine

## 2012-02-16 ENCOUNTER — Encounter: Payer: Self-pay | Admitting: Emergency Medicine

## 2012-03-04 ENCOUNTER — Telehealth: Payer: Self-pay | Admitting: Emergency Medicine

## 2012-03-04 MED ORDER — POTASSIUM CHLORIDE CRYS ER 20 MEQ PO TBCR: EXTENDED_RELEASE_TABLET | ORAL | Status: DC

## 2012-03-04 NOTE — Telephone Encounter (Signed)
Called and spoke with pt and he is aware of med sent to the pharmacy.  Nothing further is needed.

## 2012-03-04 NOTE — Telephone Encounter (Signed)
RB---pt is requesting a refill of the klor con---looks like he was taking 2 daily and he is wanting this sent in for 90 day supply.  Please advise if ok to send in refill.  This med is not on his med list at this time.  Thanks   Last ov 12/2011 Next ov--03/11/2012  No Known Allergies

## 2012-03-04 NOTE — Telephone Encounter (Signed)
Yes this is okay

## 2012-03-07 ENCOUNTER — Other Ambulatory Visit (INDEPENDENT_AMBULATORY_CARE_PROVIDER_SITE_OTHER): Payer: BC Managed Care – PPO

## 2012-03-07 DIAGNOSIS — I272 Pulmonary hypertension, unspecified: Secondary | ICD-10-CM

## 2012-03-07 DIAGNOSIS — I2789 Other specified pulmonary heart diseases: Secondary | ICD-10-CM

## 2012-03-07 LAB — HEPATIC FUNCTION PANEL
Albumin: 4.6 g/dL (ref 3.5–5.2)
Total Protein: 7.7 g/dL (ref 6.0–8.3)

## 2012-03-09 NOTE — Progress Notes (Signed)
Quick Note:  Spoke with patient made him aware of results per Dr. Lamonte Sakai as listed below. Patient verbalized understanding of this and nothing further needed at this time. ______

## 2012-03-11 ENCOUNTER — Ambulatory Visit (INDEPENDENT_AMBULATORY_CARE_PROVIDER_SITE_OTHER): Payer: BC Managed Care – PPO | Admitting: Emergency Medicine

## 2012-03-11 ENCOUNTER — Encounter: Payer: Self-pay | Admitting: Emergency Medicine

## 2012-03-11 VITALS — BP 102/72 | HR 66 | Temp 98.1°F | Ht 68.0 in | Wt 198.0 lb

## 2012-03-11 DIAGNOSIS — J8410 Pulmonary fibrosis, unspecified: Secondary | ICD-10-CM

## 2012-03-11 DIAGNOSIS — I2789 Other specified pulmonary heart diseases: Secondary | ICD-10-CM

## 2012-03-11 DIAGNOSIS — J3489 Other specified disorders of nose and nasal sinuses: Secondary | ICD-10-CM | POA: Insufficient documentation

## 2012-03-11 DIAGNOSIS — L408 Other psoriasis: Secondary | ICD-10-CM

## 2012-03-11 NOTE — Assessment & Plan Note (Signed)
No change to current meds

## 2012-03-11 NOTE — Progress Notes (Signed)
Subjective:    Patient ID: [name], male    DOB: 05-Jul-1950, 61 y.o.   MRN: [id number] HPI 61 yo man, former smoker with COPD, hx psoriatic arthritis and associated ILD, profound hypoxemia and associated Pulm HTN (PAP 57/26 mean 37, PAOP 14/11 mean 7 by cath 10/04/09. 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 Admitted 6/21-27 with resp failure and decompensated R heart failure. He was diuresed, treated with corticosteroids and started on bronchodilators for his COPD. O2 was titrated to 5L/min at rest, he is leaving it on 5 with exertion.   ROV 02/05/10 -- 61yo, PAH with Hx psoriatic arthritis, ILD and R heart failure, hypoxemia, on chronic pred 76m once daily. Also hx COPD.  On letaris since September '11. Since last time breathing may be a bit better. He has good days and bad days. Rarely uses ventolin. No HA, no syncope or near syncope. LE edema has been well controlled and stable. Was desaturated while walking today on presentation. Has been gaining some wt, aldactone was decreased by Dr SElisabeth Caraa month ago. PFTs done today.   ROV 04/16/10 -- Hx as above, follows up after TTE and 6 min walk. Returns feeling a bit more SOB w exertion. 6 min walk distance = 1665massoc with significant desat to 75% (after 2 min). Estimated RVSP 7954m(up from 59 in 09/2009). Has been on letaris since September. Edema seems better on lasix 63m35mo times a day. Clearly both TTE and walk distance are worse.   ROV 05/19/10 -- returns for f/u of the above issues. last time we started Tyvaso in addition to his letaris. Has been on it for a month. Up to 9 puffs 4x a day. Has been assoc with some HA, occas cough right after doing it, not intrusive. On Pred 10mg12miriva + ventoilin as needed. He is not noticing any big changes in his breathing yet on the Tyvaso. Monthly LFT's normal 1/17.   ROV 07/29/10 -- COPD, psoriatic arthritis + ILD, PAH. Regimen is Tyvaso + Letaris. [facility] They have told him he needs screening CSY, EtOH and tobacco random screening, counseling. Tells me today that his breathing is stable, although he has had trouble w allergies and w the pollen. Taking spiriva + SABA. O2 is set on 6L/min via oximizer.   ROV 09/23/10 -- COPD, psoriatic arthritis + ILD, PAH. He is feeling well, has been doing pulm rehab. Has been maintained on Tyvaso, Letaris. [facility] >> cont on current regimen  POST HOSP 10/10/10  Pt returns for post hospital follow up  Pt was admitted   6/11-6/15 for acute on chronic resp failure w/ underlying PF/RA/PAH.  CT chest was neg for PE, chronic changes along with diffuse ASPDZ -?alveolitis vs pulm edema. BNP >2000 . At discharge BNP 548.  He did receive increased steroids and diuresis along pulmonary hygiene.   Since discharge he is better but still remains weak and wears out easily.  Ankles are still puffy esp in the evening. He says he is off his spironolactone but unclear why-no mention in discharge summary why this was held   Today BMET shows K+ 3.7    ROV 10/24/10 -- COPD, hx psoriatic arthritis and associated ILD, profound hypoxemia and associated Pulm HTN (PAP 57/26 mean 37, PAOP 14/11 mean 7 by cath 10/04/09. 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 Admitted  6/11 - 15, still quite limited, not back to full speed.  Biggest complaint is exertional dyspnea and hypoxemia - to 60's on oximizer 6L/min. He continues to have exertional hypoxemia.   ROV 01/20/11 -- COPD, hx psoriatic arthritis (Dr Tonia Brooms) and associated ILD, profound hypoxemia and associated Pulm HTN (PAP 57/26 mean 37, PAOP 14/11 mean 7 by cath 10/04/09). 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 We had him evaluated for possible transplant, turned down at [facility]. Using 5-6L/min via oximizer at rest, up to 8-10L/min when moving. Using Spiriva and albuterol prn. Continues to have exertional SOB, may be worse. He is coughing more,  has nasal congestion last few days. Taking zyrtec. He is using some saline. Has humidity in his O2 concentrator. Still in the [facility] pulm rehab maintenance program.    ROV 05/18/11 -- COPD, hx psoriatic arthritis (Dr Tonia Brooms) and associated ILD, profound hypoxemia and associated Pulm HTN (PAP 57/26 mean 37, PAOP 14/11 mean 7 by cath 10/04/09). 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 Cough well controlled. He is on Letaris + Tyvaso. He had R heart cath at [facility] in Spring 2012, I don't have data. Needs 6 minute walk.   ROV 08/20/11 -- COPD, hx psoriatic arthritis (Dr Tonia Brooms) and associated ILD, profound hypoxemia and associated Pulm HTN (PAP 57/26 mean 37, PAOP 14/11 mean 7 by cath 10/04/09). 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  6 min walk distance 146mtoday. 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 Couldn't walk as far today, feels more dyspneic overall. Using 6L/min per oximizer. [facility] + xarelto. On Spiriva + albuterol. Pt wants me to stop his aldactone and increase lasix, doesn't like the aldactone because of gynecomastia.   ROV 10/05/11 -- COPD, hx psoriatic arthritis (Dr GTonia Brooms and associated ILD, profound hypoxemia and associated Pulm HTN (PAP 57/26 mean 37, PAOP 14/11 mean 7 by cath 10/04/09). 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 Has spoken with CAldrichabout increasing his Tyvaso to 12 puffs qid. I did not do this because I don't know of any studies to support. Still occasionally does the rehab maintenance program. LFT done today.   ROV 11/25/11 -- COPD, hx psoriatic arthritis (Dr GTonia Brooms and associated ILD, profound hypoxemia and associated Pulm HTN (PAP 57/26 mean 37, PAOP 14/11 mean 7 by cath 10/04/09). 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 Tyvaso increased to 12 puffs 4x a day in 6/13 but not sure if it has helped, remains on xarelto and letaris 5. After wt gain of 10 lbs and more SOB, lasix  temporarily increase to 856mbid, then back down to 4026mid. He felt that his breathing was better when he was < 190 lbs, currently at 194lbs.   ROV 12/25/11 -- COPD, hx psoriatic arthritis (Dr GruTonia Broomsnd associated ILD, profound hypoxemia and associated Pulm HTN. On Letaris + Tyvaso. He remains hypoxic with exertion, even on oximizer.  Last time we continued 12 puffs tyvaso. He feels stable, able to exert some. Since last time [facility] coming months. Wants the flu shot.   ROV 03/11/12 -- COPD, hx psoriatic arthritis (Dr GruTonia Broomsnd associated ILD, profound hypoxemia and associated Pulm HTN. On Letaris + Tyvaso 12 puffs. He remains hypoxic with exertion, even  on oximizer. Due for 6 minute walk in March. He has been having trouble with nose dryness, bleeding, due to high flow O2. He has tried nasal saline, saline gel, etc. Still very problematic.  His breathing has been fairly stable.   Objective:   Physical Exam Filed Vitals:   03/11/12 1412  BP: 102/72  Pulse: 66  Temp: 98.1 F (36.7 C)   Gen: Pleasant, hyperpigmented face, no distress  ENT: No lesions,  mouth clear,  oropharynx clear, no postnasal drip, throat clearing. Nasal mucosa red and swollen bilaterally  Neck: No JVD, no TMG, no carotid bruits  Lungs: No use of accessory muscles, Bibasilar insp crackles. No wheeze  Cardiovascular: RRR, heart sounds normal, no murmur or gallops, trace pretibial edema  Musculoskeletal: No deformities, no cyanosis or clubbing  Neuro: alert, non focal  Skin: Warm, no lesions or rashes, hyperpigmented face psoriatic patches along arms and legs    Assessment & Plan:  PULMONARY HYPERTENSION, SECONDARY - contineu same regimen - repeat 6 min walk in march - high flow O2 >> will try to make this more tolerable, ? Any advice from ENT  Nasal obstruction Likely due to high flow O2. He has used water-based products to lubricate. Needs ENT eval, recs  regarding any alternatives to saline gel. ? Whether he needs nasal steroids?   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 Any advice from ENT  Nasal obstruction Likely due to high flow O2. He has used water-based products to lubricate. Needs ENT eval, recs regarding any alternatives to saline gel. ? Whether he needs nasal steroids?   PSORIASIS No change to current meds  PULMONARY FIBROSIS Serial CXR and CT scans

## 2012-03-11 NOTE — Patient Instructions (Addendum)
Please continue your current medications We will refer you to ENT to evaluate your nasal obstruction, discuss possible options to help decrease inflammation and improve lubrication Follow with Dr Lamonte Sakai in 3 months or sooner if you have any problems. We will repeat your 6 minute walk in March

## 2012-03-11 NOTE — Addendum Note (Signed)
Addended by: Raymondo Band D on: 03/11/2012 03:34 PM   Modules accepted: Orders

## 2012-03-11 NOTE — Assessment & Plan Note (Signed)
Serial CXR and CT scans

## 2012-03-11 NOTE — Addendum Note (Signed)
Addended by: Horatio Pel on: 03/11/2012 04:09 PM   Modules accepted: Orders

## 2012-03-11 NOTE — Assessment & Plan Note (Signed)
-  contineu same regimen - repeat 6 min walk in march - high flow O2 >> will try to make this more tolerable, ? Any advice from ENT

## 2012-03-11 NOTE — Assessment & Plan Note (Signed)
Likely due to high flow O2. He has used water-based products to lubricate. Needs ENT eval, recs regarding any alternatives to saline gel. ? Whether he needs nasal steroids?

## 2012-03-15 ENCOUNTER — Telehealth: Payer: Self-pay | Admitting: Emergency Medicine

## 2012-03-15 DIAGNOSIS — J449 Chronic obstructive pulmonary disease, unspecified: Secondary | ICD-10-CM

## 2012-03-15 NOTE — Telephone Encounter (Signed)
ATC pt line busy x 3 wcb. i have sent order to [facility].

## 2012-03-15 NOTE — Telephone Encounter (Signed)
LMOM TCB x1 for [name] w/ resp (directed to her VM by referrals w/ [facility]).  Spoke with patient, he has portable liquid O2 that goes up to 6lpm for when he leaves the house and will be doing a lot of sitting (ie sitting  in the car while his wife goes to the store) and the large cylinders that go up to 8lpm that he uses w/ exertion outside of the home.  Pt only wanted this order sent b/c an order was sent incorrectly at 11.22.13 that pt uses 8lpm continuously. ========================== Derek returned call; discussed the above with her.  Derek stated that they had received an order yesterday for 8lpm continuously.  Then an order today for 8lpm w/ exercise only.  Needs the order to be replaced as "use 6L with liquid O2 and 8L with exercise."  Order sent.   Called spoke with patient; he is aware the order has been corrected and sent.  Will sign off.

## 2012-03-15 NOTE — Telephone Encounter (Signed)
Yes this is ok 

## 2012-03-15 NOTE — Telephone Encounter (Signed)
I spoke with pt and he stated he wanted [place] to send [employer] an order stating he is only on 8 liters of oxygen when he is exercising. Please advise RB if okay to do so. thanks

## 2012-03-15 NOTE — Telephone Encounter (Signed)
Tammy from [place] called to state that pt is on liquid O2 which will not accomodate pt on 8L. (She states she just received order for this).

## 2012-04-23 ENCOUNTER — Other Ambulatory Visit: Payer: Self-pay | Admitting: Emergency Medicine

## 2012-04-29 ENCOUNTER — Other Ambulatory Visit: Payer: Self-pay | Admitting: Emergency Medicine

## 2012-05-06 ENCOUNTER — Telehealth: Payer: Self-pay | Admitting: Emergency Medicine

## 2012-05-06 NOTE — Telephone Encounter (Signed)
Called and spoke with pt and he stated that he needs the neb cup and the piece on top---pts stated that his rx ran out for this.  i called and spoke with [employer] and they will get the reordered for him.  Nothing further is needed.

## 2012-05-17 ENCOUNTER — Ambulatory Visit (INDEPENDENT_AMBULATORY_CARE_PROVIDER_SITE_OTHER)
Admission: RE | Admit: 2012-05-17 | Discharge: 2012-05-17 | Disposition: A | Discharge status: Home / Self Care | Payer: BC Managed Care – PPO | Source: Ambulatory Visit | Attending: Emergency Medicine | Admitting: Emergency Medicine

## 2012-05-17 ENCOUNTER — Encounter: Payer: Self-pay | Admitting: Emergency Medicine

## 2012-05-17 ENCOUNTER — Ambulatory Visit (INDEPENDENT_AMBULATORY_CARE_PROVIDER_SITE_OTHER): Payer: BC Managed Care – PPO | Admitting: Emergency Medicine

## 2012-05-17 VITALS — BP 128/80 | HR 111 | Temp 97.5°F | Ht 68.0 in | Wt 200.2 lb

## 2012-05-17 DIAGNOSIS — I2789 Other specified pulmonary heart diseases: Secondary | ICD-10-CM

## 2012-05-17 DIAGNOSIS — J8410 Pulmonary fibrosis, unspecified: Secondary | ICD-10-CM

## 2012-05-17 MED ORDER — PREDNISONE 20 MG PO TABS: 40.0000 mg | ORAL_TABLET | Freq: Every day | ORAL | Status: DC

## 2012-05-17 NOTE — Progress Notes (Signed)
Subjective:    Patient ID: [name], male    DOB: 05-Jul-1950, 62 y.o.   MRN: [id number] HPI 62 yo man, former smoker with COPD, hx psoriatic arthritis and associated ILD, profound hypoxemia and associated Pulm HTN (PAP 57/26 mean 37, PAOP 14/11 mean 7 by cath 10/04/09. 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 Admitted 6/21-27 with resp failure and decompensated R heart failure. He was diuresed, treated with corticosteroids and started on bronchodilators for his COPD. O2 was titrated to 5L/min at rest, he is leaving it on 5 with exertion.   ROV 02/05/10 -- 62yo, PAH with Hx psoriatic arthritis, ILD and R heart failure, hypoxemia, on chronic pred 76m once daily. Also hx COPD.  On letaris since September '11. Since last time breathing may be a bit better. He has good days and bad days. Rarely uses ventolin. No HA, no syncope or near syncope. LE edema has been well controlled and stable. Was desaturated while walking today on presentation. Has been gaining some wt, aldactone was decreased by Dr SElisabeth Caraa month ago. PFTs done today.   ROV 04/16/10 -- Hx as above, follows up after TTE and 6 min walk. Returns feeling a bit more SOB w exertion. 6 min walk distance = 1665massoc with significant desat to 75% (after 2 min). Estimated RVSP 7954m(up from 59 in 09/2009). Has been on letaris since September. Edema seems better on lasix 63m35mo times a day. Clearly both TTE and walk distance are worse.   ROV 05/19/10 -- returns for f/u of the above issues. last time we started Tyvaso in addition to his letaris. Has been on it for a month. Up to 9 puffs 4x a day. Has been assoc with some HA, occas cough right after doing it, not intrusive. On Pred 10mg12miriva + ventoilin as needed. He is not noticing any big changes in his breathing yet on the Tyvaso. Monthly LFT's normal 1/17.   ROV 07/29/10 -- COPD, psoriatic arthritis + ILD, PAH. Regimen is Tyvaso + Letaris. [facility]. They have told him he needs screening CSY, EtOH and tobacco random screening, counseling. Tells me today that his breathing is stable, although he has had trouble w allergies and w the pollen. Taking spiriva + SABA. O2 is set on 6L/min via oximizer.   ROV 09/23/10 -- COPD, psoriatic arthritis + ILD, PAH. He is feeling well, has been doing pulm rehab. Has been maintained on Tyvaso, Letaris. [facility]. >> cont on current regimen  POST HOSP 10/10/10  Pt returns for post hospital follow up  Pt was admitted   6/11-6/15 for acute on chronic resp failure w/ underlying PF/RA/PAH.  CT chest was neg for PE, chronic changes along with diffuse ASPDZ -?alveolitis vs pulm edema. BNP >2000 . At discharge BNP 548.  He did receive increased steroids and diuresis along pulmonary hygiene.   Since discharge he is better but still remains weak and wears out easily.  Ankles are still puffy esp in the evening. He says he is off his spironolactone but unclear why-no mention in discharge summary why this was held   Today BMET shows K+ 3.7    ROV 10/24/10 -- COPD, hx psoriatic arthritis and associated ILD, profound hypoxemia and associated Pulm HTN (PAP 57/26 mean 37, PAOP 14/11 mean 7 by cath 10/04/09. 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 Admitted  6/11 - 15, still quite limited, not back to full speed.  Biggest complaint is exertional dyspnea and hypoxemia - to 60's on oximizer 6L/min. He continues to have exertional hypoxemia.   ROV 01/20/11 -- COPD, hx psoriatic arthritis (Dr Tonia Brooms) and associated ILD, profound hypoxemia and associated Pulm HTN (PAP 57/26 mean 37, PAOP 14/11 mean 7 by cath 10/04/09). 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 We had him evaluated for possible transplant, turned down at [facility]. Using 5-6L/min via oximizer at rest, up to 8-10L/min when moving. Using Spiriva and albuterol prn. Continues to have exertional SOB, may be worse. He is coughing more,  has nasal congestion last few days. Taking zyrtec. He is using some saline. Has humidity in his O2 concentrator. Still in the [facility] pulm rehab maintenance program.    ROV 05/18/11 -- COPD, hx psoriatic arthritis (Dr Tonia Brooms) and associated ILD, profound hypoxemia and associated Pulm HTN (PAP 57/26 mean 37, PAOP 14/11 mean 7 by cath 10/04/09). 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 Cough well controlled. He is on Letaris + Tyvaso. He had R heart cath at [facility] in Spring 2012, I don't have data. Needs 6 minute walk.   ROV 08/20/11 -- COPD, hx psoriatic arthritis (Dr Tonia Brooms) and associated ILD, profound hypoxemia and associated Pulm HTN (PAP 57/26 mean 37, PAOP 14/11 mean 7 by cath 10/04/09). 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  6 min walk distance 146mtoday. 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 Couldn't walk as far today, feels more dyspneic overall. Using 6L/min per oximizer. [facility] + xarelto. On Spiriva + albuterol. Pt wants me to stop his aldactone and increase lasix, doesn't like the aldactone because of gynecomastia.   ROV 10/05/11 -- COPD, hx psoriatic arthritis (Dr GTonia Brooms and associated ILD, profound hypoxemia and associated Pulm HTN (PAP 57/26 mean 37, PAOP 14/11 mean 7 by cath 10/04/09). 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 Has spoken with CAldrichabout increasing his Tyvaso to 12 puffs qid. I did not do this because I don't know of any studies to support. Still occasionally does the rehab maintenance program. LFT done today.   ROV 11/25/11 -- COPD, hx psoriatic arthritis (Dr GTonia Brooms and associated ILD, profound hypoxemia and associated Pulm HTN (PAP 57/26 mean 37, PAOP 14/11 mean 7 by cath 10/04/09). 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 Tyvaso increased to 12 puffs 4x a day in 6/13 but not sure if it has helped, remains on xarelto and letaris 5. After wt gain of 10 lbs and more SOB, lasix  temporarily increase to 856mbid, then back down to 4026mid. He felt that his breathing was better when he was < 190 lbs, currently at 194lbs.   ROV 12/25/11 -- COPD, hx psoriatic arthritis (Dr GruTonia Broomsnd associated ILD, profound hypoxemia and associated Pulm HTN. On Letaris + Tyvaso. He remains hypoxic with exertion, even on oximizer.  Last time we continued 12 puffs tyvaso. He feels stable, able to exert some. Since last time he [facility] in coming months. Wants the flu shot.   ROV 03/11/12 -- COPD, hx psoriatic arthritis (Dr GruTonia Broomsnd associated ILD, profound hypoxemia and associated Pulm HTN. On Letaris + Tyvaso 12 puffs. He remains hypoxic with exertion, even  on oximizer. Due for 6 minute walk in March. He has been having trouble with nose dryness, bleeding, due to high flow O2. He has tried nasal saline, saline gel, etc. Still very problematic.  His breathing has been fairly stable.   ROV 05/17/12 -- COPD, hx psoriatic arthritis (Dr Tonia Brooms) and associated ILD, profound hypoxemia and associated Pulm HTN. On Letaris + Tyvaso 12 puffs. Has been very difficult to keep him oxygenated, he is having progressive dyspnea. Has been seen at [facility] for transplant but not for [facility]. He has more SOB, now unable to walk up the stairs - has started sleeping downstairs/. He uses albuterol tid (an increase) since last month.     Objective:   Physical Exam Filed Vitals:   05/17/12 1159  BP: 128/80  Pulse: 111  Temp: 97.5 F (36.4 C)   Gen: Pleasant, hyperpigmented face, no distress  ENT: No lesions,  mouth clear,  oropharynx clear, no postnasal drip, throat clearing. Nasal mucosa red and swollen bilaterally  Neck: No JVD, no TMG, no carotid bruits  Lungs: No use of accessory muscles, Bibasilar insp crackles. No wheeze  Cardiovascular: RRR, heart sounds normal, no murmur or gallops, trace pretibial edema  Musculoskeletal: No deformities, no cyanosis or  clubbing  Neuro: alert, non focal  Skin: Warm, no lesions or rashes, hyperpigmented face psoriatic patches along arms and legs    Assessment & Plan:  PULMONARY FIBROSIS Suspect that his progression is due to his PAH, but must also consider flare or progression of his ILD; the latter would be easier to treat more aggressively. Crackles on exam today with little change from priors.  - cxr today - trial pred 48m x 1 month then wean back down  PULMONARY HYPERTENSION, SECONDARY Discussed course with him. He is profoundly hypoxic and suspect that this related to progressive PAH. He is on letaris and max tyvaso.  - I asked him to discuss with his wife possible IV therapy; not clear to me how much benefit he would get. I do believe it is worth going to [facility] to discuss starting therapy. We will revisit, or they will call me to discuss

## 2012-05-17 NOTE — Assessment & Plan Note (Signed)
Suspect that his progression is due to his PAH, but must also consider flare or progression of his ILD; the latter would be easier to treat more aggressively. Crackles on exam today with little change from priors.  - cxr today - trial pred 57m x 1 month then wean back down

## 2012-05-17 NOTE — Assessment & Plan Note (Signed)
Discussed course with him. He is profoundly hypoxic and suspect that this related to progressive PAH. He is on letaris and max tyvaso.  - I asked him to discuss with his wife possible IV therapy; not clear to me how much benefit he would get. I do believe it is worth going to [facility] to discuss starting therapy. We will revisit, or they will call me to discuss

## 2012-05-17 NOTE — Patient Instructions (Addendum)
Continue your inhaled medications and oxygen as you are using them We will increase prednisone to 76m daily for at least a month CXR today We will need to consider possible referral to [facility] to consider IV therapy for pulmonary hypertension. Please call our office if you decide you want [place] make that referral before our next office visit Follow with Dr BLamonte Sakaiin 1 month

## 2012-06-08 ENCOUNTER — Telehealth: Payer: Self-pay | Admitting: Emergency Medicine

## 2012-06-08 MED ORDER — FUROSEMIDE 40 MG PO TABS: ORAL_TABLET | ORAL | Status: DC

## 2012-06-08 NOTE — Telephone Encounter (Signed)
Pt aware rx has been sent. Nothing further was needed 

## 2012-06-10 ENCOUNTER — Other Ambulatory Visit: Payer: Self-pay | Admitting: Emergency Medicine

## 2012-06-20 ENCOUNTER — Ambulatory Visit: Payer: BC Managed Care – PPO | Admitting: Emergency Medicine

## 2012-06-21 ENCOUNTER — Other Ambulatory Visit: Payer: Self-pay | Admitting: Emergency Medicine

## 2012-06-21 MED ORDER — ALBUTEROL SULFATE (2.5 MG/3ML) 0.083% IN NEBU: 2.5000 mg | INHALATION_SOLUTION | Freq: Four times a day (QID) | RESPIRATORY_TRACT | Status: AC | PRN

## 2012-06-22 ENCOUNTER — Ambulatory Visit (INDEPENDENT_AMBULATORY_CARE_PROVIDER_SITE_OTHER): Payer: BC Managed Care – PPO | Admitting: Emergency Medicine

## 2012-06-22 ENCOUNTER — Encounter: Payer: Self-pay | Admitting: Emergency Medicine

## 2012-06-22 ENCOUNTER — Other Ambulatory Visit (INDEPENDENT_AMBULATORY_CARE_PROVIDER_SITE_OTHER): Payer: BC Managed Care – PPO

## 2012-06-22 VITALS — BP 122/70 | HR 84 | Temp 97.2°F | Ht 68.0 in | Wt 203.6 lb

## 2012-06-22 DIAGNOSIS — I272 Pulmonary hypertension, unspecified: Secondary | ICD-10-CM

## 2012-06-22 DIAGNOSIS — J449 Chronic obstructive pulmonary disease, unspecified: Secondary | ICD-10-CM

## 2012-06-22 DIAGNOSIS — I2789 Other specified pulmonary heart diseases: Secondary | ICD-10-CM

## 2012-06-22 DIAGNOSIS — M069 Rheumatoid arthritis, unspecified: Secondary | ICD-10-CM

## 2012-06-22 LAB — HEPATIC FUNCTION PANEL
AST: 34 U/L (ref 0–37)
Albumin: 3.8 g/dL (ref 3.5–5.2)
Alkaline Phosphatase: 52 U/L (ref 39–117)
Bilirubin, Direct: 0.2 mg/dL (ref 0.0–0.3)

## 2012-06-22 NOTE — Progress Notes (Signed)
Subjective:    Patient ID: [name], male    DOB: 05-Jul-1950, 62 y.o.   MRN: [id number] HPI 62 yo man, former smoker with COPD, hx psoriatic arthritis and associated ILD, profound hypoxemia and associated Pulm HTN (PAP 57/26 mean 37, PAOP 14/11 mean 7 by cath 10/04/09. 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 Admitted 6/21-27 with resp failure and decompensated R heart failure. He was diuresed, treated with corticosteroids and started on bronchodilators for his COPD. O2 was titrated to 5L/min at rest, he is leaving it on 5 with exertion.   ROV 02/05/10 -- 62yo, PAH with Hx psoriatic arthritis, ILD and R heart failure, hypoxemia, on chronic pred 76m once daily. Also hx COPD.  On letaris since September '11. Since last time breathing may be a bit better. He has good days and bad days. Rarely uses ventolin. No HA, no syncope or near syncope. LE edema has been well controlled and stable. Was desaturated while walking today on presentation. Has been gaining some wt, aldactone was decreased by Dr SElisabeth Caraa month ago. PFTs done today.   ROV 04/16/10 -- Hx as above, follows up after TTE and 6 min walk. Returns feeling a bit more SOB w exertion. 6 min walk distance = 1665[place] with significant desat to 75% (after 2 min). Estimated RVSP 7954m(up from 59 in 09/2009). Has been on letaris since September. Edema seems better on lasix 63m35mo times a day. Clearly both TTE and walk distance are worse.   ROV 05/19/10 -- returns for f/u of the above issues. last time we started Tyvaso in addition to his letaris. Has been on it for a month. Up to 9 puffs 4x a day. Has been assoc with some HA, occas cough right after doing it, not intrusive. On Pred 10mg12miriva + ventoilin as needed. He is not noticing any big changes in his breathing yet on the Tyvaso. Monthly LFT's normal 1/17.   ROV 07/29/10 -- COPD, psoriatic arthritis + ILD, PAH. Regimen is Tyvaso + Letaris. [facility] They have told him he needs screening CSY, EtOH and tobacco random screening, counseling. Tells me today that his breathing is stable, although he has had trouble w allergies and w the pollen. Taking spiriva + SABA. O2 is set on 6L/min via oximizer.   ROV 09/23/10 -- COPD, psoriatic arthritis + ILD, PAH. He is feeling well, has been doing pulm rehab. Has been maintained on Tyvaso, Letaris. [facility] >> cont on current regimen  POST HOSP 10/10/10  Pt returns for post hospital follow up  Pt was admitted   6/11-6/15 for acute on chronic resp failure w/ underlying PF/RA/PAH.  CT chest was neg for PE, chronic changes along with diffuse ASPDZ -?alveolitis vs pulm edema. BNP >2000 . At discharge BNP 548.  He did receive increased steroids and diuresis along pulmonary hygiene.   Since discharge he is better but still remains weak and wears out easily.  Ankles are still puffy esp in the evening. He says he is off his spironolactone but unclear why-no mention in discharge summary why this was held   Today BMET shows K+ 3.7    ROV 10/24/10 -- COPD, hx psoriatic arthritis and associated ILD, profound hypoxemia and associated Pulm HTN (PAP 57/26 mean 37, PAOP 14/11 mean 7 by cath 10/04/09. 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 Admitted  6/11 - 15, still quite limited, not back to full speed.  Biggest complaint is exertional dyspnea and hypoxemia - to 60's on oximizer 6L/min. He continues to have exertional hypoxemia.   ROV 01/20/11 -- COPD, hx psoriatic arthritis (Dr Tonia Brooms) and associated ILD, profound hypoxemia and associated Pulm HTN (PAP 57/26 mean 37, PAOP 14/11 mean 7 by cath 10/04/09). 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 We had him evaluated for possible transplant, turned down at [facility]. Using 5-6L/min via oximizer at rest, up to 8-10L/min when moving. Using Spiriva and albuterol prn. Continues to have exertional SOB, may be worse. He is coughing more,  has nasal congestion last few days. Taking zyrtec. He is using some saline. Has humidity in his O2 concentrator. Still in the [facility] pulm rehab maintenance program.    ROV 05/18/11 -- COPD, hx psoriatic arthritis (Dr Tonia Brooms) and associated ILD, profound hypoxemia and associated Pulm HTN (PAP 57/26 mean 37, PAOP 14/11 mean 7 by cath 10/04/09). 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 Cough well controlled. He is on Letaris + Tyvaso. He had R heart cath at [facility] in Spring 2012, I don't have data. Needs 6 minute walk.   ROV 08/20/11 -- COPD, hx psoriatic arthritis (Dr Tonia Brooms) and associated ILD, profound hypoxemia and associated Pulm HTN (PAP 57/26 mean 37, PAOP 14/11 mean 7 by cath 10/04/09). 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  6 min walk distance 146mtoday. 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 Couldn't walk as far today, feels more dyspneic overall. Using 6L/min per oximizer. [facility]. On Spiriva + albuterol. Pt wants me to stop his aldactone and increase lasix, doesn't like the aldactone because of gynecomastia.   ROV 10/05/11 -- COPD, hx psoriatic arthritis (Dr GTonia Brooms and associated ILD, profound hypoxemia and associated Pulm HTN (PAP 57/26 mean 37, PAOP 14/11 mean 7 by cath 10/04/09). 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 Has spoken with CAldrichabout increasing his Tyvaso to 12 puffs qid. I did not do this because I don't know of any studies to support. Still occasionally does the rehab maintenance program. LFT done today.   ROV 11/25/11 -- COPD, hx psoriatic arthritis (Dr GTonia Brooms and associated ILD, profound hypoxemia and associated Pulm HTN (PAP 57/26 mean 37, PAOP 14/11 mean 7 by cath 10/04/09). 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 Tyvaso increased to 12 puffs 4x a day in 6/13 but not sure if it has helped, remains on xarelto and letaris 5. After wt gain of 10 lbs and more SOB, lasix  temporarily increase to 856mbid, then back down to 4026mid. He felt that his breathing was better when he was < 190 lbs, currently at 194lbs.   ROV 12/25/11 -- COPD, hx psoriatic arthritis (Dr GruTonia Broomsnd associated ILD, profound hypoxemia and associated Pulm HTN. On Letaris + Tyvaso. He remains hypoxic with exertion, even on oximizer.  Last time we continued 12 puffs tyvaso. He feels stable, able to exert some. Since last time [facility] in coming months. Wants the flu shot.   ROV 03/11/12 -- COPD, hx psoriatic arthritis (Dr GruTonia Broomsnd associated ILD, profound hypoxemia and associated Pulm HTN. On Letaris + Tyvaso 12 puffs. He remains hypoxic with exertion, even  on oximizer. Due for 6 minute walk in March. He has been having trouble with nose dryness, bleeding, due to high flow O2. He has tried nasal saline, saline gel, etc. Still very problematic.  His breathing has been fairly stable.   ROV 05/17/12 -- COPD, hx psoriatic arthritis (Dr Tonia Brooms) and associated ILD, profound hypoxemia and associated Pulm HTN. On Letaris + Tyvaso 12 puffs. Has been very difficult to keep him oxygenated, he is having progressive dyspnea. Has been seen at [facility] for transplant but not for [place]-Let[place]. He has more SOB, now unable to walk up the stairs - has started sleeping downstairs/. He uses albuterol tid (an increase) since last month.   ROV 06/22/12 -- COPD, hx psoriatic arthritis (Dr Tonia Brooms) and associated ILD, profound hypoxemia and associated Pulm HTN. On Letaris + Tyvaso 12 puffs.  He is profoundly hypoxemic and I suspect he needs IV therapy. He is still having problems with epistaxis, has seen ENT - due to his high flow O2. Last time we increased pred to 52m empirically - he may be recovering more quickly after exertion. Last TTE PASP was 50-60, 12/23/11.    Objective:   Physical Exam Filed Vitals:   06/22/12 1540  BP: 122/70  Pulse: 84  Temp: 97.2 F (36.2 C)   Gen:  Pleasant, hyperpigmented face, no distress  ENT: No lesions,  mouth clear,  oropharynx clear, no postnasal drip, throat clearing. Nasal mucosa red and swollen bilaterally  Neck: No JVD, no TMG, no carotid bruits  Lungs: No use of accessory muscles, Bibasilar insp crackles. No wheeze  Cardiovascular: RRR, heart sounds normal, no murmur or gallops, trace pretibial edema  Musculoskeletal: No deformities, no cyanosis or clubbing  Neuro: alert, non focal  Skin: Warm, no lesions or rashes, hyperpigmented face psoriatic patches along arms and legs    Assessment & Plan:  PULMONARY HYPERTENSION, SECONDARY - continue same regimen - repeat TTE - [facility] wants to see the TTE results first - rov 6 weeks  ARTHRITIS, RHEUMATOID - wean pred back to 173mdaily  C O P D - bd's as ordered

## 2012-06-22 NOTE — Patient Instructions (Addendum)
Please decrease for prednisone to 45m for 1 week, then 282mfor 1 week, then stay on 102maily.  Continue your current oxygen Continue tyvaso and letaris We will perform a repeat echocardiogram with Dr HarEllyn Hackllow with Dr ByrLamonte Sakai 6 weeks or sooner if you have any problems

## 2012-06-22 NOTE — Assessment & Plan Note (Signed)
-  bd's as ordered

## 2012-06-22 NOTE — Assessment & Plan Note (Signed)
-  continue same regimen - repeat TTE - he isn't sure about going to [facility] for IV therapy, wants to see the TTE results first - rov 6 weeks

## 2012-06-22 NOTE — Assessment & Plan Note (Signed)
-  wean pred back to 52m daily

## 2012-06-23 ENCOUNTER — Other Ambulatory Visit (HOSPITAL_COMMUNITY): Payer: Self-pay | Admitting: Emergency Medicine

## 2012-06-23 NOTE — Progress Notes (Signed)
Quick Note:  Spoke with patient, patient aware of results as listed below per RB. Patient verbalized understanding and nothing further needed at this time. ______

## 2012-06-28 ENCOUNTER — Ambulatory Visit (HOSPITAL_COMMUNITY)
Admission: RE | Admit: 2012-06-28 | Discharge: 2012-06-28 | Disposition: A | Discharge status: Home / Self Care | Payer: BC Managed Care – PPO | Source: Ambulatory Visit | Attending: Cardiology | Admitting: Cardiology

## 2012-06-28 DIAGNOSIS — I2789 Other specified pulmonary heart diseases: Secondary | ICD-10-CM | POA: Insufficient documentation

## 2012-06-28 NOTE — Progress Notes (Signed)
[facility]   2D echo completed 06/28/2012.   Jamison Neighbor, RDCS

## 2012-06-30 ENCOUNTER — Telehealth: Payer: Self-pay | Admitting: Emergency Medicine

## 2012-06-30 MED ORDER — TIOTROPIUM BROMIDE MONOHYDRATE 18 MCG IN CAPS: 18.0000 ug | ORAL_CAPSULE | Freq: Every day | RESPIRATORY_TRACT | Status: DC

## 2012-06-30 NOTE — Telephone Encounter (Signed)
Pt requesting 90 day supply  spiriva rx sent nothing further needed

## 2012-07-18 ENCOUNTER — Inpatient Hospital Stay (HOSPITAL_COMMUNITY)
Admission: EM | Admit: 2012-07-18 | Discharge: 2012-07-27 | DRG: 541 | Disposition: A | Discharge status: Home Health | Payer: BC Managed Care – PPO | Attending: Emergency Medicine | Admitting: Emergency Medicine

## 2012-07-18 ENCOUNTER — Encounter (HOSPITAL_COMMUNITY): Payer: Self-pay | Admitting: Emergency Medicine

## 2012-07-18 ENCOUNTER — Emergency Department (HOSPITAL_COMMUNITY): Payer: BC Managed Care – PPO

## 2012-07-18 DIAGNOSIS — G931 Anoxic brain damage, not elsewhere classified: Secondary | ICD-10-CM | POA: Diagnosis not present

## 2012-07-18 DIAGNOSIS — D751 Secondary polycythemia: Secondary | ICD-10-CM | POA: Diagnosis present

## 2012-07-18 DIAGNOSIS — Z87891 Personal history of nicotine dependence: Secondary | ICD-10-CM

## 2012-07-18 DIAGNOSIS — T380X5A Adverse effect of glucocorticoids and synthetic analogues, initial encounter: Secondary | ICD-10-CM | POA: Diagnosis not present

## 2012-07-18 DIAGNOSIS — I739 Peripheral vascular disease, unspecified: Secondary | ICD-10-CM

## 2012-07-18 DIAGNOSIS — I279 Pulmonary heart disease, unspecified: Secondary | ICD-10-CM | POA: Diagnosis present

## 2012-07-18 DIAGNOSIS — M069 Rheumatoid arthritis, unspecified: Secondary | ICD-10-CM | POA: Diagnosis present

## 2012-07-18 DIAGNOSIS — Z79899 Other long term (current) drug therapy: Secondary | ICD-10-CM

## 2012-07-18 DIAGNOSIS — I82509 Chronic embolism and thrombosis of unspecified deep veins of unspecified lower extremity: Secondary | ICD-10-CM | POA: Diagnosis present

## 2012-07-18 DIAGNOSIS — J4489 Other specified chronic obstructive pulmonary disease: Secondary | ICD-10-CM | POA: Diagnosis present

## 2012-07-18 DIAGNOSIS — Z9981 Dependence on supplemental oxygen: Secondary | ICD-10-CM

## 2012-07-18 DIAGNOSIS — J9620 Acute and chronic respiratory failure, unspecified whether with hypoxia or hypercapnia: Secondary | ICD-10-CM | POA: Diagnosis present

## 2012-07-18 DIAGNOSIS — R0609 Other forms of dyspnea: Secondary | ICD-10-CM

## 2012-07-18 DIAGNOSIS — L408 Other psoriasis: Secondary | ICD-10-CM | POA: Diagnosis present

## 2012-07-18 DIAGNOSIS — Z7901 Long term (current) use of anticoagulants: Secondary | ICD-10-CM

## 2012-07-18 DIAGNOSIS — J449 Chronic obstructive pulmonary disease, unspecified: Secondary | ICD-10-CM

## 2012-07-18 DIAGNOSIS — J9610 Chronic respiratory failure, unspecified whether with hypoxia or hypercapnia: Secondary | ICD-10-CM

## 2012-07-18 DIAGNOSIS — J3489 Other specified disorders of nose and nasal sinuses: Secondary | ICD-10-CM

## 2012-07-18 DIAGNOSIS — L4050 Arthropathic psoriasis, unspecified: Secondary | ICD-10-CM | POA: Diagnosis present

## 2012-07-18 DIAGNOSIS — R7309 Other abnormal glucose: Secondary | ICD-10-CM | POA: Diagnosis not present

## 2012-07-18 DIAGNOSIS — R197 Diarrhea, unspecified: Secondary | ICD-10-CM

## 2012-07-18 DIAGNOSIS — R0902 Hypoxemia: Secondary | ICD-10-CM

## 2012-07-18 DIAGNOSIS — J8410 Pulmonary fibrosis, unspecified: Principal | ICD-10-CM

## 2012-07-18 DIAGNOSIS — J189 Pneumonia, unspecified organism: Secondary | ICD-10-CM | POA: Diagnosis present

## 2012-07-18 DIAGNOSIS — Z8719 Personal history of other diseases of the digestive system: Secondary | ICD-10-CM

## 2012-07-18 DIAGNOSIS — E785 Hyperlipidemia, unspecified: Secondary | ICD-10-CM | POA: Diagnosis present

## 2012-07-18 DIAGNOSIS — I2789 Other specified pulmonary heart diseases: Secondary | ICD-10-CM

## 2012-07-18 DIAGNOSIS — D696 Thrombocytopenia, unspecified: Secondary | ICD-10-CM | POA: Diagnosis present

## 2012-07-18 DIAGNOSIS — IMO0002 Reserved for concepts with insufficient information to code with codable children: Secondary | ICD-10-CM

## 2012-07-18 HISTORY — DX: Headache: R51

## 2012-07-18 HISTORY — DX: Shortness of breath: R06.02

## 2012-07-18 HISTORY — DX: Heart failure, unspecified: I50.9

## 2012-07-18 HISTORY — DX: Malignant (primary) neoplasm, unspecified: C80.1

## 2012-07-18 LAB — POCT I-STAT 3, ART BLOOD GAS (G3+): Acid-Base Excess: 2 mmol/L (ref 0.0–2.0)

## 2012-07-18 LAB — URINALYSIS, MICROSCOPIC ONLY
Bilirubin Urine: NEGATIVE
Glucose, UA: NEGATIVE mg/dL
Ketones, ur: NEGATIVE mg/dL
Leukocytes, UA: NEGATIVE
Nitrite: POSITIVE — AB
Protein, ur: NEGATIVE mg/dL
Specific Gravity, Urine: 1.015 (ref 1.005–1.030)
Urobilinogen, UA: 0.2 mg/dL (ref 0.0–1.0)
pH: 5.5 (ref 5.0–8.0)

## 2012-07-18 LAB — COMPREHENSIVE METABOLIC PANEL
Alkaline Phosphatase: 50 U/L (ref 39–117)
BUN: 14 mg/dL (ref 6–23)
CO2: 29 mEq/L (ref 19–32)
Chloride: 103 mEq/L (ref 96–112)
Creatinine, Ser: 1.15 mg/dL (ref 0.50–1.35)
GFR calc Af Amer: 76 mL/min — ABNORMAL LOW (ref >90)
GFR calc non Af Amer: 66 mL/min — ABNORMAL LOW (ref >90)
Glucose, Bld: 134 mg/dL — ABNORMAL HIGH (ref 70–99)
Potassium: 4 mEq/L (ref 3.5–5.1)
Total Bilirubin: 0.7 mg/dL (ref 0.3–1.2)

## 2012-07-18 LAB — CBC WITH DIFFERENTIAL/PLATELET
Basophils Relative: 0 % (ref 0–1)
HCT: 50.3 % (ref 39.0–52.0)
Hemoglobin: 16.4 g/dL (ref 13.0–17.0)
Lymphocytes Relative: 7 % — ABNORMAL LOW (ref 12–46)
Lymphs Abs: 1.2 10*3/uL (ref 0.7–4.0)
MCHC: 32.6 g/dL (ref 30.0–36.0)
Monocytes Absolute: 1.4 10*3/uL — ABNORMAL HIGH (ref 0.1–1.0)
Monocytes Relative: 8 % (ref 3–12)
Neutro Abs: 13.9 10*3/uL — ABNORMAL HIGH (ref 1.7–7.7)
RBC: 5.11 MIL/uL (ref 4.22–5.81)

## 2012-07-18 LAB — STREP PNEUMONIAE URINARY ANTIGEN: Strep Pneumo Urinary Antigen: NEGATIVE

## 2012-07-18 LAB — LACTIC ACID, PLASMA: Lactic Acid, Venous: 2.4 mmol/L — ABNORMAL HIGH (ref 0.5–2.2)

## 2012-07-18 LAB — PRO B NATRIURETIC PEPTIDE: Pro B Natriuretic peptide (BNP): 3784 pg/mL — ABNORMAL HIGH (ref 0–125)

## 2012-07-18 LAB — MRSA PCR SCREENING: MRSA by PCR: NEGATIVE

## 2012-07-18 LAB — PROTIME-INR: Prothrombin Time: 17.3 seconds — ABNORMAL HIGH (ref 11.6–15.2)

## 2012-07-18 LAB — TROPONIN I
Troponin I: <0.3 ng/mL (ref <0.30)
Troponin I: <0.3 ng/mL (ref <0.30)
Troponin I: <0.3 ng/mL (ref <0.30)

## 2012-07-18 MED ORDER — METHYLPREDNISOLONE SODIUM SUCC 125 MG IJ SOLR
125.0000 mg | Freq: Once | INTRAMUSCULAR | Status: AC
  Administered 2012-07-18: 125 mg via INTRAVENOUS
  Filled 2012-07-18: qty 2

## 2012-07-18 MED ORDER — VANCOMYCIN HCL 10 G IV SOLR
1500.0000 mg | INTRAVENOUS | Status: AC
  Administered 2012-07-18: 1500 mg via INTRAVENOUS
  Filled 2012-07-18: qty 1500

## 2012-07-18 MED ORDER — SIMVASTATIN 20 MG PO TABS
20.0000 mg | ORAL_TABLET | Freq: Every day | ORAL | Status: DC
  Administered 2012-07-18 – 2012-07-26 (×9): 20 mg via ORAL
  Filled 2012-07-18 (×10): qty 1

## 2012-07-18 MED ORDER — ALBUTEROL SULFATE (5 MG/ML) 0.5% IN NEBU: 2.5000 mg | INHALATION_SOLUTION | Freq: Four times a day (QID) | RESPIRATORY_TRACT | Status: DC | PRN

## 2012-07-18 MED ORDER — TREPROSTINIL 0.6 MG/ML IN SOLN
18.0000 ug | RESPIRATORY_TRACT | Status: DC
  Administered 2012-07-18 – 2012-07-26 (×24): 18 ug via RESPIRATORY_TRACT

## 2012-07-18 MED ORDER — METHYLPREDNISOLONE SODIUM SUCC 125 MG IJ SOLR
125.0000 mg | Freq: Four times a day (QID) | INTRAMUSCULAR | Status: DC
  Administered 2012-07-18 – 2012-07-22 (×16): 125 mg via INTRAVENOUS
  Filled 2012-07-18 (×21): qty 2

## 2012-07-18 MED ORDER — GUAIFENESIN ER 600 MG PO TB12
1200.0000 mg | ORAL_TABLET | Freq: Two times a day (BID) | ORAL | Status: DC
  Administered 2012-07-18 – 2012-07-25 (×15): 1200 mg via ORAL
  Filled 2012-07-18 (×17): qty 2

## 2012-07-18 MED ORDER — DEXTROSE 5 % IV SOLN
1.0000 g | Freq: Three times a day (TID) | INTRAVENOUS | Status: DC
  Administered 2012-07-18 – 2012-07-20 (×6): 1 g via INTRAVENOUS
  Filled 2012-07-18 (×8): qty 1

## 2012-07-18 MED ORDER — FUROSEMIDE 10 MG/ML IJ SOLN
40.0000 mg | Freq: Once | INTRAMUSCULAR | Status: AC
  Administered 2012-07-18: 40 mg via INTRAVENOUS
  Filled 2012-07-18: qty 4

## 2012-07-18 MED ORDER — VANCOMYCIN HCL 10 G IV SOLR
1250.0000 mg | Freq: Two times a day (BID) | INTRAVENOUS | Status: DC
  Administered 2012-07-18 – 2012-07-19 (×3): 1250 mg via INTRAVENOUS
  Filled 2012-07-18 (×4): qty 1250

## 2012-07-18 MED ORDER — ALBUTEROL SULFATE (5 MG/ML) 0.5% IN NEBU: 2.5000 mg | INHALATION_SOLUTION | RESPIRATORY_TRACT | Status: AC | PRN

## 2012-07-18 MED ORDER — AMBRISENTAN 5 MG PO TABS
5.0000 mg | ORAL_TABLET | Freq: Every day | ORAL | Status: DC
  Administered 2012-07-18 – 2012-07-19 (×2): 5 mg via ORAL
  Filled 2012-07-18: qty 1

## 2012-07-18 MED ORDER — RIVAROXABAN 20 MG PO TABS
20.0000 mg | ORAL_TABLET | Freq: Every day | ORAL | Status: DC
  Administered 2012-07-18 – 2012-07-26 (×9): 20 mg via ORAL
  Filled 2012-07-18 (×11): qty 1

## 2012-07-18 MED ORDER — TREPROSTINIL 0.6 MG/ML IN SOLN
18.0000 ug | Freq: Four times a day (QID) | RESPIRATORY_TRACT | Status: DC
  Filled 2012-07-18 (×34): qty 0.03

## 2012-07-18 MED ORDER — ALBUTEROL SULFATE (5 MG/ML) 0.5% IN NEBU
5.0000 mg | INHALATION_SOLUTION | Freq: Once | RESPIRATORY_TRACT | Status: AC
  Administered 2012-07-18: 5 mg via RESPIRATORY_TRACT
  Filled 2012-07-18: qty 1

## 2012-07-18 MED ORDER — TIOTROPIUM BROMIDE MONOHYDRATE 18 MCG IN CAPS
18.0000 ug | ORAL_CAPSULE | Freq: Every day | RESPIRATORY_TRACT | Status: DC
  Administered 2012-07-18 – 2012-07-27 (×9): 18 ug via RESPIRATORY_TRACT
  Filled 2012-07-18 (×2): qty 5

## 2012-07-18 MED ORDER — LEVOFLOXACIN IN D5W 750 MG/150ML IV SOLN
750.0000 mg | INTRAVENOUS | Status: AC
  Administered 2012-07-18 – 2012-07-19 (×2): 750 mg via INTRAVENOUS
  Filled 2012-07-18 (×2): qty 150

## 2012-07-18 MED ORDER — TREPROSTINIL 0.6 MG/ML IN SOLN
18.0000 ug | Freq: Four times a day (QID) | RESPIRATORY_TRACT | Status: DC
  Filled 2012-07-18 (×3): qty 0.03

## 2012-07-18 NOTE — Consult Note (Signed)
PULMONARY  / CRITICAL CARE MEDICINE  Name: [name] MRN: [id number] DOB: 1950/05/31    ADMISSION DATE:  07/18/2012 CONSULTATION DATE:  3/31  REFERRING MD : Cruzita Lederer    CHIEF COMPLAINT: acute on chronic resp failure   BRIEF PATIENT DESCRIPTION:  This is a 62 year old male who is O2 dep (6-8L & Pred dep 31m) at baseline d/t psoriatic arthritis associated ILD w/ severe secondary PAH (on tyvazo and letaris) and related cor pulmonale. Admitted w/ acute on chronic resp failure on 3/31  SIGNIFICANT EVENTS / STUDIES:  Echo 3/31>>>  LINES / TUBES:   CULTURES: U strep 3/31>>> U legionella 3/31>>>  ANTIBIOTICS: Cefepime 3/31>>> vanc 3/31>>> levaquin 3/31>>>  HISTORY OF PRESENT ILLNESS:    This is a 62year old male who is O2 dep (6-8L & Pred dep 178m at baseline d/t psoriatic arthritis associated ILD w/ severe secondary PAH and related cor pulmonale (on tyvazo and letaris) . At baseline can ambulate ~5060fefore onset of dyspnea. Presents to ER on 3/31 w/ 2d progressive dyspnea and chills. Denied change in cough, sick exposure, URI symptoms, wheezing, Chest pain or LE selling. Admitted by IM service PCCM asked to see given concern for progressive resp failure.  PAST MEDICAL HISTORY :  Past Medical History  Diagnosis Date  . Pulmonary hypertension   . Thrombocytopenia, unspecified   . Chronic pulmonary heart disease, unspecified   . Peripheral vascular disease, unspecified   . Other and unspecified hyperlipidemia   . Postinflammatory pulmonary fibrosis   . Other psoriasis   . Rheumatoid arthritis   . Chronic respiratory failure   . Hypoxemia   . Chronic airway obstruction, not elsewhere classified   . Hyperlipidemia   . Perforation of colon 10/2001    After colonoscopy on 10/18/2001  . Internal hemorrhoids   . Diverticulosis    Past Surgical History  Procedure Laterality Date  . Repair of cecal perforation  10/2001   Prior to Admission medications   Medication Sig  Start Date End Date Taking? Authorizing Provider  acetaminophen (TYLENOL ARTHRITIS PAIN) 650 MG CR tablet Take 1,300 mg by mouth 2 (two) times daily.    Yes Historical Provider, MD  adalimumab (HUMIRA) 40 MG/0.8ML injection Inject 40 mg into the skin every 14 (fourteen) days.    Yes Historical Provider, MD  albuterol (PROVENTIL) (2.5 MG/3ML) 0.083% nebulizer solution Take 3 mLs (2.5 mg total) by nebulization every 6 (six) hours as needed for wheezing. 06/21/12  Yes RobCollene GobbleD  ambrisentan (LETAIRIS) 5 MG tablet Take 5 mg by mouth daily.   Yes Historical Provider, MD  b complex vitamins tablet Take 1 tablet by mouth daily.   Yes Historical Provider, MD  Calcipotriene-Betameth Diprop (TACLONEX EX) Apply 1 application topically daily.    Yes Historical Provider, MD  calcium-vitamin D (OSCAL) 250-125 MG-UNIT per tablet Take 1 tablet by mouth 2 (two) times daily.   Yes Historical Provider, MD  cetirizine (ZYRTEC) 10 MG tablet Take 10 mg by mouth daily.     Yes Historical Provider, MD  folic acid (FOLVITE) 1 MG tablet Take 1 mg by mouth daily.     Yes Historical Provider, MD  furosemide (LASIX) 40 MG tablet Take 40-80 mg by mouth 2 (two) times daily. 80 mg every morning and 40 mg every night   Yes Historical Provider, MD  guaiFENesin (MUCINEX) 600 MG 12 hr tablet Take 1,200 mg by mouth 2 (two) times daily.     Yes Historical  Provider, MD  Multiple Vitamin (MULTIVITAMIN) capsule Take 1 capsule by mouth daily.     Yes Historical Provider, MD  pantoprazole (PROTONIX) 40 MG tablet Take 40 mg by mouth daily.   Yes Historical Provider, MD  potassium chloride SA (K-DUR,KLOR-CON) 20 MEQ tablet Take 40 mEq by mouth 2 (two) times daily.   Yes Historical Provider, MD  predniSONE (DELTASONE) 10 MG tablet Take 1 tablet (10 mg total) by mouth daily. 02/26/11  Yes Collene Gobble, MD  simvastatin (ZOCOR) 20 MG tablet Take 20 mg by mouth at bedtime.    Yes Historical Provider, MD  thiamine 100 MG tablet Take 100 mg  by mouth daily.     Yes Historical Provider, MD  tiotropium (SPIRIVA) 18 MCG inhalation capsule Place 1 capsule (18 mcg total) into inhaler and inhale daily. 06/30/12  Yes Collene Gobble, MD  Treprostinil (TYVASO IN) Inhale 12 puffs into the lungs 4 (four) times daily.   Yes Historical Provider, MD  XARELTO 20 MG TABS Take 20 mg by mouth daily. 1 by mouth daily 05/14/11  Yes Historical Provider, MD   No Known Allergies  FAMILY HISTORY:  Family History  Problem Relation Age of Onset  . Heart disease Mother   . Diabetes Mother   . Diabetes Brother    SOCIAL HISTORY:  reports that he quit smoking about 6 years ago. His smoking use included Cigarettes. He has a 84 pack-year smoking history. He has never used smokeless tobacco. He reports that he does not drink alcohol or use illicit drugs.  REVIEW OF SYSTEMS:  (bolds are positive)  Constitutional: Negative for fever, chills, weight loss, malaise/fatigue and diaphoresis.  HENT: Negative for hearing loss, ear pain, nosebleeds, congestion, sore throat, neck pain, tinnitus and ear discharge.   Eyes: Negative for blurred vision, double vision, photophobia, pain, discharge and redness.  Respiratory: Negative for cough, hemoptysis, sputum production, shortness of breath, wheezing and stridor.   Cardiovascular: Negative for chest pain, palpitations, orthopnea, claudication, leg swelling and PND.  Gastrointestinal: Negative for heartburn, nausea, vomiting, abdominal pain, diarrhea, constipation, blood in stool and melena.  Genitourinary: Negative for dysuria, urgency, frequency, hematuria and flank pain.  Musculoskeletal: Negative for myalgias, back pain, joint pain and falls.  Skin: Negative for itching and rash.  Neurological: Negative for dizziness, tingling, tremors, sensory change, speech change, focal weakness, seizures, loss of consciousness, weakness and headaches.  Endo/Heme/Allergies: Negative for environmental allergies and polydipsia. Does  not bruise/bleed easily.  SUBJECTIVE:  SOB w/ any exertion  VITAL SIGNS: Pulse Rate:  [87-101] 94 (03/31 0945) Resp:  [14-25] 14 (03/31 1110) BP: (108-124)/(52-78) 108/52 mmHg (03/31 1110) SpO2:  [88 %-95 %] 91 % (03/31 1110) Weight:  [92.4 kg (203 lb 11.3 oz)] 92.4 kg (203 lb 11.3 oz) (03/31 0945)  PHYSICAL EXAMINATION: General:  Awake, alert no focal def  Neuro:  Awake alert  HEENT:  [place], no JVD  Cardiovascular:  rrr Lungs:  Bibasilar rales  Abdomen:  Soft, non-tender  Musculoskeletal:  Intact  Skin:  No sig edema    Recent Labs Lab 07/18/12 0811  NA 142  K 4.0  CL 103  CO2 29  BUN 14  CREATININE 1.15  GLUCOSE 134*    Recent Labs Lab 07/18/12 0811  HGB 16.4  HCT 50.3  WBC 16.6*  PLT 121*   Dg Chest Port 1 View  07/18/2012  *RADIOLOGY REPORT*  Clinical Data: Shortness of breath, fever, headache.  PORTABLE CHEST - 1 VIEW  Comparison: 05/17/2012.  Findings: The patient is slightly rotated.  Trachea is grossly midline.  Heart is enlarged, stable.  There is coarsening of the pulmonary markings, with mild patchiness.  No definite airspace consolidation or pleural fluid.  IMPRESSION:  1.  No acute findings. 2.  Emphysema with possible superimposed fibrosis.   Original Report Authenticated By: Lorin Picket, M.D.     ASSESSMENT / PLAN:  Acute on chronic respiratory failure. Oxygen dependent 6-8 liters at baseline  IPF associated w/ psoriatic arthritis: pred dependent  Severe secondary PAH w/ cor pulmonale: On Tyvaso and letaris  COPD: no evidence of AECOPD Possible PNA  Presume this is IPF flare/ acute pneumonitis, alternately consider infection/ CAP. Do not think this is d/t AECOPD and doubt heart failure, but there may be some element of volume excess given severe PAH and cor pulmonale as a contributing factor. Doubt PE as was on xarelto. He had a DNR/ "yellow sheet" at home. Currently wants to "make it until may as he wants to see his grandson". Not sure this is a  reasonable goal given his advanced lung disease. Will need to re-visit this, until then we will do absolutely everything we can to try to prevent placing him on the vent as he is unlikely to come off after placed.  >did not tolerate CPAP  Plan/rec Empiric abx Systemic steroids-->for presumed IPF flare Low dose diuretics Scheduled BDs Will have him speak further to his primary pulmonologist ([place]rum) to discuss his goals of care.  Continue Tyvaso and letaris     Merton Border, MD ; [facility] [phone].  After 5:30 PM or weekends, call [phone]   Pulmonary and [place] Pager: [phone]  07/18/2012, 11:38 AM

## 2012-07-18 NOTE — ED Provider Notes (Signed)
History     CSN: [id number]  Arrival date & time 07/18/12  0801   First MD Initiated Contact with Patient 07/18/12 0801      Chief Complaint  Patient presents with  . Shortness of Breath    (Consider location/radiation/quality/duration/timing/severity/associated sxs/prior treatment) HPI Comments: Patient history of COPD, pulmonary fibrosis, and left-sided heart failure along with chronic DVT on for about 2, presents with worsening shortness of breath. He is oxygen dependent and uses nebulizers at home. He states over the last 2-3 days she's had worsening shortness of breath. On EMS arrival his oxygen saturations were in the mid 70s on oxygen. He was placed on nonrebreather and his sats improved to 95%. He denies any chest pain or tightness. He denies any pleuritic discomfort. He denies any increased swelling in his legs or pain in his legs. He denies abdominal pain. He denies any fevers or chills. He states he has not used his nebulizer treatments at home.  Patient is a 62 y.o. male presenting with shortness of breath.  Shortness of Breath Associated symptoms: cough   Associated symptoms: no abdominal pain, no chest pain, no diaphoresis, no fever, no headaches, no rash and no vomiting     Past Medical History  Diagnosis Date  . Pulmonary hypertension   . Thrombocytopenia, unspecified   . Chronic pulmonary heart disease, unspecified   . Peripheral vascular disease, unspecified   . Other and unspecified hyperlipidemia   . Postinflammatory pulmonary fibrosis   . Other psoriasis   . Rheumatoid arthritis   . Chronic respiratory failure   . Hypoxemia   . Chronic airway obstruction, not elsewhere classified   . Hyperlipidemia   . Perforation of colon 10/2001    After colonoscopy on 10/18/2001  . Internal hemorrhoids   . Diverticulosis     Past Surgical History  Procedure Laterality Date  . Repair of cecal perforation  10/2001    Family History  Problem Relation Age of Onset  .  Heart disease Mother   . Diabetes Mother   . Diabetes Brother     History  Substance Use Topics  . Smoking status: Former Smoker -- 2.00 packs/day for 42 years    Types: Cigarettes    Quit date: 04/20/2006  . Smokeless tobacco: Never Used  . Alcohol Use: No      Review of Systems  Constitutional: Positive for fatigue. Negative for fever, chills and diaphoresis.  HENT: Negative for congestion, rhinorrhea and sneezing.   Eyes: Negative.   Respiratory: Positive for cough and shortness of breath. Negative for chest tightness.   Cardiovascular: Negative for chest pain and leg swelling.  Gastrointestinal: Negative for nausea, vomiting, abdominal pain, diarrhea and blood in stool.  Genitourinary: Negative for frequency, hematuria, flank pain and difficulty urinating.  Musculoskeletal: Negative for back pain and arthralgias.  Skin: Negative for rash.  Neurological: Negative for dizziness, speech difficulty, weakness, numbness and headaches.    Allergies  Review of patient's allergies indicates no known allergies.  Home Medications   Current Outpatient Rx  Name  Route  Sig  Dispense  Refill  . acetaminophen (TYLENOL ARTHRITIS PAIN) 650 MG CR tablet   Oral   Take 1,300 mg by mouth 2 (two) times daily.          Marland Kitchen adalimumab (HUMIRA) 40 MG/0.8ML injection   Subcutaneous   Inject 40 mg into the skin every 14 (fourteen) days.          Marland Kitchen albuterol (PROVENTIL) (2.5 MG/3ML) 0.083%  nebulizer solution   Nebulization   Take 3 mLs (2.5 mg total) by nebulization every 6 (six) hours as needed for wheezing.   360 mL   3   . ambrisentan (LETAIRIS) 5 MG tablet   Oral   Take 5 mg by mouth daily.         Marland Kitchen b complex vitamins tablet   Oral   Take 1 tablet by mouth daily.         . Calcipotriene-Betameth Diprop (TACLONEX EX)   Apply externally   Apply 1 application topically daily.          . calcium-vitamin D (OSCAL) 250-125 MG-UNIT per tablet   Oral   Take 1 tablet by  mouth 2 (two) times daily.         . cetirizine (ZYRTEC) 10 MG tablet   Oral   Take 10 mg by mouth daily.           . folic acid (FOLVITE) 1 MG tablet   Oral   Take 1 mg by mouth daily.           . furosemide (LASIX) 40 MG tablet   Oral   Take 40-80 mg by mouth 2 (two) times daily. 80 mg every morning and 40 mg every night         . guaiFENesin (MUCINEX) 600 MG 12 hr tablet   Oral   Take 1,200 mg by mouth 2 (two) times daily.           . Multiple Vitamin (MULTIVITAMIN) capsule   Oral   Take 1 capsule by mouth daily.           . pantoprazole (PROTONIX) 40 MG tablet   Oral   Take 40 mg by mouth daily.         . potassium chloride SA (K-DUR,KLOR-CON) 20 MEQ tablet   Oral   Take 40 mEq by mouth 2 (two) times daily.         . predniSONE (DELTASONE) 10 MG tablet   Oral   Take 1 tablet (10 mg total) by mouth daily.   90 tablet   3   . simvastatin (ZOCOR) 20 MG tablet   Oral   Take 20 mg by mouth at bedtime.          . thiamine 100 MG tablet   Oral   Take 100 mg by mouth daily.           Marland Kitchen tiotropium (SPIRIVA) 18 MCG inhalation capsule   Inhalation   Place 1 capsule (18 mcg total) into inhaler and inhale daily.   90 capsule   6   . Treprostinil (TYVASO IN)   Inhalation   Inhale 12 puffs into the lungs 4 (four) times daily.         Alveda Reasons 20 MG TABS   Oral   Take 20 mg by mouth daily. 1 by mouth daily           BP 110/70  Pulse 87  Resp 19  SpO2 89%  Physical Exam  Constitutional: He is oriented to person, place, and time. He appears well-developed and well-nourished.  HENT:  Head: Normocephalic and atraumatic.  Eyes: Pupils are equal, round, and reactive to light.  Neck: Normal range of motion. Neck supple.  Cardiovascular: Normal rate, regular rhythm and normal heart sounds.   Pulmonary/Chest: Effort normal. No respiratory distress. He has rales. He exhibits no tenderness.  Patient has some diminished breath sounds  bilaterally.  There a few crackles in the bases bilaterally. He had some mild tachypnea but is talking in full sentences. There some mild increased work of breathing.  Abdominal: Soft. Bowel sounds are normal. There is no tenderness. There is no rebound and no guarding.  Musculoskeletal: Normal range of motion. He exhibits no edema and no tenderness.  Lymphadenopathy:    He has no cervical adenopathy.  Neurological: He is alert and oriented to person, place, and time.  Skin: Skin is warm and dry. No rash noted.  Psychiatric: He has a normal mood and affect.    ED Course  Procedures (including critical care time)  Results for orders placed during the hospital encounter of 07/18/12  CBC WITH DIFFERENTIAL      Result Value Range   WBC 16.6 (*) 4.0 - 10.5 K/uL   RBC 5.11  4.22 - 5.81 MIL/uL   Hemoglobin 16.4  13.0 - 17.0 g/dL   HCT 50.3  39.0 - 52.0 %   MCV 98.4  78.0 - 100.0 fL   MCH 32.1  26.0 - 34.0 pg   MCHC 32.6  30.0 - 36.0 g/dL   RDW 18.0 (*) 11.5 - 15.5 %   Platelets 121 (*) 150 - 400 K/uL   Neutrophils Relative 84 (*) 43 - 77 %   Neutro Abs 13.9 (*) 1.7 - 7.7 K/uL   Lymphocytes Relative 7 (*) 12 - 46 %   Lymphs Abs 1.2  0.7 - 4.0 K/uL   Monocytes Relative 8  3 - 12 %   Monocytes Absolute 1.4 (*) 0.1 - 1.0 K/uL   Eosinophils Relative 1  0 - 5 %   Eosinophils Absolute 0.1  0.0 - 0.7 K/uL   Basophils Relative 0  0 - 1 %   Basophils Absolute 0.0  0.0 - 0.1 K/uL  COMPREHENSIVE METABOLIC PANEL      Result Value Range   Sodium 142  135 - 145 mEq/L   Potassium 4.0  3.5 - 5.1 mEq/L   Chloride 103  96 - 112 mEq/L   CO2 29  19 - 32 mEq/L   Glucose, Bld 134 (*) 70 - 99 mg/dL   BUN 14  6 - 23 mg/dL   Creatinine, Ser 1.15  0.50 - 1.35 mg/dL   Calcium 8.5  8.4 - 10.5 mg/dL   Total Protein 6.2  6.0 - 8.3 g/dL   Albumin 3.2 (*) 3.5 - 5.2 g/dL   AST 20  0 - 37 U/L   ALT 24  0 - 53 U/L   Alkaline Phosphatase 50  39 - 117 U/L   Total Bilirubin 0.7  0.3 - 1.2 mg/dL   GFR calc non  Af Amer 66 (*) >90 mL/min   GFR calc Af Amer 76 (*) >90 mL/min  PRO B NATRIURETIC PEPTIDE      Result Value Range   Pro B Natriuretic peptide (BNP) 3784.0 (*) 0 - 125 pg/mL  PROTIME-INR      Result Value Range   Prothrombin Time 17.3 (*) 11.6 - 15.2 seconds   INR 1.46  0.00 - 1.49  POCT I-STAT TROPONIN I      Result Value Range   Troponin i, poc 0.05  0.00 - 0.08 ng/mL   Comment 3            Dg Chest Port 1 View  07/18/2012  *RADIOLOGY REPORT*  Clinical Data: Shortness of breath, fever, headache.  PORTABLE CHEST - 1 VIEW  Comparison: 05/17/2012.  Findings: The patient is slightly rotated.  Trachea is grossly midline.  Heart is enlarged, stable.  There is coarsening of the pulmonary markings, with mild patchiness.  No definite airspace consolidation or pleural fluid.  IMPRESSION:  1.  No acute findings. 2.  Emphysema with possible superimposed fibrosis.   Original Report Authenticated By: Lorin Picket, M.D.     Date: 07/18/2012  Rate: 98  Rhythm: normal sinus rhythm  QRS Axis: normal  Intervals: PR prolonged  ST/T Wave abnormalities: nonspecific ST/T changes  Conduction Disutrbances:first-degree A-V block   Narrative Interpretation:   Old EKG Reviewed: unchanged    1. COPD (chronic obstructive pulmonary disease)       MDM  Patient with chronic lung disease including COPD and pulmonary fibrosis. He is oxygen dependent at home with oxygen uses up to 8 L per minute. He states is chronic oxygen saturations 89-90 on the oxygen. He was given a dose of steroids here as well as albuterol med and he is feeling better after this but he still requiring a nonrebreather mask at 12 L per minute his oxygen saturation at this level is 89-90. He still feels short of breath. He currently is in no respiratory distress. He has no increased work of breathing. There is no evidence of pneumonia on chest x-ray. There is no evidence of pulmonary edema. He as no ischemic changes on EKG. I will contact the  hospitalist for admission.        Malvin Johns, MD 07/18/12 [phone]

## 2012-07-18 NOTE — Progress Notes (Signed)
ANTIBIOTIC CONSULT NOTE - FOLLOW UP  Pharmacy Consult:  Vancomycin Indication:   Suspected PNA  No Known Allergies  Patient Measurements: Height: 5' 8.11" (173 cm) Weight: 203 lb 11.3 oz (92.4 kg) IBW/kg (Calculated) : 68.65  Vital Signs: BP: 124/78 mmHg (03/31 0945) Pulse Rate: 94 (03/31 0945)  Labs:  Recent Labs  07/18/12 0811  WBC 16.6*  HGB 16.4  PLT 121*  CREATININE 1.15   Estimated Creatinine Clearance: 72.7 ml/min (by C-G formula based on Cr of 1.15). No results found for this basename: VANCOTROUGH, VANCOPEAK, VANCORANDOM, GENTTROUGH, GENTPEAK, GENTRANDOM, TOBRATROUGH, TOBRAPEAK, TOBRARND, AMIKACINPEAK, AMIKACINTROU, AMIKACIN,  in the last 72 hours   Microbiology: No results found for this or any previous visit (from the past 720 hour(s)).      Assessment: 37 YOM with known pulmonary hypertension and COPD on home oxygen admitted with SOB.  Pharmacy consulted to start broad spectrum antibiotics for suspected PNA.  Patient with good renal function.   Vanc 3/31 >> Cefepime 3/31 >> LVQ 3/31 >>   Goal of Therapy:  Vancomycin trough level 15-20 mcg/ml   Plan:  - Vanc 1558m x 1, then 12521mIV Q12H - Continue Cefepime and LVQ as ordered - Monitor renal fxn, clinical course, and vanc trough as indicated     Jenessa Gillingham D. DaMina MarblePharmD, BCPS Pager:  [phone]/31/2014, 10:42 AM

## 2012-07-18 NOTE — ED Notes (Signed)
Per report pt was at home and began to have sob onset this am.  On ems arrival pt was noted to have hypoxia sats in the mid 70's.  A 100% NRB was placed and sats improved to 95%.  On arrival to ER pt noted have mild SOB.  Skin hot to touch.  Resp symmetrical and labored.

## 2012-07-18 NOTE — ED Notes (Signed)
Pt found to be slumped down in the bed.  O2 sats 86% on 100% NRB.  Pt repositioned with positive response.  O2 sats improved to 92% on 100% NRB.  Pt states that he is comfortable at the present but does still appear to have tachypnea.

## 2012-07-18 NOTE — Progress Notes (Signed)
Echo completed

## 2012-07-18 NOTE — Care Management Note (Signed)
    Page 1 of 1   07/18/2012     3:24:58 PM   CARE MANAGEMENT NOTE 07/18/2012  Patient:  TAEVION, SIKORA   Account Number:  [id number]  Date Initiated:  07/18/2012  Documentation initiated by:  Elissa Hefty  Subjective/Objective Assessment:   adm w shortness of breath     Action/Plan:   lives w wife, pcp dr Banker   Anticipated DC Date:     Anticipated DC Plan:        [place]  CM consult      Choice offered to / List presented to:             Status of service:   Medicare Important Message given?   (If response is "NO", the following Medicare IM given date fields will be blank) Date Medicare IM given:   Date Additional Medicare IM given:    Discharge Disposition:    Per UR Regulation:  Reviewed for med. necessity/level of care/duration of stay  If discussed at [place] of Stay Meetings, dates discussed:    Comments:  3/31 1524 debbie Susane Bey rn,bsn

## 2012-07-18 NOTE — Progress Notes (Signed)
Order received to place on CPAP.  Placed on CPAP with large full face mask.  Initially placed on CPAP 8 cmH2O with 8l O2 bleed in.  SpO2 83%, increased CPAP 10 with 15 l O2.  SpO2 still 84-86, increased CPAP 12 with no change.  Patient stated he felt like he was smothering, and wanted to have mask removed.  Attempted BiPap 12/6 for comfort, with no change in effect.  Removed mask, and placed on 100% NRB.  Pt more comfortable, SpO2 increased to 94%.  Marni Griffon, ACNP made aware.  Will continue to monitor.

## 2012-07-18 NOTE — H&P (Signed)
[facility] History and Physical  ABEM SHADDIX JIR:[id number] DOB: Sep 27, 1950 DOA: 07/18/2012  PCP: Horatio Pel, MD  Specialists: Pulmonary  Chief Complaint: shortness of breath  HPI: [name] is a 62 y.o. male has a past medical history significant for ILD, psoriatic arthritis, chronic hypoxemia, COPD, pulmonary hypertension, comes with 1 day of cough with sputum production, subjective fevers and more short of breath than normal. He is on 6-8L chronic O2 at home and on chronic steroids. Was evaluated for lung tx at [facility] however - per patient - not a candidate. Denies chest pain, lightheadedness/dizziness.   Review of Systems: as per HPI otherwise negative.   Past Medical History  Diagnosis Date  . Pulmonary hypertension   . Thrombocytopenia, unspecified   . Chronic pulmonary heart disease, unspecified   . Peripheral vascular disease, unspecified   . Other and unspecified hyperlipidemia   . Postinflammatory pulmonary fibrosis   . Other psoriasis   . Rheumatoid arthritis   . Chronic respiratory failure   . Hypoxemia   . Chronic airway obstruction, not elsewhere classified   . Hyperlipidemia   . Perforation of colon 10/2001    After colonoscopy on 10/18/2001  . Internal hemorrhoids   . Diverticulosis    Past Surgical History  Procedure Laterality Date  . Repair of cecal perforation  10/2001   Social History:  reports that he quit smoking about 6 years ago. His smoking use included Cigarettes. He has a 84 pack-year smoking history. He has never used smokeless tobacco. He reports that he does not drink alcohol or use illicit drugs.  No Known Allergies  Family History  Problem Relation Age of Onset  . Heart disease Mother   . Diabetes Mother   . Diabetes Brother     Prior to Admission medications   Medication Sig Start Date End Date Taking? Authorizing Provider  acetaminophen (TYLENOL ARTHRITIS PAIN) 650 MG CR tablet Take 1,300 mg by mouth 2 (two)  times daily.    Yes Historical Provider, MD  adalimumab (HUMIRA) 40 MG/0.8ML injection Inject 40 mg into the skin every 14 (fourteen) days.    Yes Historical Provider, MD  albuterol (PROVENTIL) (2.5 MG/3ML) 0.083% nebulizer solution Take 3 mLs (2.5 mg total) by nebulization every 6 (six) hours as needed for wheezing. 06/21/12  Yes Collene Gobble, MD  ambrisentan (LETAIRIS) 5 MG tablet Take 5 mg by mouth daily.   Yes Historical Provider, MD  b complex vitamins tablet Take 1 tablet by mouth daily.   Yes Historical Provider, MD  Calcipotriene-Betameth Diprop (TACLONEX EX) Apply 1 application topically daily.    Yes Historical Provider, MD  calcium-vitamin D (OSCAL) 250-125 MG-UNIT per tablet Take 1 tablet by mouth 2 (two) times daily.   Yes Historical Provider, MD  cetirizine (ZYRTEC) 10 MG tablet Take 10 mg by mouth daily.     Yes Historical Provider, MD  folic acid (FOLVITE) 1 MG tablet Take 1 mg by mouth daily.     Yes Historical Provider, MD  furosemide (LASIX) 40 MG tablet Take 40-80 mg by mouth 2 (two) times daily. 80 mg every morning and 40 mg every night   Yes Historical Provider, MD  guaiFENesin (MUCINEX) 600 MG 12 hr tablet Take 1,200 mg by mouth 2 (two) times daily.     Yes Historical Provider, MD  Multiple Vitamin (MULTIVITAMIN) capsule Take 1 capsule by mouth daily.     Yes Historical Provider, MD  pantoprazole (PROTONIX) 40 MG tablet Take 40 mg  by mouth daily.   Yes Historical Provider, MD  potassium chloride SA (K-DUR,KLOR-CON) 20 MEQ tablet Take 40 mEq by mouth 2 (two) times daily.   Yes Historical Provider, MD  predniSONE (DELTASONE) 10 MG tablet Take 1 tablet (10 mg total) by mouth daily. 02/26/11  Yes Collene Gobble, MD  simvastatin (ZOCOR) 20 MG tablet Take 20 mg by mouth at bedtime.    Yes Historical Provider, MD  thiamine 100 MG tablet Take 100 mg by mouth daily.     Yes Historical Provider, MD  tiotropium (SPIRIVA) 18 MCG inhalation capsule Place 1 capsule (18 mcg total) into  inhaler and inhale daily. 06/30/12  Yes Collene Gobble, MD  Treprostinil (TYVASO IN) Inhale 12 puffs into the lungs 4 (four) times daily.   Yes Historical Provider, MD  XARELTO 20 MG TABS Take 20 mg by mouth daily. 1 by mouth daily 05/14/11  Yes Historical Provider, MD   Physical Exam: Filed Vitals:   07/18/12 0830 07/18/12 0900 07/18/12 0915 07/18/12 0945  BP: 113/68 119/69 110/70 124/78  Pulse: 101 97 87 94  Resp: _0 Height:    5' 8.11" (1.73 m)  Weight:    92.4 kg (203 lb 11.3 oz)  SpO2: 95% 89% 89% 88%     General:  Distressed and tachypneic in the ED  Eyes: EOMI  Neck: supple, no JVD  Cardiovascular: RRR without MRG, tachycardic  Respiratory: severe diffuse wheezing bilaterally  Abdomen: soft, non tender to palpation, positive bowel sounds, no guarding, no rebound  Skin: no rashes  Musculoskeletal: no peripheral edema  Psychiatric: normal mood and affect  Neurologic: CN 2-12 grossly intact, MS 5/5 in all 4  Labs on Admission:  Basic Metabolic Panel:  Recent Labs Lab 07/18/12 0811  NA 142  K 4.0  CL 103  CO2 29  GLUCOSE 134*  BUN 14  CREATININE 1.15  CALCIUM 8.5   Liver Function Tests:  Recent Labs Lab 07/18/12 0811  AST 20  ALT 24  ALKPHOS 50  BILITOT 0.7  PROT 6.2  ALBUMIN 3.2*   No results found for this basename: LIPASE, AMYLASE,  in the last 168 hours No results found for this basename: AMMONIA,  in the last 168 hours CBC:  Recent Labs Lab 07/18/12 0811  WBC 16.6*  NEUTROABS 13.9*  HGB 16.4  HCT 50.3  MCV 98.4  PLT 121*   Cardiac Enzymes: No results found for this basename: CKTOTAL, CKMB, CKMBINDEX, TROPONINI,  in the last 168 hours  BNP (last 3 results)  Recent Labs  11/13/11 1454 07/18/12 0812  PROBNP 185.0* 3784.0*   CBG: No results found for this basename: GLUCAP,  in the last 168 hours  Radiological Exams on Admission: Dg Chest Port 1 View  07/18/2012  *RADIOLOGY REPORT*  Clinical Data: Shortness of  breath, fever, headache.  PORTABLE CHEST - 1 VIEW  Comparison: 05/17/2012.  Findings: The patient is slightly rotated.  Trachea is grossly midline.  Heart is enlarged, stable.  There is coarsening of the pulmonary markings, with mild patchiness.  No definite airspace consolidation or pleural fluid.  IMPRESSION:  1.  No acute findings. 2.  Emphysema with possible superimposed fibrosis.   Original Report Authenticated By: Lorin Picket, M.D.     EKG: Independently reviewed.  Assessment/Plan Active Problems:   HYPERLIPIDEMIA   THROMBOCYTOPENIA   PULMONARY HYPERTENSION, SECONDARY   COR PULMONALE   C O P D   PULMONARY FIBROSIS   RESPIRATORY FAILURE, CHRONIC  PSORIASIS   ARTHRITIS, RHEUMATOID   Hypoxemia   DYSPNEA   Advanced lung disease - due to ILD and concomitant pulmonary hypertension. Pulm consulted. Patient extremely tearful when asked about intubation. Pulm will also discuss with him about that as well as he is unlikely to come off of the vent if he gets intubated.   Acute on chronic hypoxemic respiratory failure - on 100% FIO2, NRB  COPD exacerbation - febrile, elevated WBC (on Prednisone however). Abx, steroids.  Elevated BNP - not fluid overload on exam, 2D echo  Chronic DVT - continue Xarelto  DVT Prophylaxis - on anticoagulation  Code Status: Full  Family Communication: none  Disposition Plan: stepdown  Time spent: 74  Geramy Lamorte M. Cruzita Lederer, MD [facility] Pager [phone]  If 7PM-7AM, please contact night-coverage [email or web address] Password [email or web address] 07/18/2012, 10:50 AM

## 2012-07-18 NOTE — ED Notes (Signed)
Attempted to place pt on 50% NRB without success.  Pt O2 sats decreased from 94% to 89% EDP notified.

## 2012-07-19 DIAGNOSIS — I2789 Other specified pulmonary heart diseases: Secondary | ICD-10-CM

## 2012-07-19 DIAGNOSIS — J449 Chronic obstructive pulmonary disease, unspecified: Secondary | ICD-10-CM

## 2012-07-19 LAB — BASIC METABOLIC PANEL
CO2: 25 mEq/L (ref 19–32)
GFR calc non Af Amer: 87 mL/min — ABNORMAL LOW (ref >90)
Glucose, Bld: 168 mg/dL — ABNORMAL HIGH (ref 70–99)
Potassium: 4.2 mEq/L (ref 3.5–5.1)
Sodium: 139 mEq/L (ref 135–145)

## 2012-07-19 LAB — CBC
Hemoglobin: 15.5 g/dL (ref 13.0–17.0)
MCH: 31.8 pg (ref 26.0–34.0)
RBC: 4.87 MIL/uL (ref 4.22–5.81)
WBC: 17.1 10*3/uL — ABNORMAL HIGH (ref 4.0–10.5)

## 2012-07-19 LAB — LEGIONELLA ANTIGEN, URINE

## 2012-07-19 NOTE — Progress Notes (Signed)
~  0415: Placed pt on venti mask at 50%, see vitals, do not want to over oxygenate. Noted pt O2 sats ranging between 87%-89%, sitting mostly at 87%. Discuss with pt O2 sats at home. Pt states "They range from 70-80%. They rarely are in the 90's%." Pt discuss with this nurse what he does when his O2 sats are in 70% range. Noted orders state to keep sats>92%.  NP called, discussed pt hx, course of care, and status in length. Ordered to place pt back on Non-rebreather at 12L and monitor pt sats and titrate. Sats ranging from 87-89%. Pt calls this nurse and describes "I feel like I am kind of suffocating". Discussed oxygen levels with pt. Placed pt on 15L Non-rebreather. Pt states he feels better. O2 sats ranging from 98-94%.  Pt sleeping 0630: NP called check on pt. Discussed pt's episode and that this nurse placed him on 15L Non-rebreather for comfort. Will continue to monitor and pass to oncoming shift.

## 2012-07-19 NOTE — Progress Notes (Signed)
[facility] Progress Note [place] TEAM 1 - Stepdown/ICU TEAM   PENN GRISSETT BVQ:[id number] DOB: Jul 26, 1950 DOA: 07/18/2012 PCP: Horatio Pel, MD  Brief narrative/ obtained from HPI: [name] is a 62 y.o. male has a past medical history significant for ILD, psoriatic arthritis, chronic hypoxemia, COPD, pulmonary hypertension, comes with 1 day of cough with sputum production, subjective fevers and more short of breath than normal. He is on 6-8L chronic O2 at home and on chronic steroids. Was evaluated for lung tx at [facility] however, per patient, he is not a candidate. Denies chest pain, lightheadedness/dizziness.    Assessment/Plan: Principal Problem:   Acute and chronic respiratory failure/  C O P D/PULMONARY FIBROSIS Appreciate pulm eval -advised to continue systemic steroids for IPF flare and antibiotics -cont Tyvaso and Letairis  Active Problems:   COR PULMONALE/  PULMONARY HYPERTENSION, SECONDARY   - one time dose of Lasix administered yesterday    ARTHRITIS, RHEUMATOID Stable    HYPERLIPIDEMIA Cont statin    THROMBOCYTOPENIA - possibly due to acute illness    PSORIASIS   Code Status: full code Family Communication: none Disposition Plan: cont to follow in SDU  Consultants: pulm  Procedures: none  Antibiotics: VAnc 3/31 Levequin 3/31  Cefepime 3/31  DVT prophylaxis: Xarelto  HPI/Subjective: Feeling better than yesterday. Mild dry cough.    Objective: Blood pressure 107/54, pulse 83, temperature 97.7 F (36.5 C), temperature source Oral, resp. rate 22, height _0  (1.727 m), weight 92.5 kg (203 lb 14.8 oz), SpO2 85.00%.  Intake/Output Summary (Last 24 hours) at 07/19/12 2002 Last data filed at 07/19/12 1857  Gross per 24 hour  Intake   1160 ml  Output    850 ml  Net    310 ml     Exam: General: No acute respiratory distress Lungs: bibasilar crackles Cardiovascular: Regular rate and rhythm without murmur gallop or rub  normal S1 and S2 Abdomen: Nontender, nondistended, soft, bowel sounds positive, no rebound, no ascites, no appreciable mass Extremities: No significant cyanosis, clubbing, or edema bilateral lower extremities  Data Reviewed: Basic Metabolic Panel:  Recent Labs Lab 07/18/12 0811 07/19/12 0850  NA 142 139  K 4.0 4.2  CL 103 100  CO2 29 25  GLUCOSE 134* 168*  BUN 14 19  CREATININE 1.15 0.95  CALCIUM 8.5 8.3*   Liver Function Tests:  Recent Labs Lab 07/18/12 0811  AST 20  ALT 24  ALKPHOS 50  BILITOT 0.7  PROT 6.2  ALBUMIN 3.2*   No results found for this basename: LIPASE, AMYLASE,  in the last 168 hours No results found for this basename: AMMONIA,  in the last 168 hours CBC:  Recent Labs Lab 07/18/12 0811 07/19/12 0850  WBC 16.6* 17.1*  NEUTROABS 13.9*  --   HGB 16.4 15.5  HCT 50.3 46.8  MCV 98.4 96.1  PLT 121* 113*   Cardiac Enzymes:  Recent Labs Lab 07/18/12 1053 07/18/12 1540 07/18/12 2213  TROPONINI <0.30 <0.30 <0.30   BNP (last 3 results)  Recent Labs  11/13/11 1454 07/18/12 0812  PROBNP 185.0* 3784.0*   CBG: No results found for this basename: GLUCAP,  in the last 168 hours  Recent Results (from the past 240 hour(s))  CULTURE, BLOOD (ROUTINE X 2)     Status: None   Collection Time    07/18/12 10:55 AM      Result Value Range Status   Specimen Description BLOOD RIGHT ANTECUBITAL   Final   Special  Requests BOTTLES DRAWN AEROBIC AND ANAEROBIC 10CC   Final   Culture  Setup Time 07/18/2012 21:16   Final   Culture     Final   Value:        BLOOD CULTURE RECEIVED NO GROWTH TO DATE CULTURE WILL BE HELD FOR 5 DAYS BEFORE ISSUING A FINAL NEGATIVE REPORT   Report Status PENDING   Incomplete  CULTURE, BLOOD (ROUTINE X 2)     Status: None   Collection Time    07/18/12 11:00 AM      Result Value Range Status   Specimen Description BLOOD HAND LEFT   Final   Special Requests BOTTLES DRAWN AEROBIC ONLY 5CC   Final   Culture  Setup Time 07/18/2012  21:15   Final   Culture     Final   Value:        BLOOD CULTURE RECEIVED NO GROWTH TO DATE CULTURE WILL BE HELD FOR 5 DAYS BEFORE ISSUING A FINAL NEGATIVE REPORT   Report Status PENDING   Incomplete  MRSA PCR SCREENING     Status: None   Collection Time    07/18/12  1:40 PM      Result Value Range Status   MRSA by PCR NEGATIVE  NEGATIVE Final   Comment:            The GeneXpert MRSA Assay (FDA     approved for NASAL specimens     only), is one component of a     comprehensive MRSA colonization     surveillance program. It is not     intended to diagnose MRSA     infection nor to guide or     monitor treatment for     MRSA infections.     Studies:  Recent x-ray studies have been reviewed in detail by the Attending Physician  Scheduled Meds:  Scheduled Meds: . ambrisentan  5 mg Oral Daily  . ceFEPime (MAXIPIME) IV  1 g Intravenous Q8H  . guaiFENesin  1,200 mg Oral BID  . levofloxacin (LEVAQUIN) IV  750 mg Intravenous Q24H  . methylPREDNISolone (SOLU-MEDROL) injection  125 mg Intravenous Q6H  . Rivaroxaban  20 mg Oral Q supper  . simvastatin  20 mg Oral QHS  . tiotropium  18 mcg Inhalation Daily  . Treprostinil  18 mcg Inhalation Custom  . vancomycin  1,250 mg Intravenous Q12H   Continuous Infusions:   Time spent on care of this patient: 35 min   [place]  [phone] Pager - Text Page per Shea Evans as per below:  On-Call/Text Page:      [email or web address]      password [email or web address]  If 7PM-7AM, please contact night-coverage [email or web address] Password [email or web address] 07/19/2012, 8:02 PM   LOS: 1 day

## 2012-07-20 MED ORDER — FUROSEMIDE 10 MG/ML IJ SOLN
40.0000 mg | Freq: Three times a day (TID) | INTRAMUSCULAR | Status: AC
  Administered 2012-07-20 (×2): 40 mg via INTRAVENOUS
  Filled 2012-07-20 (×2): qty 4

## 2012-07-20 MED ORDER — AMBRISENTAN 5 MG PO TABS
10.0000 mg | ORAL_TABLET | Freq: Every day | ORAL | Status: DC
  Administered 2012-07-21 – 2012-07-27 (×7): 10 mg via ORAL
  Filled 2012-07-20 (×4): qty 2

## 2012-07-20 NOTE — Progress Notes (Signed)
Pt stated he did not tolerate CPAP when tried on previously. Pt on NRB with SpO2 greater than 88%.

## 2012-07-20 NOTE — Progress Notes (Signed)
PULMONARY  / CRITICAL CARE MEDICINE  Name: [name] MRN: [id number] DOB: February 09, 1951    ADMISSION DATE:  07/18/2012 CONSULTATION DATE:  3/31  REFERRING MD : [name]   CHIEF COMPLAINT: acute on chronic resp failure   BRIEF PATIENT DESCRIPTION:  This is a 62 year old male who is O2 dep (6-8L & Pred dep 40m) at baseline d/t psoriatic arthritis associated ILD w/ severe secondary PAH (on tyvazo and letaris) and related cor pulmonale. Admitted w/ acute on chronic resp failure on 3/31  SIGNIFICANT EVENTS / STUDIES:  Echo 31/54 Systolic function was normal. Moderate LVH.  The estimated ejection fraction was in the range of 60% to 65%.  LINES / TUBES:  CULTURES: U strep 3/31>>> negative U legionella 3/31>>> negative  ANTIBIOTICS: Cefepime 3/31>>> 4/2 vanc 3/31>>> 4/2 levaquin 3/31>>>  HISTORY OF PRESENT ILLNESS:    This is a 62year old male who is O2 dep (6-8L & Pred dep 171m at baseline d/t psoriatic arthritis associated ILD w/ severe secondary PAH and related cor pulmonale (on tyvazo and letaris) . At baseline can ambulate ~5097fefore onset of dyspnea. Presents to ER on 3/31 w/ 2d progressive dyspnea and chills. Denied change in cough, sick exposure, URI symptoms, wheezing, Chest pain or LE swelling. Admitted by IM service PCCM asked to see given concern for progressive resp failure.   SUBJECTIVE:  SOB w/ any exertion   VITAL SIGNS: Temp:  [97.2 F (36.2 C)-97.8 F (36.6 C)] 97.4 F (36.3 C) (04/02 0810) Pulse Rate:  [73-83] 73 (04/02 0810) Resp:  [15-22] 15 (04/02 0810) BP: (106-124)/(54-72) 124/67 mmHg (04/02 0810) SpO2:  [84 %-92 %] 92 % (04/02 0810) Weight:  [93.7 kg (206 lb 9.1 oz)-93.8 kg (206 lb 12.7 oz)] 93.8 kg (206 lb 12.7 oz) (04/02 0500)  Intake/Output Summary (Last 24 hours) at 07/20/12 1139 Last data filed at 07/20/12 0349  Gross per 24 hour  Intake   1050 ml  Output   1350 ml  Net   -300 ml    PHYSICAL EXAMINATION: General:  Awake, alert no  focal def  Neuro:  Awake alert  HEENT:  [place], no JVD  Cardiovascular:  rrr Lungs:  Bibasilar rales  Abdomen:  Soft, non-tender  Musculoskeletal:  Intact  Skin:  No sig edema    Recent Labs Lab 07/18/12 0811 07/19/12 0850  NA 142 139  K 4.0 4.2  CL 103 100  CO2 29 25  BUN 14 19  CREATININE 1.15 0.95  GLUCOSE 134* 168*    Recent Labs Lab 07/18/12 0811 07/19/12 0850  HGB 16.4 15.5  HCT 50.3 46.8  WBC 16.6* 17.1*  PLT 121* 113*   No results found.  ASSESSMENT / PLAN:  Acute on chronic respiratory failure. Oxygen dependent 6-8 liters at baseline  ILD associated w/ psoriatic arthritis: pred dependent  Severe secondary PAH w/ cor pulmonale: On Tyvaso 12 puffs q6h and letaris  COPD: no evidence of AECOPD, chronically on Spiriva Possible PNA Presume this is IPF flare/ acute pneumonitis, alternately consider infection/ CAP. Do not think this is d/t AECOPD and doubt heart failure, but there may be some element of volume excess given severe PAH and cor pulmonale as a contributing factor. Doubt PE as was on xarelto.   He had a DNR/ "yellow sheet" at home. Currently wants to "make it until May as he wants to see his grandson". Not sure this is a reasonable goal given his advanced lung disease. Will need to re-visit this,  until then we will do absolutely everything we can to try to prevent placing him on the vent as he is unlikely to come off after placed.  >did not tolerate CPAP  Plan/rec Empiric abx. Would consider change to levaquin alone on 4/2 and follow clinically  Systemic steroids-->for presumed IPF flare Home lasix dosing = 39m po qam, 442mpo qpm >> will dose daily for now, 4075mV now and again in 12h Scheduled BDs Continue Tyvaso, xarelto per home regimen. Will increase letaris to 79m86m and follow LFT Confirmed code status with pt today 4/2 >> no CPR, no intubation Thank you for caring for Derek McGiNiebuhrwould be happy to take him on my service if you would like.  Please call me if so.    Derek Apo, PhD 07/20/2012, 11:40 AM [facility] [phone] no answer [phone]

## 2012-07-20 NOTE — Progress Notes (Signed)
Pt. Refuses CPAP at this time. Pt. Is currently on 100% NRB & all vitals are within normal limits. Pt. Was made aware to call anytime during the night if he changed his mind & decided to wear CPAP.

## 2012-07-21 ENCOUNTER — Telehealth: Payer: Self-pay | Admitting: Emergency Medicine

## 2012-07-21 LAB — BASIC METABOLIC PANEL
BUN: 28 mg/dL — ABNORMAL HIGH (ref 6–23)
Chloride: 98 mEq/L (ref 96–112)
GFR calc Af Amer: 87 mL/min — ABNORMAL LOW (ref >90)
Potassium: 3.7 mEq/L (ref 3.5–5.1)
Sodium: 140 mEq/L (ref 135–145)

## 2012-07-21 LAB — GLUCOSE, CAPILLARY
Glucose-Capillary: 173 mg/dL — ABNORMAL HIGH (ref 70–99)
Glucose-Capillary: 278 mg/dL — ABNORMAL HIGH (ref 70–99)

## 2012-07-21 MED ORDER — FUROSEMIDE 10 MG/ML IJ SOLN: 40.0000 mg | Freq: Once | INTRAMUSCULAR | Status: DC

## 2012-07-21 MED ORDER — FUROSEMIDE 10 MG/ML IJ SOLN
40.0000 mg | Freq: Three times a day (TID) | INTRAMUSCULAR | Status: AC
  Administered 2012-07-21 (×2): 40 mg via INTRAVENOUS
  Filled 2012-07-21 (×2): qty 4

## 2012-07-21 MED ORDER — FUROSEMIDE 10 MG/ML IJ SOLN
40.0000 mg | Freq: Three times a day (TID) | INTRAMUSCULAR | Status: DC
Start: 2012-07-21 — End: 2012-07-21

## 2012-07-21 MED ORDER — LEVOFLOXACIN IN D5W 750 MG/150ML IV SOLN
750.0000 mg | INTRAVENOUS | Status: DC
  Administered 2012-07-21 – 2012-07-25 (×5): 750 mg via INTRAVENOUS
  Filled 2012-07-21 (×5): qty 150

## 2012-07-21 NOTE — Telephone Encounter (Signed)
PASP estimated at 85 mmHg

## 2012-07-21 NOTE — Progress Notes (Signed)
PULMONARY  / CRITICAL CARE MEDICINE  Name: [name] MRN: [id number] DOB: [birth year]    ADMISSION DATE:  07/18/2012 CONSULTATION DATE:  3/31  REFERRING MD : Cruzita Lederer   CHIEF COMPLAINT: acute on chronic resp failure   BRIEF PATIENT DESCRIPTION:  This is a 62 year old male who is O2 dep (6-8L & Pred dep 36m) at baseline d/t psoriatic arthritis associated ILD w/ severe secondary PAH (on tyvazo and letaris) and related cor pulmonale. Admitted w/ acute on chronic resp failure on 3/31  SIGNIFICANT EVENTS / STUDIES:  Echo 33/97 Systolic function was normal. Moderate LVH.  The estimated ejection fraction was in the range of 60% to 65%.  LINES / TUBES:  CULTURES: U strep 3/31>>> negative U legionella 3/31>>> negative  ANTIBIOTICS: Cefepime 3/31>>> 4/2 vanc 3/31>>> 4/2 levaquin 3/31>>>  HISTORY OF PRESENT ILLNESS:    This is a 62year old male who is O2 dep (6-8L & Pred dep 127m at baseline d/t psoriatic arthritis associated ILD w/ severe secondary PAH and related cor pulmonale (on tyvazo and letaris) . At baseline can ambulate ~5013fefore onset of dyspnea. Presents to ER on 3/31 w/ 2d progressive dyspnea and chills. Denied change in cough, sick exposure, URI symptoms, wheezing, Chest pain or LE swelling. Admitted by IM service PCCM asked to see given concern for progressive resp failure.   SUBJECTIVE:  SOB w/ any exertion  VITAL SIGNS: Temp:  [97.4 F (36.3 C)-97.6 F (36.4 C)] 97.4 F (36.3 C) (04/03 0730) Pulse Rate:  [54-83] 76 (04/03 0730) Resp:  [16-20] 18 (04/03 0730) BP: (104-138)/(55-107) 112/55 mmHg (04/03 0730) SpO2:  [82 %-96 %] 82 % (04/03 0921) FiO2 (%):  [40 %-50 %] 50 % (04/03 0730) Weight:  [91.3 kg (201 lb 4.5 oz)] 91.3 kg (201 lb 4.5 oz) (04/03 0423)  Intake/Output Summary (Last 24 hours) at 07/21/12 1049 Last data filed at 07/21/12 1033  Gross per 24 hour  Intake    720 ml  Output   3303 ml  Net  -2583 ml    PHYSICAL EXAMINATION: General:   Awake, alert no focal def  Neuro:  Awake alert  HEENT:  100% NRB, no JVD  Cardiovascular:  rrr Lungs:  Bibasilar rales  Abdomen:  Soft, non-tender  Musculoskeletal:  Intact  Skin:  No sig edema    Recent Labs Lab 07/18/12 0811 07/19/12 0850 07/21/12 0834  NA 142 139 140  K 4.0 4.2 3.7  CL 103 100 98  CO2 29 25 33*  BUN 14 19 28*  CREATININE 1.15 0.95 1.03  GLUCOSE 134* 168* 183*    Recent Labs Lab 07/18/12 0811  AST 20  ALT 24  ALKPHOS 50  BILITOT 0.7  PROT 6.2  ALBUMIN 3.2*  INR 1.46    Recent Labs Lab 07/18/12 0811 07/19/12 0850  HGB 16.4 15.5  HCT 50.3 46.8  WBC 16.6* 17.1*  PLT 121* 113*   No results found.  ASSESSMENT / PLAN:  Acute on chronic respiratory failure. Oxygen dependent 6-8 liters at baseline  ILD associated w/ psoriatic arthritis: pred dependent  Severe secondary PAH w/ cor pulmonale: On Tyvaso 12 puffs q6h and letaris  COPD: no evidence of AECOPD, chronically on Spiriva Possible PNA Presume this is IPF flare/ acute pneumonitis, alternately consider infection/ CAP. Do not think this is d/t AECOPD and doubt heart failure, but there may be some element of volume excess given severe PAH and cor pulmonale as a contributing factor. Doubt PE as was  on xarelto.   Plan/rec Empiric abx. Continue levaquin and follow clinically  Systemic steroids-->for presumed IPF flare Home lasix dosing = 64m po qam, 462mpo qpm >> will check BMP 4/3 AM and evaluate if more diuresis is safe at this time.  He is negative 3L for the past 24 hours. Believe he can tolerate another day of diuresis > lasix 80 mg IV x 2 then reassess am 4/4 Scheduled BDs Continue Tyvaso, xarelto per home regimen. Will increase letaris to 1027md and follow LFT Confirmed code status with pt 4/2 >> no CPR, no intubation    JerDola Factor-Student  RobBaltazar ApoD, PhD 07/21/2012, 10:49 AM [facility] [phone] if no answer [phone]

## 2012-07-21 NOTE — Telephone Encounter (Signed)
I spoke with Ivin Booty. She stated that pt's echo is in [facility] for review. Calling to let us know pt did not take his Letaris the morning of the echo but did take it the day before. Pt's PA pressures were elevated. Will forward to RB as an [place]

## 2012-07-21 NOTE — Telephone Encounter (Signed)
Thank you

## 2012-07-21 NOTE — Progress Notes (Signed)
Pt. Refuses CPAP at this time. Pt. Was made aware to call RT if he changed his mind anytime during the night & decided to wear CPAP.

## 2012-07-22 ENCOUNTER — Inpatient Hospital Stay (HOSPITAL_COMMUNITY): Payer: BC Managed Care – PPO

## 2012-07-22 LAB — COMPREHENSIVE METABOLIC PANEL
ALT: 37 U/L (ref 0–53)
AST: 22 U/L (ref 0–37)
Alkaline Phosphatase: 48 U/L (ref 39–117)
CO2: 32 mEq/L (ref 19–32)
Chloride: 96 mEq/L (ref 96–112)
GFR calc Af Amer: 84 mL/min — ABNORMAL LOW (ref >90)
GFR calc non Af Amer: 73 mL/min — ABNORMAL LOW (ref >90)
Glucose, Bld: 193 mg/dL — ABNORMAL HIGH (ref 70–99)
Potassium: 3.6 mEq/L (ref 3.5–5.1)
Sodium: 139 mEq/L (ref 135–145)
Total Bilirubin: 0.6 mg/dL (ref 0.3–1.2)

## 2012-07-22 LAB — GLUCOSE, CAPILLARY

## 2012-07-22 LAB — CBC
Hemoglobin: 15.7 g/dL (ref 13.0–17.0)
MCH: 32 pg (ref 26.0–34.0)
Platelets: 130 10*3/uL — ABNORMAL LOW (ref 150–400)
RBC: 4.91 MIL/uL (ref 4.22–5.81)
WBC: 10.8 10*3/uL — ABNORMAL HIGH (ref 4.0–10.5)

## 2012-07-22 MED ORDER — FUROSEMIDE 40 MG PO TABS
40.0000 mg | ORAL_TABLET | Freq: Every day | ORAL | Status: DC
  Administered 2012-07-22 – 2012-07-26 (×5): 40 mg via ORAL
  Filled 2012-07-22 (×6): qty 1

## 2012-07-22 MED ORDER — METHYLPREDNISOLONE SODIUM SUCC 125 MG IJ SOLR
125.0000 mg | Freq: Two times a day (BID) | INTRAMUSCULAR | Status: DC
  Administered 2012-07-22 – 2012-07-25 (×6): 125 mg via INTRAVENOUS
  Filled 2012-07-22 (×8): qty 2

## 2012-07-22 MED ORDER — POTASSIUM CHLORIDE CRYS ER 20 MEQ PO TBCR
40.0000 meq | EXTENDED_RELEASE_TABLET | Freq: Every day | ORAL | Status: DC
  Administered 2012-07-22 – 2012-07-27 (×6): 40 meq via ORAL
  Filled 2012-07-22 (×6): qty 2

## 2012-07-22 MED ORDER — FUROSEMIDE 80 MG PO TABS
80.0000 mg | ORAL_TABLET | Freq: Every day | ORAL | Status: DC
  Administered 2012-07-22 – 2012-07-27 (×6): 80 mg via ORAL
  Filled 2012-07-22 (×8): qty 1

## 2012-07-22 MED ORDER — POTASSIUM CHLORIDE CRYS ER 20 MEQ PO TBCR
40.0000 meq | EXTENDED_RELEASE_TABLET | Freq: Once | ORAL | Status: AC
  Administered 2012-07-22: 40 meq via ORAL
  Filled 2012-07-22: qty 2

## 2012-07-22 NOTE — Progress Notes (Signed)
Inpatient Diabetes Program Recommendations  AACE/ADA: New Consensus Statement on Inpatient Glycemic Control (2013)  Target Ranges:  Prepandial:   less than 140 mg/dL      Peak postprandial:   less than 180 mg/dL (1-2 hours)      Critically ill patients:  140 - 180 mg/dL   Reason for Visit: CBGs 4/3   [phone] mg/dl        4/4  181 mg/dl  Inpatient Diabetes Program Recommendations HgbA1C: Check HgbA1C for home blood glucose control.  Note: May need to consider starting Novolog SENSITIVE correction scale if CBGs continue greater than 180 mg/dl while in the hospital.

## 2012-07-22 NOTE — Progress Notes (Addendum)
PULMONARY  / CRITICAL CARE MEDICINE  Name: [name] MRN: [id number] DOB: July 29, 1950    ADMISSION DATE:  07/18/2012 CONSULTATION DATE:  3/31  REFERRING MD : Cruzita Lederer   CHIEF COMPLAINT: acute on chronic resp failure   BRIEF PATIENT DESCRIPTION:  This is a 62 year old male who is O2 dep (6-8L & Pred dep 50m) at baseline d/t psoriatic arthritis associated ILD w/ severe secondary PAH (on tyvazo and letaris) and related cor pulmonale. Admitted w/ acute on chronic resp failure on 3/31  SIGNIFICANT EVENTS / STUDIES:  Echo 34/03 Systolic function was normal. Moderate LVH.  The estimated ejection fraction was in the range of 60% to 65%.  LINES / TUBES:  CULTURES: U strep 3/31>>> negative U legionella 3/31>>> negative  ANTIBIOTICS: Cefepime 3/31>>> 4/2 vanc 3/31>>> 4/2 levaquin 3/31>>>  HISTORY OF PRESENT ILLNESS:    This is a 62year old male who is O2 dep (6-8L & Pred dep 142m at baseline d/t psoriatic arthritis associated ILD w/ severe secondary PAH and related cor pulmonale (on tyvazo and letaris) . At baseline can ambulate ~5070fefore onset of dyspnea. Presents to ER on 3/31 w/ 2d progressive dyspnea and chills. Denied change in cough, sick exposure, URI symptoms, wheezing, Chest pain or LE swelling. Admitted by IM service PCCM asked to see given concern for progressive resp failure.   SUBJECTIVE:  SOB w/ any exertion  VITAL SIGNS: Temp:  [97.5 F (36.4 C)-98.1 F (36.7 C)] 98.1 F (36.7 C) (04/04 0808) Pulse Rate:  [62-99] 75 (04/04 0808) Resp:  [14-22] 22 (04/04 0808) BP: (93-120)/(50-76) 120/74 mmHg (04/04 0808) SpO2:  [82 %-91 %] 88 % (04/04 0808) FiO2 (%):  [50 %] 50 % (04/04 0808) Weight:  [91.7 kg (202 lb 2.6 oz)] 91.7 kg (202 lb 2.6 oz) (04/04 0432)  Intake/Output Summary (Last 24 hours) at 07/22/12 0819 Last data filed at 07/21/12 2001  Gross per 24 hour  Intake    630 ml  Output   1900 ml  Net  -1270 ml    PHYSICAL EXAMINATION: General:  Awake,  alert no focal def  Neuro:  Awake alert  HEENT:  100% NRB, no JVD  Cardiovascular:  rrr Lungs:  Bibasilar rales  Abdomen:  Soft, non-tender  Musculoskeletal:  Intact  Skin:  No sig edema    Recent Labs Lab 07/19/12 0850 07/21/12 0834 07/22/12 0420  NA 139 140 139  K 4.2 3.7 3.6  CL 100 98 96  CO2 25 33* 32  BUN 19 28* 31*  CREATININE 0.95 1.03 1.06  GLUCOSE 168* 183* 193*    Recent Labs Lab 07/18/12 0811 07/22/12 0420  AST 20 22  ALT 24 37  ALKPHOS 50 48  BILITOT 0.7 0.6  PROT 6.2 6.0  ALBUMIN 3.2* 3.0*  INR 1.46  --     Recent Labs Lab 07/18/12 0811 07/19/12 0850 07/22/12 0420  HGB 16.4 15.5 15.7  HCT 50.3 46.8 47.6  WBC 16.6* 17.1* 10.8*  PLT 121* 113* 130*   Dg Chest Port 1 View  07/22/2012  *RADIOLOGY REPORT*  Clinical Data: Shortness of breath.  PORTABLE CHEST - 1 VIEW  Comparison: 07/18/2012 and 09/29/2010.  CT chest 09/29/2010.  Findings: Trachea is midline.  Heart is enlarged, stable.  There is pulmonary parenchymal coarsening, with somewhat of a peripheral and basilar distribution, not significantly changed from 09/29/2010. No definite pleural fluid.  IMPRESSION: Pulmonary parenchymal coarsening, with a peripheral and basilar predominance, not significantly changed from 09/29/2010.  Findings may be due to pulmonary fibrosis superimposed on emphysema. Further evaluation with high-resolution chest CT without contrast could be performed in further evaluation, as clinically indicated.   Original Report Authenticated By: Lorin Picket, M.D.     ASSESSMENT / PLAN:  Acute on chronic respiratory failure. Oxygen dependent 6-8 liters at baseline  ILD associated w/ psoriatic arthritis: pred dependent  Severe secondary PAH w/ cor pulmonale: On Tyvaso 12 puffs q6h and letaris  COPD: no evidence of AECOPD, chronically on Spiriva Possible PNA Presume this is IPF flare/ acute pneumonitis, alternately consider infection/ CAP. Do not think this is d/t AECOPD and doubt  heart failure, but there may be some element of volume excess given severe PAH and cor pulmonale as a contributing factor. Doubt PE as was on xarelto.   Plan/rec Empiric abx. Continue levaquin and follow clinically  Systemic steroids-->for presumed IPF flare, will start to taper Restart Home lasix dosing 4/4 = 76m po qam, 450mpo qpm Scheduled BDs Continue Tyvaso, xarelto per home regimen.  Letaris at 1064md.  Following  LFT (WNL 4/4) Confirmed code status with pt 4/2 >> no CPR, no intubation  Supp potassium     DavMerton BorderD ; PCCWomen & Infants Hospital Of Rhode Islandrvice Mobile ([phone] 5:30 PM or weekends, call [phone]

## 2012-07-23 LAB — CBC
HCT: 47 % (ref 39.0–52.0)
Hemoglobin: 16.1 g/dL (ref 13.0–17.0)
MCH: 32.7 pg (ref 26.0–34.0)
RBC: 4.93 MIL/uL (ref 4.22–5.81)

## 2012-07-23 LAB — PHOSPHORUS: Phosphorus: 4.7 mg/dL — ABNORMAL HIGH (ref 2.3–4.6)

## 2012-07-23 LAB — BASIC METABOLIC PANEL
Chloride: 98 mEq/L (ref 96–112)
GFR calc Af Amer: 85 mL/min — ABNORMAL LOW (ref >90)
GFR calc non Af Amer: 74 mL/min — ABNORMAL LOW (ref >90)
Glucose, Bld: 181 mg/dL — ABNORMAL HIGH (ref 70–99)
Potassium: 4.2 mEq/L (ref 3.5–5.1)
Sodium: 140 mEq/L (ref 135–145)

## 2012-07-23 LAB — MAGNESIUM: Magnesium: 2.8 mg/dL — ABNORMAL HIGH (ref 1.5–2.5)

## 2012-07-23 NOTE — Progress Notes (Signed)
PULMONARY  / CRITICAL CARE MEDICINE  Name: [name] MRN: [id number] DOB: [birth year]    ADMISSION DATE:  07/18/2012 CONSULTATION DATE:  3/31  REFERRING MD : Cruzita Lederer   CHIEF COMPLAINT: acute on chronic resp failure   BRIEF PATIENT DESCRIPTION:  This is a 62 year old male who is O2 dep (6-8L & Pred dep 66m) at baseline d/t psoriatic arthritis associated ILD w/ severe secondary PAH (on tyvazo and letaris) and related cor pulmonale. Admitted w/ acute on chronic resp failure on 3/31  SIGNIFICANT EVENTS / STUDIES:  Echo 36/65 Systolic function was normal. Moderate LVH.  The estimated ejection fraction was in the range of 60% to 65%.  LINES / TUBES:  CULTURES: U strep 3/31>>> negative U legionella 3/31>>> negative  ANTIBIOTICS: Cefepime 3/31>>> 4/2 vanc 3/31>>> 4/2 levaquin 3/31>>>  HISTORY OF PRESENT ILLNESS:    This is a 62year old male who is O2 dep (6-8L & Pred dep 171m at baseline d/t psoriatic arthritis associated ILD w/ severe secondary PAH and related cor pulmonale (on tyvazo and letaris) . At baseline can ambulate ~505fefore onset of dyspnea. Presents to ER on 3/31 w/ 2d progressive dyspnea and chills. Denied change in cough, sick exposure, URI symptoms, wheezing, Chest pain or LE swelling. Admitted by IM service PCCM asked to see given concern for progressive resp failure.   SUBJECTIVE:  SOB w/ any exertion. Expresses frustration that we are not able to turn his oxygen down. Says nobody "being honest and telling me anything". Says he would prefer to die at home. Denies acute pain, abdominal discomfort or pressure.  VITAL SIGNS: Temp:  [97.1 F (36.2 C)-97.8 F (36.6 C)] 97.8 F (36.6 C) (04/05 0747) Pulse Rate:  [65-85] 65 (04/05 0500) Resp:  [14-22] 14 (04/05 0500) BP: (105-121)/(55-79) 116/73 mmHg (04/05 0747) SpO2:  [83 %-92 %] 92 % (04/05 0500) Weight:  [90 kg (198 lb 6.6 oz)] 90 kg (198 lb 6.6 oz) (04/05 0500)  Intake/Output Summary (Last 24 hours) at  07/23/12 1043 Last data filed at 07/23/12 1000  Gross per 24 hour  Intake    990 ml  Output   4953 ml  Net  -3963 ml    PHYSICAL EXAMINATION: General:  Awake, alert no focal def. Intense, outspoken, agitated, oriented Neuro:  Awake alert  HEENT:  100% NRB, no JVD. Sats ranged 76-90 during conversation.  Cardiovascular:  rrr Lungs:  Bibasilar rales. Labored tachypnea, calming as he settles after bedside commode. Abdomen:  Soft, non-tender, male pattern protuberant.  Musculoskeletal:  Intact  Skin:  No sig edema. Dusky/ plethoric   Recent Labs Lab 07/21/12 0834 07/22/12 0420 07/23/12 0615  NA 140 139 140  K 3.7 3.6 4.2  CL 98 96 98  CO2 33* 32 34*  BUN 28* 31* 31*  CREATININE 1.03 1.06 1.05  GLUCOSE 183* 193* 181*    Recent Labs Lab 07/18/12 0811 07/22/12 0420  AST 20 22  ALT 24 37  ALKPHOS 50 48  BILITOT 0.7 0.6  PROT 6.2 6.0  ALBUMIN 3.2* 3.0*  INR 1.46  --     Recent Labs Lab 07/19/12 0850 07/22/12 0420 07/23/12 0615  HGB 15.5 15.7 16.1  HCT 46.8 47.6 47.0  WBC 17.1* 10.8* 10.4  PLT 113* 130* 114*   Dg Chest Port 1 View  07/22/2012  *RADIOLOGY REPORT*  Clinical Data: Shortness of breath.  PORTABLE CHEST - 1 VIEW  Comparison: 07/18/2012 and 09/29/2010.  CT chest 09/29/2010.  Findings: Trachea is  midline.  Heart is enlarged, stable.  There is pulmonary parenchymal coarsening, with somewhat of a peripheral and basilar distribution, not significantly changed from 09/29/2010. No definite pleural fluid.  IMPRESSION: Pulmonary parenchymal coarsening, with a peripheral and basilar predominance, not significantly changed from 09/29/2010.  Findings may be due to pulmonary fibrosis superimposed on emphysema. Further evaluation with high-resolution chest CT without contrast could be performed in further evaluation, as clinically indicated.   Original Report Authenticated By: Lorin Picket, M.D.     ASSESSMENT / PLAN:  Acute on chronic respiratory failure. Oxygen  dependent 6-8 liters at baseline. Compensatoiry polycythemia ILD associated w/ psoriatic arthritis: pred dependent  Severe secondary PAH w/ cor pulmonale: On Tyvaso 12 puffs q6h and letaris  COPD: no evidence of AECOPD, chronically on Spiriva Possible PNA Presume this is IPF flare/ acute pneumonitis, alternately consider infection/ CAP. Do not think this is d/t AECOPD and doubt heart failure, but there may be some element of volume excess given severe PAH and cor pulmonale as a contributing factor. Doubt PE as was on xarelto. He is frustrated, but also possible hypoxic encephalopathy.  Plan/rec Empiric abx. Continue levaquin and follow clinically  Systemic steroids-->for presumed IPF flare, will start to taper Restart Home lasix dosing 4/4 = 92m po qam, 429mpo qpm Scheduled BDs Continue Tyvaso, xarelto per home regimen.  Letaris at 1012md.  Following  LFT (WNL 4/4) Confirmed code status with pt 4/2 >> no CPR, no intubation  Supp potassium  -  4/5-We had some discussion of Hospice/ morphine and limits to our ability to treat this.  CD YouAnnamaria BootsD PCCM p [phone] 3:00 PM call [phone]

## 2012-07-24 ENCOUNTER — Encounter (HOSPITAL_COMMUNITY): Payer: Self-pay

## 2012-07-24 LAB — CULTURE, BLOOD (ROUTINE X 2): Culture: NO GROWTH

## 2012-07-24 LAB — GLUCOSE, CAPILLARY

## 2012-07-24 NOTE — Progress Notes (Addendum)
PULMONARY  / CRITICAL CARE MEDICINE  Name: [name] MRN: [id number] DOB: 1951/04/20    ADMISSION DATE:  07/18/2012 CONSULTATION DATE:  3/31  REFERRING MD : Cruzita Lederer   CHIEF COMPLAINT: acute on chronic resp failure   BRIEF PATIENT DESCRIPTION:  This is a 62 year old male who is O2 dep (6-8L & Pred dep 15m) at baseline d/t psoriatic arthritis associated ILD w/ severe secondary PAH (on tyvazo and letaris) and related cor pulmonale. Admitted w/ acute on chronic resp failure on 3/31  SIGNIFICANT EVENTS / STUDIES:  Echo 30/21 Systolic function was normal. Moderate LVH.  The estimated ejection fraction was in the range of 60% to 65%.  LINES / TUBES:  CULTURES: U strep 3/31>>> negative U legionella 3/31>>> negative  ANTIBIOTICS: Cefepime 3/31>>> 4/2 vanc 3/31>>> 4/2 levaquin 3/31>>>  SUBJECTIVE:  Frustrated that he is still on high FIO2.  VITAL SIGNS: Temp:  [97.1 F (36.2 C)-98 F (36.7 C)] 97.4 F (36.3 C) (04/06 0758) Pulse Rate:  [58-85] 63 (04/06 1100) Resp:  [13-25] 23 (04/06 0900) BP: (96-114)/(44-71) 102/67 mmHg (04/06 0800) SpO2:  [78 %-91 %] 90 % (04/06 1100) FiO2 (%):  [50 %] 50 % (04/06 0830) Weight:  [89 kg (196 lb 3.4 oz)] 89 kg (196 lb 3.4 oz) (04/06 0016)  Intake/Output Summary (Last 24 hours) at 07/24/12 1149 Last data filed at 07/24/12 0957  Gross per 24 hour  Intake   1310 ml  Output    200 ml  Net   1110 ml   50% venturi mask  PHYSICAL EXAMINATION: General:  Awake, alert no focal def. Calmer today, brusque Neuro:  Awake alert  HEENT:   no JVD.  Cardiovascular:  rrr Lungs:  Bibasilar rales. No accessory muscle use while upright in bed. Sat 92% Abdomen:  Soft, non-tender, male pattern protuberant.  Musculoskeletal:  Intact  Skin:  No sig edema. Dusky/ plethoric   Recent Labs Lab 07/21/12 0834 07/22/12 0420 07/23/12 0615  NA 140 139 140  K 3.7 3.6 4.2  CL 98 96 98  CO2 33* 32 34*  BUN 28* 31* 31*  CREATININE 1.03 1.06 1.05   GLUCOSE 183* 193* 181*    Recent Labs Lab 07/18/12 0811 07/22/12 0420  AST 20 22  ALT 24 37  ALKPHOS 50 48  BILITOT 0.7 0.6  PROT 6.2 6.0  ALBUMIN 3.2* 3.0*  INR 1.46  --     Recent Labs Lab 07/19/12 0850 07/22/12 0420 07/23/12 0615  HGB 15.5 15.7 16.1  HCT 46.8 47.6 47.0  WBC 17.1* 10.8* 10.4  PLT 113* 130* 114*   No results found.  ASSESSMENT / PLAN:  Acute on chronic respiratory failure. Oxygen dependent 6-8 liters at baseline. Compensatoiry polycythemia ILD associated w/ psoriatic arthritis: pred dependent  Severe secondary PAH w/ cor pulmonale: On Tyvaso 12 puffs q6h and letaris  COPD: no evidence of AECOPD, chronically on Spiriva Possible PNA Presume this is IPF flare/ acute pneumonitis, alternately consider infection/ CAP +/- volume excess given severe PAH and cor pulmonale as a contributing factor.    Plan/rec Empiric abx. Continue levaquin and follow clinically (will complete 8d total) Taper steroids  Restart Home lasix dosing 4/4 = 86mpo qam, 4071mo qpm Scheduled BDs Continue Tyvaso, xarelto per home regimen.  Letaris at 34m45m.  Following  LFT (WNL 4/4) Confirmed code status with pt 4/2 >> no CPR, no intubation  Will have wife bring in home oximizer.  He doesn't offer new needs, but  should have ongoing end of life discussion.   CD Annamaria Boots, MD PCCM p [phone] After 3:00 PM [phone]

## 2012-07-25 LAB — GLUCOSE, CAPILLARY
Glucose-Capillary: 247 mg/dL — ABNORMAL HIGH (ref 70–99)
Glucose-Capillary: 185 mg/dL — ABNORMAL HIGH (ref 70–99)
Glucose-Capillary: 320 mg/dL — ABNORMAL HIGH (ref 70–99)

## 2012-07-25 LAB — BASIC METABOLIC PANEL
BUN: 32 mg/dL — ABNORMAL HIGH (ref 6–23)
Chloride: 95 mEq/L — ABNORMAL LOW (ref 96–112)
Glucose, Bld: 193 mg/dL — ABNORMAL HIGH (ref 70–99)
Potassium: 4.1 mEq/L (ref 3.5–5.1)

## 2012-07-25 MED ORDER — INSULIN ASPART 100 UNIT/ML ~~LOC~~ SOLN
0.0000 [IU] | Freq: Every day | SUBCUTANEOUS | Status: DC
  Administered 2012-07-25 – 2012-07-26 (×2): 2 [IU] via SUBCUTANEOUS

## 2012-07-25 MED ORDER — PREDNISONE 50 MG PO TABS
60.0000 mg | ORAL_TABLET | Freq: Every day | ORAL | Status: DC
  Administered 2012-07-26 – 2012-07-27 (×2): 60 mg via ORAL
  Filled 2012-07-25 (×3): qty 1

## 2012-07-25 MED ORDER — INSULIN ASPART 100 UNIT/ML ~~LOC~~ SOLN
0.0000 [IU] | Freq: Three times a day (TID) | SUBCUTANEOUS | Status: DC
  Administered 2012-07-25: 7 [IU] via SUBCUTANEOUS
  Administered 2012-07-26: 3 [IU] via SUBCUTANEOUS
  Administered 2012-07-26: 1 [IU] via SUBCUTANEOUS

## 2012-07-25 NOTE — Progress Notes (Signed)
Charge RT was contacted by unit RT and was asked to talk to pt concerning pt home oxymizer. Charge RT went into patients room along with unit charge RN, Festus Holts, to explain we do not have the humidification that is compatible with his oxymizer. We explained to the patient he could bring his home equipment in for [place] to use but pt was very angry and stated "he did not want to bring his equipment in so we could charge him for it. This is a hospital and we are supposed to take care of this." RT will contact manager to speak with the patient and will continue to monitor.

## 2012-07-25 NOTE — Progress Notes (Signed)
Patient transferred from 2600. Oriented to room and call light.  O2 6L nasal canula on.  Will continue to monitor.

## 2012-07-25 NOTE — Progress Notes (Signed)
PULMONARY  / CRITICAL CARE MEDICINE  Name: [name] MRN: [id number] DOB: 03/30/1951    ADMISSION DATE:  07/18/2012 CONSULTATION DATE:  3/31  REFERRING MD : Cruzita Lederer   CHIEF COMPLAINT: acute on chronic resp failure   BRIEF PATIENT DESCRIPTION:  This is a 62 year old male who is O2 dep (6-8L & Pred dep 39m) at baseline d/t psoriatic arthritis associated ILD w/ severe secondary PAH (on tyvazo and letaris) and related cor pulmonale. Admitted w/ acute on chronic resp failure on 3/31  SIGNIFICANT EVENTS / STUDIES:  Echo 39/37 Systolic function was normal. Moderate LVH.  LVEF 60% to 65%.  LINES / TUBES:  CULTURES: U strep 3/31>>> negative U legionella 3/31>>> negative  ANTIBIOTICS: Cefepime 3/31>>> 4/2 vanc 3/31>>> 4/2 levaquin 3/31>> 4/7  SUBJECTIVE:  Very angry and frustrated. Strongly desires to go home  VITAL SIGNS: Temp:  [97 F (36.1 C)-98.3 F (36.8 C)] 97.7 F (36.5 C) (04/07 1211) Pulse Rate:  [57-102] 57 (04/07 1211) Resp:  [13-31] 19 (04/07 1211) BP: (100-128)/(56-71) 105/56 mmHg (04/07 1211) SpO2:  [82 %-95 %] 93 % (04/07 1211) FiO2 (%):  [50 %] 50 % (04/07 1211) Weight:  [191 lb 12.8 oz (87 kg)] 191 lb 12.8 oz (87 kg) (04/07 0012)  Intake/Output Summary (Last 24 hours) at 07/25/12 1424 Last data filed at 07/25/12 1347  Gross per 24 hour  Intake   1300 ml  Output   1675 ml  Net   -375 ml   50% venturi mask  PHYSICAL EXAMINATION: General:  NAD Neuro:  No focal HEENT:  WNL Cardiovascular:  RRR s M Lungs:  Bibasilar rales. No wheezes Abdomen:  Soft, non-tender, male pattern protuberant.  Musculoskeletal:  Intact  Skin:  No sig edema. Dusky/ plethoric   Recent Labs Lab 07/22/12 0420 07/23/12 0615 07/25/12 0525  NA 139 140 138  K 3.6 4.2 4.1  CL 96 98 95*  CO2 32 34* 36*  BUN 31* 31* 32*  CREATININE 1.06 1.05 0.95  GLUCOSE 193* 181* 193*    Recent Labs Lab 07/22/12 0420  AST 22  ALT 37  ALKPHOS 48  BILITOT 0.6  PROT 6.0   ALBUMIN 3.0*    Recent Labs Lab 07/19/12 0850 07/22/12 0420 07/23/12 0615  HGB 15.5 15.7 16.1  HCT 46.8 47.6 47.0  WBC 17.1* 10.8* 10.4  PLT 113* 130* 114*   No results found.  ASSESSMENT / PLAN:  Acute on chronic respiratory failure. Oxygen dependent 6-8 liters at baseline. Compensatoiry polycythemia ILD associated w/ psoriatic arthritis: pred dependent  Severe secondary PAH w/ cor pulmonale: On Tyvaso 12 puffs q6h and letaris  COPD: no evidence of AECOPD, chronically on Spiriva Possible PNA Presume this is IPF flare/ acute pneumonitis, alternately consider infection/ CAP +/- volume excess given severe PAH and cor pulmonale as a contributing factor.    Plan/rec Empiric abx. D/C Levofloxacin today - completed 8 days Taper steroids to PO pred Cont Home lasix dosing 4/4 = 865mpo qam, 4048mo qpm Cont scheduled BDs Continue Tyvaso, xarelto per home regimen.  Letaris at 68m40m.  Following  LFT (WNL 4/4) Confirmed code status with pt 4/2 >> no CPR, no intubation  Try to wean O2 to [place] and tolerate O2 sats as low as 80% as long as he is not symptomatic Transfer to med-surg  Possible D/C to home 4/8 if tolerates transition to [place] PCCM p [phone] 3:00 PM [phone]

## 2012-07-25 NOTE — Progress Notes (Signed)
Pt placed on home oxymizer per MD order.

## 2012-07-25 NOTE — Progress Notes (Signed)
Hyperglycemia; on steroids, possible steroid induced.   Plan: obtain Hga1c and initiate SSI.

## 2012-07-25 NOTE — Progress Notes (Signed)
RT spoke with pt about his home med and pt said he gives the tx to himself.

## 2012-07-25 NOTE — Progress Notes (Signed)
RT entered to check on pt due to unknown meds in work order.  Upon entering, pt was very blue.  RT checked pt's sats and it was 70%.  Rt placed pt on a venti mask at 40% which increased o2 to 80-84%.  Pt's color started to look better and Rn came in to speak with pt as well.  RT suggested a pulse oximetry in room with pt and RN said she would place the order.  While in room, pt asked RT to turn flow meter from 12 l to 13-14 l for the flow.  Sats maintained 83-84%.  Pt's chart recommends o2 sat 80-92%.  Pt was happy at this point and Rt left pt on a portable pulse ox until the bedside will be ordered.  Informed pt to let RN know if he needed assistance from RT at anytime in the night.

## 2012-07-26 ENCOUNTER — Inpatient Hospital Stay (HOSPITAL_COMMUNITY): Payer: BC Managed Care – PPO

## 2012-07-26 LAB — CBC
MCH: 32.3 pg (ref 26.0–34.0)
Platelets: 107 10*3/uL — ABNORMAL LOW (ref 150–400)
RBC: 5.08 MIL/uL (ref 4.22–5.81)
WBC: 9.9 10*3/uL (ref 4.0–10.5)

## 2012-07-26 LAB — GLUCOSE, CAPILLARY
Glucose-Capillary: 250 mg/dL — ABNORMAL HIGH (ref 70–99)
Glucose-Capillary: 213 mg/dL — ABNORMAL HIGH (ref 70–99)

## 2012-07-26 LAB — BASIC METABOLIC PANEL
Calcium: 8.7 mg/dL (ref 8.4–10.5)
GFR calc Af Amer: 82 mL/min — ABNORMAL LOW (ref >90)
GFR calc non Af Amer: 70 mL/min — ABNORMAL LOW (ref >90)
Sodium: 142 mEq/L (ref 135–145)

## 2012-07-26 LAB — HEMOGLOBIN A1C
Hgb A1c MFr Bld: 7.6 % — ABNORMAL HIGH (ref <5.7)
Mean Plasma Glucose: 171 mg/dL — ABNORMAL HIGH (ref <117)

## 2012-07-26 NOTE — Discharge Summary (Signed)
Physician Discharge Summary     Patient ID: [name] MRN: [id number] DOB/AGE: 1951-03-10 62 y.o.  Admit date: 07/18/2012 Discharge date: 07/27/2012  Admission Diagnoses: Acute on chronic resp failure  Discharge Diagnoses:  Principal Problem:   Acute and chronic respiratory failure Active Problems:   HYPERLIPIDEMIA   THROMBOCYTOPENIA   PULMONARY HYPERTENSION, SECONDARY   COR PULMONALE   C O P D   PULMONARY FIBROSIS   PSORIASIS   ARTHRITIS, RHEUMATOID   Hypoxemia   [facility] tests/ studies/ interventions and procedures  CULTURES:  U strep 3/31>>> negative  U legionella 3/31>>> negative  ANTIBIOTICS:  Cefepime 3/31>>> 4/2  vanc 3/31>>> 4/2  levaquin 3/31>> 4/7   This is a 62 year old male who is O2 dep (6-8L & Pred dep 45m) at baseline d/t psoriatic arthritis associated ILD w/ severe secondary PAH and related cor pulmonale (on tyvazo and letaris) . At baseline can ambulate ~511fbefore onset of dyspnea. Presents to ER on 3/31 w/ 2d progressive dyspnea and chills. Denied change in cough, sick exposure, URI symptoms, wheezing, Chest pain or LE selling. Admitted by IM service PCCM asked to see given concern for progressive resp failure.  Hospital Course:  Acute on chronic respiratory failure. Oxygen dependent 6-8 liters at baseline w/ Compensatory polycythemia. Was admitted initially to the SDU. Treated in a very broad spectrum manor with: NIPPV and alternating high FIO2 as he did not tolerate NIPPV, scheduled BDs, systemic steroids, empiric antibiotics and diuresis. His Tyvaso and Letaris were continued. He has made very slow progress but has not returned fully to baseline. He will be discharged to home on high FIO2, pred taper and close out-pt follow up.   ILD associated w/ psoriatic arthritis: pred dependent  Severe secondary PAH w/ cor pulmonale: On Tyvaso 12 puffs q6h and letaris . These were all continued.   COPD: no evidence of AECOPD,  chronically on Spiriva   Possible PNA  Presume this is IPF flare/ acute pneumonitis, alternately consider infection/ CAP +/- volume excess given severe PAH and cor pulmonale as a contributing factor.  >completed 8d levaquin   Plan/rec  Slow pred taper  Cont Home lasix dosing 4/4 = 8010mo qam, 75m53m qpm  Cont scheduled BDs  Continue Tyvaso, xarelto per home regimen. Letaris at 10mg64m Following LFT (WNL 4/4)  Confirmed code status with pt 4/2 >> no CPR, no intubation  Try to wean O2 to [place] and tolerate O2 sats as low as 80% as long as he is not symptomatic  D/c to home 4/9     Discharge Exam: BP 101/49  Pulse 66  Temp(Src) 97.3 F (36.3 C) (Oral)  Resp 20  Ht _0  (1.727 m)  Wt 87 kg (191 lb 12.8 oz)  BMI 29.17 kg/m2  SpO2 91% 7 liters oximizer    PHYSICAL EXAMINATION:  General: NAD  Neuro: No focal  HEENT: WNL  Cardiovascular: RRR s M  Lungs: Bibasilar rales. No wheezes  Abdomen: Soft, non-tender, male pattern protuberant.  Musculoskeletal: Intact  Skin: No sig edema. Dusky/ plethoric   Labs at discharge Lab Results  Component Value Date   CREATININE 1.09 07/26/2012   BUN 35* 07/26/2012   NA 142 07/26/2012   K 3.9 07/26/2012   CL 97 07/26/2012   CO2 38* 07/26/2012   Lab Results  Component Value Date   WBC 9.9 07/26/2012   HGB 16.4 07/26/2012   HCT 48.3 07/26/2012   MCV 95.1 07/26/2012  PLT 107* 07/26/2012   Lab Results  Component Value Date   ALT 37 07/22/2012   AST 22 07/22/2012   ALKPHOS 48 07/22/2012   BILITOT 0.6 07/22/2012   Lab Results  Component Value Date   INR 1.46 07/18/2012   INR 2.35* 10/03/2010   INR 2.99* 10/02/2010    Current radiology studies Dg Chest Port 1 View  07/26/2012  *RADIOLOGY REPORT*  Clinical Data: Shortness of breath.  Respiratory failure.  PORTABLE CHEST - 1 VIEW  Comparison: Chest x-ray 07/22/2012.  Findings: Lung volumes are low.  There is diffuse interstitial prominence throughout the periphery of the lungs bilaterally, most severe in the  lung bases.  Additional focal airspace consolidation is noted in the periphery of the left base obscuring the left cardiac border, compatible with lingular air space consolidation. Possible small bilateral pleural effusions.  Cephalization of the pulmonary vasculature.  Heart size is mildly enlarged.  Mediastinal contours appears slightly widened, but are likely accentuated by patient positioning.  Atherosclerosis in the thoracic aorta.  IMPRESSION: 1.  Given the cephalization of the pulmonary vasculature and indistinctness of interstitial markings, there is likely some component of underlying pulmonary edema, presumably from congestive heart failure given the presence of cardiomegaly. 2.  In addition, there is an area of airspace consolidation in the lingula concerning for acute infection. 3.  When compared to numerous prior examinations, there appears to be some baseline of low lung volumes and peripheral interstitial prominence, suggesting a background of interstitial lung disease. 4.  Atherosclerosis.   Original Report Authenticated By: Vinnie Langton, M.D.     Disposition:  06-Home-Health Care Svc      Discharge Orders   Future Appointments Provider Department Dept Phone   08/01/2012 4:00 PM Melvenia Needles, NP [facility] [phone]   08/08/2012 3:00 PM Collene Gobble, MD [facility] [phone]   Future Orders Complete By Expires     (HEART FAILURE PATIENTS) Call MD:  Anytime you have any of the following symptoms: 1) 3 pound weight gain in 24 hours or 5 pounds in 1 week 2) shortness of breath, with or without a dry hacking cough 3) swelling in the hands, feet or stomach 4) if you have to sleep on extra pillows at night in order to breathe.  As directed     Call MD for:  difficulty breathing, headache or visual disturbances  As directed     Call MD for:  extreme fatigue  As directed     Call MD for:  temperature >100.4  As directed     Diet - low sodium heart healthy   As directed     For home use only DME oxygen  As directed     Comments:      7-8 liters via oximizer. May transition to simple face mask as needed for dyspnea.    Questions:      Mode or (Route):  Nasal cannula    Liters per Minute:      Frequency:      Oxygen conserving device:      Increase activity slowly  As directed         Medication List    TAKE these medications       albuterol (2.5 MG/3ML) 0.083% nebulizer solution  Commonly known as:  PROVENTIL  Take 3 mLs (2.5 mg total) by nebulization every 6 (six) hours as needed for wheezing.     ambrisentan 5 MG tablet  Commonly known as:  LETAIRIS  Take 5 mg by mouth daily.     b complex vitamins tablet  Take 1 tablet by mouth daily.     calcium-vitamin D 250-125 MG-UNIT per tablet  Commonly known as:  OSCAL  Take 1 tablet by mouth 2 (two) times daily.     cetirizine 10 MG tablet  Commonly known as:  ZYRTEC  Take 10 mg by mouth daily.     folic acid 1 MG tablet  Commonly known as:  FOLVITE  Take 1 mg by mouth daily.     furosemide 40 MG tablet  Commonly known as:  LASIX  Take 40-80 mg by mouth 2 (two) times daily. 80 mg every morning and 40 mg every night     guaiFENesin 600 MG 12 hr tablet  Commonly known as:  MUCINEX  Take 1,200 mg by mouth 2 (two) times daily.     HUMIRA 40 MG/0.8ML injection  Generic drug:  adalimumab  Inject 40 mg into the skin every 14 (fourteen) days.     multivitamin capsule  Take 1 capsule by mouth daily.     pantoprazole 40 MG tablet  Commonly known as:  PROTONIX  Take 40 mg by mouth daily.     potassium chloride SA 20 MEQ tablet  Commonly known as:  K-DUR,KLOR-CON  Take 40 mEq by mouth 2 (two) times daily.     predniSONE 10 MG tablet  Commonly known as:  DELTASONE  Take 1 tablet (10 mg total) by mouth daily.     predniSONE 10 MG tablet  Commonly known as:  DELTASONE  Take 4 tabs  daily with food x 4 days, then 3 tabs daily x 4 days, then 2 tabs daily x 4 days, then 1 tab  daily x4 days then stop. #40     simvastatin 20 MG tablet  Commonly known as:  ZOCOR  Take 20 mg by mouth at bedtime.     TACLONEX EX  Apply 1 application topically daily.     thiamine 100 MG tablet  Take 100 mg by mouth daily.     tiotropium 18 MCG inhalation capsule  Commonly known as:  SPIRIVA  Place 1 capsule (18 mcg total) into inhaler and inhale daily.     TYLENOL ARTHRITIS PAIN 650 MG CR tablet  Generic drug:  acetaminophen  Take 1,300 mg by mouth 2 (two) times daily.     TYVASO IN  Inhale 12 puffs into the lungs 4 (four) times daily.     XARELTO 20 MG Tabs  Generic drug:  Rivaroxaban  Take 20 mg by mouth daily. 1 by mouth daily       Follow-up Information   Follow up with PARRETT,TAMMY, NP On 08/01/2012. (4pm )    Contact information:   [address] [address] [phone]       Follow up with Collene Gobble., MD On 08/08/2012. (3pm )    Contact information:   [address] [address] [phone]       Discharged Condition: poor, but stable. Has met maximum benefit from in-patient care.   Physician Statement:   The Patient was personally examined, the discharge assessment and plan has been personally reviewed and I agree with ACNP Babcock's assessment and plan. > 30 minutes of time have been dedicated to discharge assessment, planning and discharge instructions.   SignedMarni Griffon 07/27/2012, 10:00 AM  Baltazar Apo, MD, PhD 07/27/2012, 3:11 PM  Pulmonary and Critical Care [phone] or  if no answer [phone]

## 2012-07-26 NOTE — Progress Notes (Signed)
PULMONARY  / CRITICAL CARE MEDICINE  Name: [name] MRN: [id number] DOB: 14-Sep-1950    ADMISSION DATE:  07/18/2012 CONSULTATION DATE:  3/31  REFERRING MD : Cruzita Lederer   CHIEF COMPLAINT: acute on chronic resp failure   BRIEF PATIENT DESCRIPTION:  This is a 62 year old male who is O2 dep (6-8L & Pred dep 17m) at baseline d/t psoriatic arthritis associated ILD w/ severe secondary PAH (on tyvazo and letaris) and related cor pulmonale. Admitted w/ acute on chronic resp failure on 3/31  SIGNIFICANT EVENTS / STUDIES:  Echo 35/72 Systolic function was normal. Moderate LVH.  LVEF 60% to 65%.  LINES / TUBES:  CULTURES: U strep 3/31>>> negative U legionella 3/31>>> negative  ANTIBIOTICS: Cefepime 3/31>>> 4/2 vanc 3/31>>> 4/2 levaquin 3/31>> 4/7  SUBJECTIVE:  Very angry and frustrated. Strongly desires to go home  VITAL SIGNS: Temp:  [97.3 F (36.3 C)-97.8 F (36.6 C)] 97.8 F (36.6 C) (04/08 0939) Pulse Rate:  [57-85] 72 (04/08 0939) Resp:  [18-23] 18 (04/08 0939) BP: (105-129)/(53-94) 120/65 mmHg (04/08 0939) SpO2:  [84 %-96 %] 96 % (04/08 0939) FiO2 (%):  [50 %] 50 % (04/07 1211)  Intake/Output Summary (Last 24 hours) at 07/26/12 1032 Last data filed at 07/26/12 0800  Gross per 24 hour  Intake    580 ml  Output   1400 ml  Net   -820 ml   7 liter oximizer.   PHYSICAL EXAMINATION: General:  NAD Neuro:  No focal HEENT:  WNL Cardiovascular:  RRR s M Lungs:  Bibasilar rales. No wheezes Abdomen:  Soft, non-tender, male pattern protuberant.  Musculoskeletal:  Intact  Skin:  No sig edema. Dusky/ plethoric   Recent Labs Lab 07/23/12 0615 07/25/12 0525 07/26/12 0636  NA 140 138 142  K 4.2 4.1 3.9  CL 98 95* 97  CO2 34* 36* 38*  BUN 31* 32* 35*  CREATININE 1.05 0.95 1.09  GLUCOSE 181* 193* 121*    Recent Labs Lab 07/22/12 0420  AST 22  ALT 37  ALKPHOS 48  BILITOT 0.6  PROT 6.0  ALBUMIN 3.0*    Recent Labs Lab 07/22/12 0420 07/23/12 0615  07/26/12 0636  HGB 15.7 16.1 16.4  HCT 47.6 47.0 48.3  WBC 10.8* 10.4 9.9  PLT 130* 114* 107*   Dg Chest Port 1 View  07/26/2012  *RADIOLOGY REPORT*  Clinical Data: Shortness of breath.  Respiratory failure.  PORTABLE CHEST - 1 VIEW  Comparison: Chest x-ray 07/22/2012.  Findings: Lung volumes are low.  There is diffuse interstitial prominence throughout the periphery of the lungs bilaterally, most severe in the lung bases.  Additional focal airspace consolidation is noted in the periphery of the left base obscuring the left cardiac border, compatible with lingular air space consolidation. Possible small bilateral pleural effusions.  Cephalization of the pulmonary vasculature.  Heart size is mildly enlarged.  Mediastinal contours appears slightly widened, but are likely accentuated by patient positioning.  Atherosclerosis in the thoracic aorta.  IMPRESSION: 1.  Given the cephalization of the pulmonary vasculature and indistinctness of interstitial markings, there is likely some component of underlying pulmonary edema, presumably from congestive heart failure given the presence of cardiomegaly. 2.  In addition, there is an area of airspace consolidation in the lingula concerning for acute infection. 3.  When compared to numerous prior examinations, there appears to be some baseline of low lung volumes and peripheral interstitial prominence, suggesting a background of interstitial lung disease. 4.  Atherosclerosis.   Original  Report Authenticated By: Vinnie Langton, M.D.     ASSESSMENT / PLAN:  Acute on chronic respiratory failure. Oxygen dependent 6-8 liters at baseline. Compensatory polycythemia--->seems to be ast baseline  ILD associated w/ psoriatic arthritis: pred dependent  Severe secondary PAH w/ cor pulmonale: On Tyvaso 12 puffs q6h and letaris  COPD: no evidence of AECOPD, chronically on Spiriva Possible PNA Presume this is IPF flare/ acute pneumonitis, alternately consider infection/ CAP +/-  volume excess given severe PAH and cor pulmonale as a contributing factor.  >completed 8d levaquin   Plan/rec Taper steroids to PO pred Cont Home lasix dosing 4/4 = 9m po qam, 464mpo qpm Cont scheduled BDs Continue Tyvaso, xarelto per home regimen.  Letaris at 1043md.  Following  LFT (WNL 4/4) Confirmed code status with pt 4/2 >> no CPR, no intubation  Try to wean O2 to [place] and tolerate O2 sats as low as 80% as long as he is not symptomatic D/c to home 4/9  RobBaltazar ApoD, PhD 07/26/2012, 3:56 PM [facility] [phone] if no answer [phone]

## 2012-07-26 NOTE — Progress Notes (Signed)
Inpatient Diabetes Program Recommendations  AACE/ADA: New Consensus Statement on Inpatient Glycemic Control (2013)  Target Ranges:  Prepandial:   less than 140 mg/dL      Peak postprandial:   less than 180 mg/dL (1-2 hours)      Critically ill patients:  140 - 180 mg/dL   Reason for Visit: Results for [name], [name] (MRN [id number]) as of 07/26/2012 11:49  Ref. Range 07/25/2012 12:17 07/25/2012 17:12 07/25/2012 21:48 07/26/2012 07:35  Glucose-Capillary Latest Range: 70-99 mg/dL 265 (H) 320 (H) 202 (H) 119 (H)   Note CBG's elevated with steroids.  A1C=7.6% however there is no prior history of diabetes noted.  Will need follow-up regarding high A1C with PCP.  Also consider adding Novolog meal coverage 4 units tid with meals-Hold if patient eats less than 50%.

## 2012-07-27 DIAGNOSIS — L408 Other psoriasis: Secondary | ICD-10-CM

## 2012-07-27 MED ORDER — PREDNISONE 10 MG PO TABS: ORAL_TABLET | ORAL | Status: DC

## 2012-07-27 MED ORDER — PREDNISONE 10 MG PO TABS: 10.0000 mg | ORAL_TABLET | Freq: Every day | ORAL | Status: DC

## 2012-07-27 NOTE — Progress Notes (Signed)
Pt and wife given discharge instructions and follow-up visit information.  They verbalized understanding of all instructions.  Pt already has home O2 and everything else that he needs.  Pt taken down for discharge via wheelchair.

## 2012-08-01 ENCOUNTER — Ambulatory Visit (INDEPENDENT_AMBULATORY_CARE_PROVIDER_SITE_OTHER): Payer: BC Managed Care – PPO | Admitting: Adult Health

## 2012-08-01 ENCOUNTER — Encounter: Payer: Self-pay | Admitting: Adult Health

## 2012-08-01 VITALS — BP 110/60 | HR 90 | Temp 96.7°F | Ht 68.0 in | Wt 200.6 lb

## 2012-08-01 DIAGNOSIS — I2789 Other specified pulmonary heart diseases: Secondary | ICD-10-CM

## 2012-08-01 DIAGNOSIS — J449 Chronic obstructive pulmonary disease, unspecified: Secondary | ICD-10-CM

## 2012-08-01 MED ORDER — AMBRISENTAN 10 MG PO TABS: 10.0000 mg | ORAL_TABLET | Freq: Every day | ORAL | Status: DC

## 2012-08-01 NOTE — Patient Instructions (Addendum)
Taper prednisone as directed down to 66m daily and hold at this dose.  Legs elevated , low salt diet.  May take extra 471mof Lasix this afternoon x 1 .  Increase Letaris 1043maily as directed.  Follow up Dr. ByrLamonte Sakaiext week as planned.  Please contact office for sooner follow up if symptoms do not improve or worsen or seek emergency care

## 2012-08-02 ENCOUNTER — Telehealth: Payer: Self-pay | Admitting: Emergency Medicine

## 2012-08-02 NOTE — Progress Notes (Signed)
Subjective:    Patient ID: [name], male    DOB: 05-Jul-1950, 62 y.o.   MRN: [id number] HPI 62 yo man, former smoker with COPD, hx psoriatic arthritis and associated ILD, profound hypoxemia and associated Pulm HTN (PAP 57/26 mean 37, PAOP 14/11 mean 7 by cath 10/04/09. 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 Admitted 6/21-27 with resp failure and decompensated R heart failure. He was diuresed, treated with corticosteroids and started on bronchodilators for his COPD. O2 was titrated to 5L/min at rest, he is leaving it on 5 with exertion.   ROV 02/05/10 -- 62yo, PAH with Hx psoriatic arthritis, ILD and R heart failure, hypoxemia, on chronic pred 76m once daily. Also hx COPD.  On letaris since September '11. Since last time breathing may be a bit better. He has good days and bad days. Rarely uses ventolin. No HA, no syncope or near syncope. LE edema has been well controlled and stable. Was desaturated while walking today on presentation. Has been gaining some wt, aldactone was decreased by Dr SElisabeth Caraa month ago. PFTs done today.   ROV 04/16/10 -- Hx as above, follows up after TTE and 6 min walk. Returns feeling a bit more SOB w exertion. 6 min walk distance = 1665[place]oc with significant desat to 75% (after 2 min). Estimated RVSP 7954m(up from 59 in 09/2009). Has been on letaris since September. Edema seems better on lasix 63m35mo times a day. Clearly both TTE and walk distance are worse.   ROV 05/19/10 -- returns for f/u of the above issues. last time we started Tyvaso in addition to his letaris. Has been on it for a month. Up to 9 puffs 4x a day. Has been assoc with some HA, occas cough right after doing it, not intrusive. On Pred 10mg12miriva + ventoilin as needed. He is not noticing any big changes in his breathing yet on the Tyvaso. Monthly LFT's normal 1/17.   ROV 07/29/10 -- COPD, psoriatic arthritis + ILD, PAH. Regimen is Tyvaso + Letaris. [facility] They have told him he needs screening CSY, EtOH and tobacco random screening, counseling. Tells me today that his breathing is stable, although he has had trouble w allergies and w the pollen. Taking spiriva + SABA. O2 is set on 6L/min via oximizer.   ROV 09/23/10 -- COPD, psoriatic arthritis + ILD, PAH. He is feeling well, has been doing pulm rehab. Has been maintained on Tyvaso, Letaris. [facility] >> cont on current regimen  POST HOSP 10/10/10  Pt returns for post hospital follow up  Pt was admitted   6/11-6/15 for acute on chronic resp failure w/ underlying PF/RA/PAH.  CT chest was neg for PE, chronic changes along with diffuse ASPDZ -?alveolitis vs pulm edema. BNP >2000 . At discharge BNP 548.  He did receive increased steroids and diuresis along pulmonary hygiene.   Since discharge he is better but still remains weak and wears out easily.  Ankles are still puffy esp in the evening. He says he is off his spironolactone but unclear why-no mention in discharge summary why this was held   Today BMET shows K+ 3.7    ROV 10/24/10 -- COPD, hx psoriatic arthritis and associated ILD, profound hypoxemia and associated Pulm HTN (PAP 57/26 mean 37, PAOP 14/11 mean 7 by cath 10/04/09. 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 Admitted  6/11 - 15, still quite limited, not back to full speed.  Biggest complaint is exertional dyspnea and hypoxemia - to 60's on oximizer 6L/min. He continues to have exertional hypoxemia.   ROV 01/20/11 -- COPD, hx psoriatic arthritis (Dr Tonia Brooms) and associated ILD, profound hypoxemia and associated Pulm HTN (PAP 57/26 mean 37, PAOP 14/11 mean 7 by cath 10/04/09). 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 We had him evaluated for possible transplant, turned down at [facility]. Using 5-6L/min via oximizer at rest, up to 8-10L/min when moving. Using Spiriva and albuterol prn. Continues to have exertional SOB, may be worse. He is coughing more,  has nasal congestion last few days. Taking zyrtec. He is using some saline. Has humidity in his O2 concentrator. Still in the [facility] pulm rehab maintenance program.    ROV 05/18/11 -- COPD, hx psoriatic arthritis (Dr Tonia Brooms) and associated ILD, profound hypoxemia and associated Pulm HTN (PAP 57/26 mean 37, PAOP 14/11 mean 7 by cath 10/04/09). 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 Cough well controlled. He is on Letaris + Tyvaso. He had R heart cath at [facility] in Spring 2012, I don't have data. Needs 6 minute walk.   ROV 08/20/11 -- COPD, hx psoriatic arthritis (Dr Tonia Brooms) and associated ILD, profound hypoxemia and associated Pulm HTN (PAP 57/26 mean 37, PAOP 14/11 mean 7 by cath 10/04/09). 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  6 min walk distance 146mtoday. 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 Couldn't walk as far today, feels more dyspneic overall. Using 6L/min per oximizer. [facility] + xarelto. On Spiriva + albuterol. Pt wants me to stop his aldactone and increase lasix, doesn't like the aldactone because of gynecomastia.   ROV 10/05/11 -- COPD, hx psoriatic arthritis (Dr GTonia Brooms and associated ILD, profound hypoxemia and associated Pulm HTN (PAP 57/26 mean 37, PAOP 14/11 mean 7 by cath 10/04/09). 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 Has spoken with CAldrichabout increasing his Tyvaso to 12 puffs qid. I did not do this because I don't know of any studies to support. Still occasionally does the rehab maintenance program. LFT done today.   ROV 11/25/11 -- COPD, hx psoriatic arthritis (Dr GTonia Brooms and associated ILD, profound hypoxemia and associated Pulm HTN (PAP 57/26 mean 37, PAOP 14/11 mean 7 by cath 10/04/09). 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 Tyvaso increased to 12 puffs 4x a day in 6/13 but not sure if it has helped, remains on xarelto and letaris 5. After wt gain of 10 lbs and more SOB, lasix  temporarily increase to 856mbid, then back down to 4026mid. He felt that his breathing was better when he was < 190 lbs, currently at 194lbs.   ROV 12/25/11 -- COPD, hx psoriatic arthritis (Dr GruTonia Broomsnd associated ILD, profound hypoxemia and associated Pulm HTN. On Letaris + Tyvaso. He remains hypoxic with exertion, even on oximizer.  Last time we continued 12 puffs tyvaso. He feels stable, able to exert some. Since last time [facility] in coming months. Wants the flu shot.   ROV 03/11/12 -- COPD, hx psoriatic arthritis (Dr GruTonia Broomsnd associated ILD, profound hypoxemia and associated Pulm HTN. On Letaris + Tyvaso 12 puffs. He remains hypoxic with exertion, even  on oximizer. Due for 6 minute walk in March. He has been having trouble with nose dryness, bleeding, due to high flow O2. He has tried nasal saline, saline gel, etc. Still very problematic.  His breathing has been fairly stable.   ROV 05/17/12 -- COPD, hx psoriatic arthritis (Dr Tonia Brooms) and associated ILD, profound hypoxemia and associated Pulm HTN. On Letaris + Tyvaso 12 puffs. Has been very difficult to keep him oxygenated, he is having progressive dyspnea. Has been seen at [facility] for transplant but not for [place]. He has more SOB, now unable to walk up the stairs - has started sleeping downstairs/. He uses albuterol tid (an increase) since last month.   ROV 06/22/12 -- COPD, hx psoriatic arthritis (Dr Tonia Brooms) and associated ILD, profound hypoxemia and associated Pulm HTN. On Letaris + Tyvaso 12 puffs.  He is profoundly hypoxemic and I suspect he needs IV therapy. He is still having problems with epistaxis, has seen ENT - due to his high flow O2. Last time we increased pred to 59m empirically - he may be recovering more quickly after exertion. Last TTE PASP was 50-60, 12/23/11.   [facility]  [facility] 3/31-4/9 for acute resp failure w/ bronchitis, superimposed on ILD associated w/ PAH w/  decompensated cor pulmonale .  Tx w/ abx, steroids , diuretics.  Since discharge he is feeling some better. But still weak no energy.  Some ankle edema worse in evening.  No hemoptysis, chest pain , fever or n/v.  Letaris was increased during admission , needs rx sent to pharmacy.  Currenlty on tapering dose of steorids, baseline is 150mdaily   ROS Neg except HPI    Objective:   Physical Exam  Gen: Pleasant, hyperpigmented face, no distress  ENT: No lesions,  mouth clear,  oropharynx clear, no postnasal drip, throat clearing. Nasal mucosa red and swollen bilaterally  Neck: No JVD, no TMG, no carotid bruits  Lungs: No use of accessory muscles, Bibasilar insp crackles. No wheeze  Cardiovascular: RRR, heart sounds normal, no murmur or gallops, trace pretibial edema  Musculoskeletal: No deformities, no cyanosis or clubbing  Neuro: alert, non focal  Skin: Warm, no lesions or rashes, hyperpigmented face psoriatic patches along arms and legs    Assessment & Plan:

## 2012-08-02 NOTE — Assessment & Plan Note (Signed)
Recent flare with cor pulmonale decompensation -now slowly improving   Plan  Taper prednisone as directed down to 74m daily and hold at this dose.  Legs elevated , low salt diet.  May take extra 460mof Lasix this afternoon x 1 .  Increase Letaris 1020maily as directed.  Follow up Dr. ByrLamonte Sakaiext week as planned.  Please contact office for sooner follow up if symptoms do not improve or worsen or seek emergency care

## 2012-08-02 NOTE — Telephone Encounter (Signed)
Spoke with pharmacist at [facility], states Rx was not received for [name] although our records indicate this Rx was sent 08/01/12. Rx given verbally to pharmacist and nothing further needed at this time.  Per TP: 08/01/12 Patient Instructions    Taper prednisone as directed down to 40m daily and hold at this dose.  Legs elevated , low salt diet.  May take extra 466mof Lasix this afternoon x 1 .  Increase Letaris 1081maily as directed.  Follow up Dr. ByrLamonte Sakaixt week as planned.  Please contact office for sooner follow up if symptoms do not improve or worsen or seek emergency care

## 2012-08-02 NOTE — Assessment & Plan Note (Signed)
Taper prednisone as directed down to 66m daily and hold at this dose.  Legs elevated , low salt diet.  May take extra 471mof Lasix this afternoon x 1 .  Increase Letaris 1043maily as directed.  Follow up Dr. ByrLamonte Sakaiext week as planned.  Please contact office for sooner follow up if symptoms do not improve or worsen or seek emergency care

## 2012-08-08 ENCOUNTER — Ambulatory Visit (INDEPENDENT_AMBULATORY_CARE_PROVIDER_SITE_OTHER): Payer: BC Managed Care – PPO | Admitting: Emergency Medicine

## 2012-08-08 ENCOUNTER — Encounter: Payer: Self-pay | Admitting: *Deleted

## 2012-08-08 ENCOUNTER — Other Ambulatory Visit (INDEPENDENT_AMBULATORY_CARE_PROVIDER_SITE_OTHER): Payer: BC Managed Care – PPO

## 2012-08-08 ENCOUNTER — Encounter: Payer: Self-pay | Admitting: Emergency Medicine

## 2012-08-08 VITALS — BP 110/62 | HR 84 | Temp 98.1°F | Ht 68.0 in | Wt 198.8 lb

## 2012-08-08 DIAGNOSIS — L408 Other psoriasis: Secondary | ICD-10-CM

## 2012-08-08 DIAGNOSIS — J449 Chronic obstructive pulmonary disease, unspecified: Secondary | ICD-10-CM

## 2012-08-08 DIAGNOSIS — I2789 Other specified pulmonary heart diseases: Secondary | ICD-10-CM

## 2012-08-08 DIAGNOSIS — I272 Pulmonary hypertension, unspecified: Secondary | ICD-10-CM

## 2012-08-08 LAB — HEPATIC FUNCTION PANEL
Bilirubin, Direct: 0.2 mg/dL (ref 0.0–0.3)
Total Bilirubin: 1.3 mg/dL — ABNORMAL HIGH (ref 0.3–1.2)

## 2012-08-08 LAB — BASIC METABOLIC PANEL
BUN: 22 mg/dL (ref 6–23)
Chloride: 95 mEq/L — ABNORMAL LOW (ref 96–112)
Glucose, Bld: 205 mg/dL — ABNORMAL HIGH (ref 70–99)
Potassium: 3.9 mEq/L (ref 3.5–5.1)

## 2012-08-08 NOTE — Assessment & Plan Note (Signed)
-  weaning pred back down to 38m

## 2012-08-08 NOTE — Assessment & Plan Note (Signed)
Discussed possible switch from inhaled iloprost to IV therapy. I agree with him that it is hard to commit to making that change when there is no guarantee that it will offer significant benefit.  - will staty on current regimen, including lasix 80 bid, check renal panel - defer IV therapy, may revisit later.

## 2012-08-08 NOTE — Assessment & Plan Note (Signed)
-  continue current inhaled medications

## 2012-08-08 NOTE — Patient Instructions (Addendum)
Please continue your current medications as you are taking them, including the increased lasix at 9m twice a day We will check blood work today to insure that this lasix dose is appropriate Follow with Dr BLamonte Sakaiin 2 months or sooner if you have any problems.

## 2012-08-08 NOTE — Progress Notes (Signed)
Subjective:    Patient ID: [name], male    DOB: 05-Jul-1950, 62 y.o.   MRN: [id number] HPI 62 yo man, former smoker with COPD, hx psoriatic arthritis and associated ILD, profound hypoxemia and associated Pulm HTN (PAP 57/26 mean 37, PAOP 14/11 mean 7 by cath 10/04/09. 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 Admitted 6/21-27 with resp failure and decompensated R heart failure. He was diuresed, treated with corticosteroids and started on bronchodilators for his COPD. O2 was titrated to 5L/min at rest, he is leaving it on 5 with exertion.   ROV 02/05/10 -- 62yo, PAH with Hx psoriatic arthritis, ILD and R heart failure, hypoxemia, on chronic pred 76m once daily. Also hx COPD.  On letaris since September '11. Since last time breathing may be a bit better. He has good days and bad days. Rarely uses ventolin. No HA, no syncope or near syncope. LE edema has been well controlled and stable. Was desaturated while walking today on presentation. Has been gaining some wt, aldactone was decreased by Dr SElisabeth Caraa month ago. PFTs done today.   ROV 04/16/10 -- Hx as above, follows up after TTE and 6 min walk. Returns feeling a bit more SOB w exertion. 6 min walk distance = 1665massoc with significant desat to 75% (after 2 min). Estimated RVSP 7954m(up from 59 in 09/2009). Has been on letaris since September. Edema seems better on lasix 63m35mo times a day. Clearly both TTE and walk distance are worse.   ROV 05/19/10 -- returns for f/u of the above issues. last time we started Tyvaso in addition to his letaris. Has been on it for a month. Up to 9 puffs 4x a day. Has been assoc with some HA, occas cough right after doing it, not intrusive. On Pred 10mg12miriva + ventoilin as needed. He is not noticing any big changes in his breathing yet on the Tyvaso. Monthly LFT's normal 1/17.   ROV 07/29/10 -- COPD, psoriatic arthritis + ILD, PAH. Regimen is Tyvaso + Letaris. [facility] They have told him he needs screening CSY, EtOH and tobacco random screening, counseling. Tells me today that his breathing is stable, although he has had trouble w allergies and w the pollen. Taking spiriva + SABA. O2 is set on 6L/min via oximizer.   ROV 09/23/10 -- COPD, psoriatic arthritis + ILD, PAH. He is feeling well, has been doing pulm rehab. Has been maintained on Tyvaso, Letaris. [facility] >> cont on current regimen  POST HOSP 10/10/10  Pt returns for post hospital follow up  Pt was admitted   6/11-6/15 for acute on chronic resp failure w/ underlying PF/RA/PAH.  CT chest was neg for PE, chronic changes along with diffuse ASPDZ -?alveolitis vs pulm edema. BNP >2000 . At discharge BNP 548.  He did receive increased steroids and diuresis along pulmonary hygiene.   Since discharge he is better but still remains weak and wears out easily.  Ankles are still puffy esp in the evening. He says he is off his spironolactone but unclear why-no mention in discharge summary why this was held   Today BMET shows K+ 3.7    ROV 10/24/10 -- COPD, hx psoriatic arthritis and associated ILD, profound hypoxemia and associated Pulm HTN (PAP 57/26 mean 37, PAOP 14/11 mean 7 by cath 10/04/09. 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 Admitted  6/11 - 15, still quite limited, not back to full speed.  Biggest complaint is exertional dyspnea and hypoxemia - to 60's on oximizer 6L/min. He continues to have exertional hypoxemia.   ROV 01/20/11 -- COPD, hx psoriatic arthritis (Dr Tonia Brooms) and associated ILD, profound hypoxemia and associated Pulm HTN (PAP 57/26 mean 37, PAOP 14/11 mean 7 by cath 10/04/09). 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 We had him evaluated for possible transplant, turned down at [facility]. Using 5-6L/min via oximizer at rest, up to 8-10L/min when moving. Using Spiriva and albuterol prn. Continues to have exertional SOB, may be worse. He is coughing more,  has nasal congestion last few days. Taking zyrtec. He is using some saline. Has humidity in his O2 concentrator. Still in the [facility] pulm rehab maintenance program.    ROV 05/18/11 -- COPD, hx psoriatic arthritis (Dr Tonia Brooms) and associated ILD, profound hypoxemia and associated Pulm HTN (PAP 57/26 mean 37, PAOP 14/11 mean 7 by cath 10/04/09). 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 Cough well controlled. He is on Letaris + Tyvaso. He had R heart cath at [facility] in Spring 2012, I don't have data. Needs 6 minute walk.   ROV 08/20/11 -- COPD, hx psoriatic arthritis (Dr Tonia Brooms) and associated ILD, profound hypoxemia and associated Pulm HTN (PAP 57/26 mean 37, PAOP 14/11 mean 7 by cath 10/04/09). 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  6 min walk distance 146mtoday. 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 Couldn't walk as far today, feels more dyspneic overall. Using 6L/min per oximizer. [facility]. On Spiriva + albuterol. Pt wants me to stop his aldactone and increase lasix, doesn't like the aldactone because of gynecomastia.   ROV 10/05/11 -- COPD, hx psoriatic arthritis (Dr GTonia Brooms and associated ILD, profound hypoxemia and associated Pulm HTN (PAP 57/26 mean 37, PAOP 14/11 mean 7 by cath 10/04/09). 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 Has spoken with CAldrichabout increasing his Tyvaso to 12 puffs qid. I did not do this because I don't know of any studies to support. Still occasionally does the rehab maintenance program. LFT done today.   ROV 11/25/11 -- COPD, hx psoriatic arthritis (Dr GTonia Brooms and associated ILD, profound hypoxemia and associated Pulm HTN (PAP 57/26 mean 37, PAOP 14/11 mean 7 by cath 10/04/09). 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 Tyvaso increased to 12 puffs 4x a day in 6/13 but not sure if it has helped, remains on xarelto and letaris 5. After wt gain of 10 lbs and more SOB, lasix  temporarily increase to 856mbid, then back down to 4026mid. He felt that his breathing was better when he was < 190 lbs, currently at 194lbs.   ROV 12/25/11 -- COPD, hx psoriatic arthritis (Dr GruTonia Broomsnd associated ILD, profound hypoxemia and associated Pulm HTN. On Letaris + Tyvaso. He remains hypoxic with exertion, even on oximizer.  Last time we continued 12 puffs tyvaso. He feels stable, able to exert some. Since last time [facility] in coming months. Wants the flu shot.   ROV 03/11/12 -- COPD, hx psoriatic arthritis (Dr GruTonia Broomsnd associated ILD, profound hypoxemia and associated Pulm HTN. On Letaris + Tyvaso 12 puffs. He remains hypoxic with exertion, even  on oximizer. Due for 6 minute walk in March. He has been having trouble with nose dryness, bleeding, due to high flow O2. He has tried nasal saline, saline gel, etc. Still very problematic.  His breathing has been fairly stable.   ROV 05/17/12 -- COPD, hx psoriatic arthritis (Dr Tonia Brooms) and associated ILD, profound hypoxemia and associated Pulm HTN. On Letaris + Tyvaso 12 puffs. Has been very difficult to keep him oxygenated, he is having progressive dyspnea. Has been seen at [facility] for transplant but not for [place]. He has more SOB, now unable to walk up the stairs - has started sleeping downstairs/. He uses albuterol tid (an increase) since last month.   ROV 06/22/12 -- COPD, hx psoriatic arthritis (Dr Tonia Brooms) and associated ILD, profound hypoxemia and associated Pulm HTN. On Letaris + Tyvaso 12 puffs.  He is profoundly hypoxemic and I suspect he needs IV therapy. He is still having problems with epistaxis, has seen ENT - due to his high flow O2. Last time we increased pred to 2m empirically - he may be recovering more quickly after exertion. Last TTE PASP was 50-60, 12/23/11.   [facility]  [facility]-4/9 for acute resp failure w/ bronchitis, superimposed on ILD associated w/ PAH w/  decompensated cor pulmonale .  Tx w/ abx, steroids , diuretics.  Since discharge he is feeling some better. But still weak no energy.  Some ankle edema worse in evening.  No hemoptysis, chest pain , fever or n/v.  Letaris was increased during admission , needs rx sent to pharmacy.  Currenlty on tapering dose of steorids, baseline is 135mdaily   08/08/12 -- chronic resp failure and severe PAH due to ILD, obstructive disease. He was admitted recently w decompensation and hypoxemia, ? After a URI. We increased letaris to 1024motherwise no real changes in therapy. Steroids tapering to 21m11med daily.  He reports more LE and foot edema. For the last week he has increased his lasix to 80mg61m.    Objective:   Physical Exam Filed Vitals:   08/08/12 1504  BP: 110/62  Pulse: 84  Temp: 98.1 F (36.7 C)   Gen: Pleasant, hyperpigmented face, no distress  ENT: No lesions,  mouth clear,  oropharynx clear, no postnasal drip, throat clearing. Nasal mucosa red and swollen bilaterally  Neck: No JVD, no TMG, no carotid bruits  Lungs: No use of accessory muscles, Bibasilar insp crackles. No wheeze  Cardiovascular: RRR, heart sounds normal, no murmur or gallops, trace pretibial edema  Musculoskeletal: No deformities, no cyanosis or clubbing  Neuro: alert, non focal  Skin: Warm, no lesions or rashes, hyperpigmented face psoriatic patches along arms and legs    Assessment & Plan:  PULMONARY HYPERTENSION, SECONDARY Discussed possible switch from inhaled iloprost to IV therapy. I agree with him that it is hard to commit to making that change when there is no guarantee that it will offer significant benefit.  - will staty on current regimen, including lasix 80 bid, check renal panel - defer IV therapy, may revisit later.   C O P D - continue current inhaled medications   PSORIASIS - weaning pred back down to 21mg67m

## 2012-08-09 NOTE — Progress Notes (Signed)
Quick Note:  Spoke with patient, made him aware of results and recs as listed below per RB. Verbalized understandind and nothing futher needed at this time. ______

## 2012-08-19 ENCOUNTER — Telehealth: Payer: Self-pay | Admitting: Emergency Medicine

## 2012-08-19 MED ORDER — POTASSIUM CHLORIDE CRYS ER 20 MEQ PO TBCR: EXTENDED_RELEASE_TABLET | ORAL | Status: DC

## 2012-08-19 NOTE — Telephone Encounter (Signed)
Pt requesting refill on potassium 90 day to be sent to [facility]. I advised pt will send RX. Also he states to let RB know that since the prednisone has dropped to 10 mg he has noticed his breathing is becoming more short. He states he seems to be managing okay. But thought RB should be aware. He does not have appt until June. He is wanting to know if he needs to increase the prednisone back. Please advise RB thanks  --pt states this is fine to wait until monday

## 2012-08-22 NOTE — Telephone Encounter (Signed)
Pt aware of recs. Nothing further was needed

## 2012-08-22 NOTE — Telephone Encounter (Signed)
Refill the potassium. He received a burst of prednisone while hospitalized, not clear whether he needs a new baseline pred dose. Could try 57m daily until our next visit, then assess at that time whether this has been beneficial.

## 2012-09-27 ENCOUNTER — Other Ambulatory Visit: Payer: Self-pay | Admitting: Emergency Medicine

## 2012-09-27 ENCOUNTER — Telehealth: Payer: Self-pay | Admitting: Emergency Medicine

## 2012-09-27 NOTE — Telephone Encounter (Signed)
According to our records, this has already been done. Pt is aware. Nothing further was needed.

## 2012-10-12 ENCOUNTER — Other Ambulatory Visit (INDEPENDENT_AMBULATORY_CARE_PROVIDER_SITE_OTHER): Payer: BC Managed Care – PPO

## 2012-10-12 ENCOUNTER — Encounter: Payer: Self-pay | Admitting: Emergency Medicine

## 2012-10-12 ENCOUNTER — Ambulatory Visit (INDEPENDENT_AMBULATORY_CARE_PROVIDER_SITE_OTHER): Payer: BC Managed Care – PPO | Admitting: Emergency Medicine

## 2012-10-12 VITALS — BP 110/60 | HR 87 | Temp 97.9°F | Ht 68.0 in | Wt 206.4 lb

## 2012-10-12 DIAGNOSIS — I2789 Other specified pulmonary heart diseases: Secondary | ICD-10-CM

## 2012-10-12 DIAGNOSIS — J449 Chronic obstructive pulmonary disease, unspecified: Secondary | ICD-10-CM

## 2012-10-12 DIAGNOSIS — L408 Other psoriasis: Secondary | ICD-10-CM

## 2012-10-12 DIAGNOSIS — I272 Pulmonary hypertension, unspecified: Secondary | ICD-10-CM

## 2012-10-12 DIAGNOSIS — J4489 Other specified chronic obstructive pulmonary disease: Secondary | ICD-10-CM

## 2012-10-12 DIAGNOSIS — J8410 Pulmonary fibrosis, unspecified: Secondary | ICD-10-CM

## 2012-10-12 LAB — COMPREHENSIVE METABOLIC PANEL
ALT: 44 U/L (ref 0–53)
AST: 31 U/L (ref 0–37)
CO2: 29 mEq/L (ref 19–32)
Calcium: 9.6 mg/dL (ref 8.4–10.5)
Chloride: 99 mEq/L (ref 96–112)
GFR: 64.14 mL/min (ref >60.00)
Potassium: 4.4 mEq/L (ref 3.5–5.1)
Sodium: 138 mEq/L (ref 135–145)
Total Protein: 6.8 g/dL (ref 6.0–8.3)

## 2012-10-12 NOTE — Progress Notes (Signed)
Subjective:    Patient ID: [name], male    DOB: 16-Mar-1951, 62 y.o.   MRN: [id number] HPI 63 yo man, former smoker with COPD, hx psoriatic arthritis and associated ILD, profound hypoxemia and associated Pulm HTN (PAP 57/26 mean 37, PAOP 14/11 mean 7 by cath 10/04/09. 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 Admitted 6/21-27 with resp failure and decompensated R heart failure. He was diuresed, treated with corticosteroids and started on bronchodilators for his COPD. O2 was titrated to 5L/min at rest, he is leaving it on 5 with exertion.   ROV 02/05/10 -- 62yo, PAH with Hx psoriatic arthritis, ILD and R heart failure, hypoxemia, on chronic pred 88m once daily. Also hx COPD.  On letaris since September '11. Since last time breathing may be a bit better. He has good days and bad days. Rarely uses ventolin. No HA, no syncope or near syncope. LE edema has been well controlled and stable. Was desaturated while walking today on presentation. Has been gaining some wt, aldactone was decreased by Dr SElisabeth Caraa month ago. PFTs done today.   ROV 04/16/10 -- Hx as above, follows up after TTE and 6 min walk. Returns feeling a bit more SOB w exertion. 6 min walk distance = 1639massoc with significant desat to 75% (after 2 min). Estimated RVSP 7972m(up from 59 in 09/2009). Has been on letaris since September. Edema seems better on lasix 56m75mo times a day. Clearly both TTE and walk distance are worse.   ROV 05/19/10 -- returns for f/u of the above issues. last time we started Tyvaso in addition to his letaris. Has been on it for a month. Up to 9 puffs 4x a day. Has been assoc with some HA, occas cough right after doing it, not intrusive. On Pred 10mg70miriva + ventoilin as needed. He is not noticing any big changes in his breathing yet on the Tyvaso. Monthly LFT's normal 1/17.   ROV 07/29/10 -- COPD, psoriatic arthritis + ILD, PAH. Regimen is Tyvaso + Letaris. [facility] They have told him he needs screening CSY, EtOH and tobacco random screening, counseling. Tells me today that his breathing is stable, although he has had trouble w allergies and w the pollen. Taking spiriva + SABA. O2 is set on 6L/min via oximizer.   ROV 09/23/10 -- COPD, psoriatic arthritis + ILD, PAH. He is feeling well, has been doing pulm rehab. Has been maintained on Tyvaso, Letaris. [facility] >> cont on current regimen  POST HOSP 10/10/10  Pt returns for post hospital follow up  Pt was admitted   6/11-6/15 for acute on chronic resp failure w/ underlying PF/RA/PAH.  CT chest was neg for PE, chronic changes along with diffuse ASPDZ -?alveolitis vs pulm edema. BNP >2000 . At discharge BNP 548.  He did receive increased steroids and diuresis along pulmonary hygiene.   Since discharge he is better but still remains weak and wears out easily.  Ankles are still puffy esp in the evening. He says he is off his spironolactone but unclear why-no mention in discharge summary why this was held   Today BMET shows K+ 3.7    ROV 10/24/10 -- COPD, hx psoriatic arthritis and associated ILD, profound hypoxemia and associated Pulm HTN (PAP 57/26 mean 37, PAOP 14/11 mean 7 by cath 10/04/09. 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 Admitted  6/11 - 15, still quite limited, not back to full speed.  Biggest complaint is exertional dyspnea and hypoxemia - to 60's on oximizer 6L/min. He continues to have exertional hypoxemia.   ROV 01/20/11 -- COPD, hx psoriatic arthritis (Dr Tonia Brooms) and associated ILD, profound hypoxemia and associated Pulm HTN (PAP 57/26 mean 37, PAOP 14/11 mean 7 by cath 10/04/09). 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 We had him evaluated for possible transplant, turned down at [facility]. Using 5-6L/min via oximizer at rest, up to 8-10L/min when moving. Using Spiriva and albuterol prn. Continues to have exertional SOB, may be worse. He is coughing more,  has nasal congestion last few days. Taking zyrtec. He is using some saline. Has humidity in his O2 concentrator. Still in the [facility] pulm rehab maintenance program.    ROV 05/18/11 -- COPD, hx psoriatic arthritis (Dr Tonia Brooms) and associated ILD, profound hypoxemia and associated Pulm HTN (PAP 57/26 mean 37, PAOP 14/11 mean 7 by cath 10/04/09). 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 Cough well controlled. He is on Letaris + Tyvaso. He had R heart cath at [facility] in Spring 2012, I don't have data. Needs 6 minute walk.   ROV 08/20/11 -- COPD, hx psoriatic arthritis (Dr Tonia Brooms) and associated ILD, profound hypoxemia and associated Pulm HTN (PAP 57/26 mean 37, PAOP 14/11 mean 7 by cath 10/04/09). 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  6 min walk distance 146mtoday. 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 Couldn't walk as far today, feels more dyspneic overall. Using 6L/min per oximizer. [facility] xarelto. On Spiriva + albuterol. Pt wants me to stop his aldactone and increase lasix, doesn't like the aldactone because of gynecomastia.   ROV 10/05/11 -- COPD, hx psoriatic arthritis (Dr GTonia Brooms and associated ILD, profound hypoxemia and associated Pulm HTN (PAP 57/26 mean 37, PAOP 14/11 mean 7 by cath 10/04/09). 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 Has spoken with CAldrichabout increasing his Tyvaso to 12 puffs qid. I did not do this because I don't know of any studies to support. Still occasionally does the rehab maintenance program. LFT done today.   ROV 11/25/11 -- COPD, hx psoriatic arthritis (Dr GTonia Brooms and associated ILD, profound hypoxemia and associated Pulm HTN (PAP 57/26 mean 37, PAOP 14/11 mean 7 by cath 10/04/09). 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 Tyvaso increased to 12 puffs 4x a day in 6/13 but not sure if it has helped, remains on xarelto and letaris 5. After wt gain of 10 lbs and more SOB, lasix  temporarily increase to 856mbid, then back down to 4026mid. He felt that his breathing was better when he was < 190 lbs, currently at 194lbs.   ROV 12/25/11 -- COPD, hx psoriatic arthritis (Dr GruTonia Broomsnd associated ILD, profound hypoxemia and associated Pulm HTN. On Letaris + Tyvaso. He remains hypoxic with exertion, even on oximizer.  Last time we continued 12 puffs tyvaso. He feels stable, able to exert some. Since last time he [facility] in coming months. Wants the flu shot.   ROV 03/11/12 -- COPD, hx psoriatic arthritis (Dr GruTonia Broomsnd associated ILD, profound hypoxemia and associated Pulm HTN. On Letaris + Tyvaso 12 puffs. He remains hypoxic with exertion, even  on oximizer. Due for 6 minute walk in March. He has been having trouble with nose dryness, bleeding, due to high flow O2. He has tried nasal saline, saline gel, etc. Still very problematic.  His breathing has been fairly stable.   ROV 05/17/12 -- COPD, hx psoriatic arthritis (Dr Tonia Brooms) and associated ILD, profound hypoxemia and associated Pulm HTN. On Letaris + Tyvaso 12 puffs. Has been very difficult to keep him oxygenated, he is having progressive dyspnea. [facility] for [facility] but not for [place]. He has more SOB, now unable to walk up the stairs - has started sleeping downstairs/. He uses albuterol tid (an increase) since last month.   ROV 06/22/12 -- COPD, hx psoriatic arthritis (Dr Tonia Brooms) and associated ILD, profound hypoxemia and associated Pulm HTN. On Letaris + Tyvaso 12 puffs.  He is profoundly hypoxemic and I suspect he needs IV therapy. He is still having problems with epistaxis, has seen ENT - due to his high flow O2. Last time we increased pred to 61m empirically - he may be recovering more quickly after exertion. Last TTE PASP was 50-60, 12/23/11.   [facility]  [facility]/31-4/9 for acute resp failure w/ bronchitis, superimposed on ILD associated w/ PAH w/  decompensated cor pulmonale .  Tx w/ abx, steroids , diuretics.  Since discharge he is feeling some better. But still weak no energy.  Some ankle edema worse in evening.  No hemoptysis, chest pain , fever or n/v.  Letaris was increased during admission , needs rx sent to pharmacy.  Currenlty on tapering dose of steorids, baseline is 166mdaily   08/08/12 -- chronic resp failure and severe PAH due to ILD, obstructive disease. He was admitted recently w decompensation and hypoxemia, ? After a URI. We increased letaris to 1042motherwise no real changes in therapy. Steroids tapering to 64m24med daily.  He reports more LE and foot edema. For the last week he has increased his lasix to 80mg39m.   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 He has severe chronic hypoxemia. He is on lasix 80 bid, tyvaso 12 puffs, letaris 10, pred 20, humira. We tried going down to pred 10, but his breathing worsened so we went back to 20 - he improved. His edema is better on the increased lasix.    Objective:   Physical Exam Filed Vitals:   10/12/12 1501  BP: 110/60  Pulse: 87  Temp: 97.9 F (36.6 C)   Gen: Pleasant, hyperpigmented face, no distress  ENT: No lesions,  mouth clear,  oropharynx clear, no postnasal drip, throat clearing. Nasal mucosa red and swollen bilaterally  Neck: No JVD, no TMG, no carotid bruits  Lungs: No use of accessory muscles, Bibasilar insp crackles. No wheeze  Cardiovascular: RRR, heart sounds normal, no murmur or gallops, trace pretibial edema  Musculoskeletal: No deformities, no cyanosis or clubbing  Neuro: alert, non focal  Skin: Warm, no lesions or rashes, hyperpigmented face psoriatic patches along arms and legs    Assessment & Plan:  PULMONARY FIBROSIS Continue current pred and humira (Dr GrubeTonia Brooms improved on pred 20 (suspect this helped his obstructive disease but may need to reimage in  the future to look for progression).   PULMONARY HYPERTENSION, SECONDARY - tyvaso 12 puffs - letaris 10 - O2 - xarelto - will table the discussion regarding IV therapy for now - continue lasix 80 bid and  check CMP today   C O P D - continue same regimen  PSORIASIS Humira, pred.

## 2012-10-12 NOTE — Assessment & Plan Note (Signed)
-  tyvaso 12 puffs - letaris 10 - O2 - xarelto - will table the discussion regarding IV therapy for now - continue lasix 80 bid and check CMP today

## 2012-10-12 NOTE — Assessment & Plan Note (Signed)
Continue current pred and humira (Dr Tonia Brooms). He improved on pred 20 (suspect this helped his obstructive disease but may need to reimage in the future to look for progression).

## 2012-10-12 NOTE — Patient Instructions (Addendum)
Please continue your medications as you are taking them Wear your oxygen at current settings Lab work today Follow with Dr Lamonte Sakai in 4 months or sooner if you have any problems.

## 2012-10-12 NOTE — Assessment & Plan Note (Signed)
-  continue same regimen

## 2012-10-12 NOTE — Assessment & Plan Note (Signed)
Humira, pred.

## 2012-10-13 NOTE — Progress Notes (Signed)
Quick Note:  ATC patient, no answer [id number] ______ 

## 2012-11-23 ENCOUNTER — Other Ambulatory Visit: Payer: Self-pay | Admitting: Emergency Medicine

## 2012-11-25 NOTE — Telephone Encounter (Signed)
Dr. Lamonte Sakai is this ok to fill? Please advise, thank you!!

## 2012-12-02 ENCOUNTER — Other Ambulatory Visit: Payer: Self-pay | Admitting: Emergency Medicine

## 2012-12-13 ENCOUNTER — Ambulatory Visit (INDEPENDENT_AMBULATORY_CARE_PROVIDER_SITE_OTHER): Payer: BC Managed Care – PPO | Admitting: Cardiology

## 2012-12-13 ENCOUNTER — Encounter: Payer: Self-pay | Admitting: Cardiology

## 2012-12-13 VITALS — BP 122/80 | HR 86 | Ht 68.0 in | Wt 210.2 lb

## 2012-12-13 DIAGNOSIS — I50812 Chronic right heart failure: Secondary | ICD-10-CM

## 2012-12-13 DIAGNOSIS — R0609 Other forms of dyspnea: Secondary | ICD-10-CM

## 2012-12-13 DIAGNOSIS — I2789 Other specified pulmonary heart diseases: Secondary | ICD-10-CM

## 2012-12-13 DIAGNOSIS — R601 Generalized edema: Secondary | ICD-10-CM

## 2012-12-13 DIAGNOSIS — R609 Edema, unspecified: Secondary | ICD-10-CM

## 2012-12-13 DIAGNOSIS — I279 Pulmonary heart disease, unspecified: Secondary | ICD-10-CM

## 2012-12-13 DIAGNOSIS — I272 Pulmonary hypertension, unspecified: Secondary | ICD-10-CM

## 2012-12-13 DIAGNOSIS — I739 Peripheral vascular disease, unspecified: Secondary | ICD-10-CM

## 2012-12-13 DIAGNOSIS — I2510 Atherosclerotic heart disease of native coronary artery without angina pectoris: Secondary | ICD-10-CM

## 2012-12-13 DIAGNOSIS — R0989 Other specified symptoms and signs involving the circulatory and respiratory systems: Secondary | ICD-10-CM

## 2012-12-13 DIAGNOSIS — I509 Heart failure, unspecified: Secondary | ICD-10-CM

## 2012-12-13 DIAGNOSIS — R0902 Hypoxemia: Secondary | ICD-10-CM

## 2012-12-13 NOTE — Patient Instructions (Addendum)
I understand that your breathing is progressively worsening - but the swelling is worse too.  I think we probably need to increase your diuretic dosing.  For 3 days take 3 of your furosemide 15m tabs 2 times daily then after that, I want you to take an additional 40 mg tab int he AM every other day.  I will write a new Rx for Furosemide 831mtabs (to make it easier) will write as 1-2 2 x daily as directed (so you do not run out).  Also, I think compression stockiings to help with swelling.  Let usKoreanow if you need a Rx for it.  I will check back in with you in ~6 months.  You will get a call within ~1-2 months of the time to schedule an appt.  HALeonie ManMD  Your physician wants you to follow-up in: 6 You will receive a reminder letter in the mail two months in advance. If you don't receive a letter, please call our office to schedule the follow-up appointment.

## 2012-12-15 ENCOUNTER — Telehealth: Payer: Self-pay | Admitting: Cardiology

## 2012-12-15 DIAGNOSIS — R600 Localized edema: Secondary | ICD-10-CM

## 2012-12-15 NOTE — Telephone Encounter (Signed)
Message forwarded to S. Hassell Done, RN.

## 2012-12-15 NOTE — Telephone Encounter (Signed)
Needs an order for compression stockings.

## 2012-12-16 ENCOUNTER — Encounter: Payer: Self-pay | Admitting: Cardiology

## 2012-12-16 MED ORDER — FUROSEMIDE 80 MG PO TABS: ORAL_TABLET | ORAL | Status: DC

## 2012-12-16 NOTE — Telephone Encounter (Signed)
Spoke to patient.  Asked if he wanted to purchase compression stocking from [facility] or medical supply store.  Pt states he would purchase from [employer].Informed him that a prescription will need to signed and Mailed to him. Pt.request prescription to be sent to [facility]. Will call patient back if this can be done or not.   Prescription for 20-30 mmhg knee-hi ,one pair.  Spoke to [place]- can fax order with type ,weight, phone number of patient.   Joylene Igo [phone] Informed patient that prescription was faxed.

## 2012-12-21 ENCOUNTER — Telehealth: Payer: Self-pay | Admitting: Cardiology

## 2012-12-21 NOTE — Telephone Encounter (Signed)
Returned call to [place].  Informed per Dr. Ellyn Hack.  Stated pt has so much swelling she doesn't think he will be able to use any OTC hose.  Informed Doris that Dr. Ellyn Hack stated "if tolerated" and RN will call pt to inform.  Advised she cancel Rx for compression hose for now.  Verbalized understanding.  Call to pt.  Left message to call back.

## 2012-12-21 NOTE — Telephone Encounter (Signed)
[name] is calling about the pt's compression hose. She put the hose on and with in a minutes his leg indented. Foot was blue.

## 2012-12-21 NOTE — Telephone Encounter (Signed)
Returned call and informed pt per instructions by MD/PA.  Pt verbalized understanding and agreed w/ plan.  Pt will call back if unable to use support hose for further instructions.

## 2012-12-21 NOTE — Telephone Encounter (Signed)
Stated she fitted pt for compression hose and a few minutes afterwards pt had indention on leg.  Stated pt also c/o his foot turning blue while in the fitting room.  Stated these compression hose will not work for pt.  Advised she hold order until further notice by Dr. Ellyn Hack.  Verbalized understanding.  Message forwarded to Dr. Ellyn Hack.

## 2012-12-21 NOTE — Telephone Encounter (Signed)
If compression hose are too tight - would try using simple support hose (not as tight).  Would probably need slightly larger & less firm compression stockings.  Just use support hose if tolerated.  Leonie Man, MD

## 2012-12-26 ENCOUNTER — Encounter: Payer: Self-pay | Admitting: Cardiology

## 2012-12-26 DIAGNOSIS — I2510 Atherosclerotic heart disease of native coronary artery without angina pectoris: Secondary | ICD-10-CM | POA: Insufficient documentation

## 2012-12-26 DIAGNOSIS — R601 Generalized edema: Secondary | ICD-10-CM | POA: Insufficient documentation

## 2012-12-26 DIAGNOSIS — I50812 Chronic right heart failure: Secondary | ICD-10-CM | POA: Insufficient documentation

## 2012-12-26 DIAGNOSIS — I272 Pulmonary hypertension, unspecified: Secondary | ICD-10-CM | POA: Insufficient documentation

## 2012-12-26 NOTE — Progress Notes (Signed)
Patient ID: [name], male   DOB: 10/30/1950, 62 y.o.   MRN: [id number] PCP: Horatio Pel, MD  Clinic Note: Chief Complaint  Patient presents with  . ROV 1 year    Chest discomfort, shortness of breath on exertion, swelling in legs-water retention, he is leaking fluid out of his abdomen   HPI: [name] is a 62 y.o. male with a PMH below who presents today for annual followup. He is a very complicated gentleman with long-standing pulmonary history of chronic idiopathic pulmonary fibrosis as well as COPD. He has severe pulmonary hypertension with pressures in the 70s. He also has PAD that was so severe insignificant as far as his aortoiliac disease that precluded him from undergoing lung transplant at [facility]. He only has moderate CAD with a 70% lesion the circumflex.  Interval History: He comes in today pretty much at his baseline. He is always short of breath with exertion. But he notes more frequent discomfort in his chest tightness, squeezing sensation. This is worse with exertion. He also notes significant swelling in his abdomen and legs. If she has fluid leaking out of his abdomen sporadically.  When you talk to him, he really doesn't make much of his symptoms.  He's noted a dramatic increase in swelling since I last saw him. He does have PND but more than he did along with orthopnea.  The remainder of Cardiovascular ROS: positive for - dyspnea on exertion, edema, orthopnea, paroxysmal nocturnal dyspnea, rapid heart rate and shortness of breath negative for - loss of consciousness or palpitations is as follows: Additional cardiac review of systems: Lightheadedness - occasionally, dizziness - no, syncope/near-syncope - no; TIA/amaurosis fugax - no Melena - no, hematochezia no; hematuria - no; nosebleeds - no; claudication - he probably wouldn't tablet simply does not walk enough to notice it.  PAST MEDICAL HISTORY:   Idiopathic pulmonary fibrosis followed by Dr. Londell Moh from [facility]. He also has some COPD with this.  -- Turned down for Transplant by [facility] due to PAD (AortoIliac); On chronic Home O2.  Associated pulmonary hypertension on echocardiogram with estimated pulmonary pressures in the 70 mmHg range. Also with a plethoric inferior vena cava.   R&LHC 2012: CO/CI Fick:  2.72 / 1.4.;  TD - 5.59 / 2.9 which was much better.   AO sat 88, PA sat 47.  PCWP: 11/14 ( mean 7 mmHg); PA pressures 57/26 mmHg and   RVP pressure is 60/10 mmHg (18 mmHg). RA pressure is 19/20 with a mean of 17.   Transpulmonary gradient (TPG) -= 19, so there is evidence of mostly primary Pulmonary Hypertension likely due to idiopathic pulmonary fibrosis.  [place] [facility] [place]: RA 14/11 mmHg, mean of 71mHg;  RVP 55/6-12mmHg, PAP 53/20 mmHg (350mg), PCWP 13/1243m, (65m72m, AoP 91/99 mmHg (66mm59m TPG of 22.   The pulmonary pressures are improved with medications. Tyvaso and Letairis.   Past Medical History  Diagnosis Date  . Pulmonary hypertension     Echocardiogram evidence suggest 70 mmHg; by Right Heart Cath pressures are 55-60 mmHg peak  . Thrombocytopenia, unspecified   . Chronic pulmonary heart disease, unspecified     On home oxygen  . Peripheral vascular disease, unspecified     ABI is 87 on the right and 100 on the left. ; Followed by Dr. ChrisShanon RosserA abdomen-pelvis @ [facility] Severe aortoiliac disease; left common iliac occlusion severe right external iliac disease; severe SMA stenosis  . Other and unspecified hyperlipidemia   .  Postinflammatory pulmonary fibrosis   . Other psoriasis   . Rheumatoid arthritis(714.0)   . Chronic respiratory failure   . Hypoxemia   . Chronic airway obstruction, not elsewhere classified   . Hyperlipidemia   . Perforation of colon 10/2001    After colonoscopy on 10/18/2001  . Internal hemorrhoids   . Diverticulosis   . Shortness of breath   . Chronic right-sided HF (heart failure)     Right-sided  . Headache(784.0)     . Cancer skin  . 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 CAD (coronary artery disease), native coronary artery 2012    Cath @ [facility]) --  70% prox Cx, prox RCA 30%, ~20% LM, D1.    Prior Cardiac Evaluation and Past Surgical History: Past Surgical History  Procedure Laterality Date  . Repair of cecal perforation  10/2001  . Colon surgery  2003    Secondary to bowel perforation after colonoscopy  . Back surgery    . Tonsillectomy    . Doppler echocardiography  June 2012    Normal LV size and function. Grade 1 diastolic dysfunction. Mild LA pressures elevation. A Cordis septal motion. Mildly dilated RV with increased pressures. Moderate to severe TR with PA pressures estimated 70 mmHg. Dilated IVC with blunted changes equals further CP.  Marland Kitchen Persantine myoview  June 2012    No ischemia or infarction  . Left and right heart catheterization  2012    70% circumflex lesion, 20-30% RCA, Left Main, D1;  RBP 55/6 mmHg (12 mmHg), BAP 53/20 mmHg. PCWP 13/12 mmHg (10). RAP 10 mmHg. They'll mean 66 mmHg, 91/49 mmHg.      No Known Allergies  Current Outpatient Prescriptions  Medication Sig Dispense Refill  . acetaminophen (TYLENOL ARTHRITIS PAIN) 650 MG CR tablet Take 1,300 mg by mouth 2 (two) times daily.       Marland Kitchen adalimumab (HUMIRA) 40 MG/0.8ML injection Inject 40 mg into the skin every 14 (fourteen) days.       Marland Kitchen albuterol (PROVENTIL) (2.5 MG/3ML) 0.083% nebulizer solution Take 3 mLs (2.5 mg total) by nebulization every 6 (six) hours as needed for wheezing.  360 mL  3  . ambrisentan (LETAIRIS) 10 MG tablet Take 1 tablet (10 mg total) by mouth daily.  30 tablet  5  . b complex vitamins tablet Take 1 tablet by mouth daily.      . Calcipotriene-Betameth Diprop (TACLONEX EX) Apply 1 application topically daily as needed.       . calcium-vitamin D (OSCAL) 250-125 MG-UNIT per tablet Take 1 tablet by mouth 2 (two) times daily.      . cetirizine (ZYRTEC) 10 MG tablet Take 10 mg by mouth daily.         . folic acid (FOLVITE) 1 MG tablet Take 1 mg by mouth daily.        Marland Kitchen guaiFENesin (MUCINEX) 600 MG 12 hr tablet Take 600 mg by mouth 2 (two) times daily.       Marland Kitchen KLOR-CON M20 20 MEQ tablet TAKE 2 TABLETS BY MOUTH TWICE A DAY  360 tablet  0  . Multiple Vitamin (MULTIVITAMIN) capsule Take 1 capsule by mouth daily.        . OXYGEN-HELIUM IN 8+ Liters continuous inhalation      . pantoprazole (PROTONIX) 40 MG tablet Take 40 mg by mouth daily.      . predniSONE (DELTASONE) 10 MG tablet Take 20 mg by mouth daily.      Marland Kitchen  simvastatin (ZOCOR) 20 MG tablet Take 20 mg by mouth at bedtime.       Marland Kitchen tiotropium (SPIRIVA) 18 MCG inhalation capsule Place 1 capsule (18 mcg total) into inhaler and inhale daily.  90 capsule  6  . Treprostinil (TYVASO IN) Inhale 12 puffs into the lungs 4 (four) times daily.      Alveda Reasons 20 MG TABS Take 20 mg by mouth daily.       . furosemide (LASIX) 80 MG tablet take1 to 2 tablets twice aday  360 tablet  3   No current facility-administered medications for this visit.    History   Social History Narrative   He is a married, father of 2, grandfather of 2. Due to his profound dyspnea and being on home oxygen, he is unable to exercise. He quit smoking many years ago.   He is extremely disabled/debilitated.    ROS: A comprehensive Review of Systems was negative except for symptoms above and:  - General ROS: positive for  - fatigue and malaise negative for - chills, fever, night sweats or sleep disturbance Psychological ROS: positive for - anxiety and depression ENT ROS: positive for - Intermittent nosebleeds, and the almost crushed appearance of his nose Hematological and Lymphatic ROS: positive for - bruising and fatigue negative for - blood clots, blood transfusions, jaundice or night sweats Respiratory ROS: positive for - cough, orthopnea, shortness of breath and wheezing negative for - hemoptysis, pleuritic pain, sputum changes, stridor or  tachypnea Dermatological ROS: Diffuse bluish hue to his face and upper body.  PHYSICAL EXAM BP 122/80  Pulse 86  Ht _0  (1.727 m)  Wt 210 lb 3.2 oz (95.346 kg)  BMI 31.97 kg/m2  SpO2 95% General appearance: alert, cooperative, appears older than stated age, fatigued and Chronically ill-appearing but very pleasant and in good humor. He is alert and oriented x3, answers questions properly. He is in no acute distress. Neck: no adenopathy, no carotid bruit, supple, symmetrical, trachea midline and Poppy supraclavicular area, but does appear to be pulsatile jugular veins that are several centimeters above the sternal notch. The tricuspid regurgitation wave is noted. Lungs: diminished breath sounds bilaterally and Overall poor air movement - , with diffuse dry crackles/dull pro- but mild bibasal rales also an associated dullness to percussion.. There is definitely increased worker breathing and is on chronic high flow O2. Heart: regular rate and rhythm, S1: normal intensity, S2: wide splitting, no S3 or S4 and No evidence of murmurs rubs or gallops. Abdomen: normal findings: bowel sounds normal, no masses palpable, soft, non-tender, symmetric and umbilicus normal and abnormal findings:  ascites, hepatomegaly, liver edge palpable 3 cm below costal margin and systolic bruit Extremities: edema Roughly 2+ bilaterally, no ulcers, gangrene or trophic changes, varicose veins noted and venous stasis dermatitis noted Pulses: Diminished bilateral lower extremities, palpable Skin: Diffuse, blue-purple which hue that is essentially chronic. As a large scar on his nose from previous cancer removal. Neurologic: Mental status: alertness: alert, orientation: time, date, person, place, city, president, affect: constricted Cranial nerves: normal  HMC:[id number] today: Yes Rate: 86 , Rhythm: Normal sinus rhythm Fat, first-degree AV block, possible left atrial abnormality, suggestion of RVH/pulmonary pattern, slightly  prolonged QT.-- No significant changes.  Recent Labs: Reviewed on Epic  ASSESSMENT / PLAN: Anasarca He clearly is volume overloaded at the current. This is probably acute on chronic right heart failure. However with his chest tightness, not sure this also involves some left ventricular pressures being elevated.  Plan: Increase Lasix dose as follows: For 3 days take 3 furosemide 13m tabs 2 times daily then after that, take an additional 40 mg tab int he AM every other day.  COR PULMONALE He is on Letairis and Tyvaso per pulmonary medicine. I think weeks of the knee just increase his diuresis to lower his filling pressures of both the right and left heart.  Unfortunately, does is getting much better on less his baseline lung disease improves which it seems very unlikely.  Continue symptomatic management. Consider increasing flow oxygen, especially with any type of exertion.  Chronic right-sided HF (heart failure) Increase Lasix. It is and is getting it worsens, would consider a repeat right heart catheterization just to get a better sense of his overall volume status. I will defer this to his pulmonary medicine doctors when they see him in return.  I did tell the possibility of wearing compression stockings to help with some of his lower extremity edema this can be bad for him recently.  CAD (coronary artery disease), native coronary artery He is noticing some chest pressure especially with exertion. I think simply increasing his oxygen with exertion will help. He does have an existing circumflex lesion that is stable in followup. At this particular time are not interested in pursuing any type of invasive procedure. He has pretty severe peripheral vascular disease, which would make cardiac catheterization very difficult if radial access is not able to be performed.  I do recommend that he take aspirin in that he is taking Xarelto for his oh prior DVT. He is taking simvastatin.  Unfortunately with his other medical conditions, he is not actually requiring antihypertensive regimens.   No orders of the defined types were placed in this encounter.   Meds ordered this encounter  Medications  . OXYGEN-HELIUM IN    Sig: 8+ Liters continuous inhalation  . furosemide (LASIX) 80 MG tablet    Sig: take1 to 2 tablets twice aday    Dispense:  360 tablet    Refill:  3    Followup: 6 months  Shakyra Mattera W. HEllyn Hack M.D., M.S. [facility] [address] [place]  [id number] [phone] # [phone]

## 2012-12-26 NOTE — Assessment & Plan Note (Signed)
Increase Lasix. It is and is getting it worsens, would consider a repeat right heart catheterization just to get a better sense of his overall volume status. I will defer this to his pulmonary medicine doctors when they see him in return.  I did tell the possibility of wearing compression stockings to help with some of his lower extremity edema this can be bad for him recently.

## 2012-12-26 NOTE — Assessment & Plan Note (Signed)
He is noticing some chest pressure especially with exertion. I think simply increasing his oxygen with exertion will help. He does have an existing circumflex lesion that is stable in followup. At this particular time are not interested in pursuing any type of invasive procedure. He has pretty severe peripheral vascular disease, which would make cardiac catheterization very difficult if radial access is not able to be performed.  I do recommend that he take aspirin in that he is taking Xarelto for his oh prior DVT. He is taking simvastatin. Unfortunately with his other medical conditions, he is not actually requiring antihypertensive regimens.

## 2012-12-26 NOTE — Assessment & Plan Note (Addendum)
He is on Letairis and Tyvaso per pulmonary medicine. I think weeks of the knee just increase his diuresis to lower his filling pressures of both the right and left heart.  Unfortunately, does is getting much better on less his baseline lung disease improves which it seems very unlikely.  Continue symptomatic management. Consider increasing flow oxygen, especially with any type of exertion.

## 2012-12-26 NOTE — Assessment & Plan Note (Addendum)
He clearly is volume overloaded at the current. This is probably acute on chronic right heart failure. However with his chest tightness, not sure this also involves some left ventricular pressures being elevated.   Plan: Increase Lasix dose as follows: For 3 days take 3 furosemide 40m tabs 2 times daily then after that, take an additional 40 mg tab int he AM every other day.

## 2013-01-11 ENCOUNTER — Emergency Department (HOSPITAL_COMMUNITY): Payer: BC Managed Care – PPO

## 2013-01-11 ENCOUNTER — Encounter (HOSPITAL_COMMUNITY): Payer: Self-pay | Admitting: Emergency Medicine

## 2013-01-11 ENCOUNTER — Inpatient Hospital Stay (HOSPITAL_COMMUNITY)
Admission: EM | Admit: 2013-01-11 | Discharge: 2013-01-19 | DRG: 544 | Disposition: A | Discharge status: Home / Self Care | Payer: BC Managed Care – PPO | Attending: Internal Medicine | Admitting: Internal Medicine

## 2013-01-11 DIAGNOSIS — E785 Hyperlipidemia, unspecified: Secondary | ICD-10-CM | POA: Diagnosis present

## 2013-01-11 DIAGNOSIS — M069 Rheumatoid arthritis, unspecified: Secondary | ICD-10-CM | POA: Diagnosis present

## 2013-01-11 DIAGNOSIS — J8410 Pulmonary fibrosis, unspecified: Secondary | ICD-10-CM | POA: Diagnosis present

## 2013-01-11 DIAGNOSIS — I509 Heart failure, unspecified: Secondary | ICD-10-CM | POA: Diagnosis present

## 2013-01-11 DIAGNOSIS — Z86718 Personal history of other venous thrombosis and embolism: Secondary | ICD-10-CM

## 2013-01-11 DIAGNOSIS — Z79899 Other long term (current) drug therapy: Secondary | ICD-10-CM

## 2013-01-11 DIAGNOSIS — J3489 Other specified disorders of nose and nasal sinuses: Secondary | ICD-10-CM

## 2013-01-11 DIAGNOSIS — I2789 Other specified pulmonary heart diseases: Secondary | ICD-10-CM

## 2013-01-11 DIAGNOSIS — J449 Chronic obstructive pulmonary disease, unspecified: Secondary | ICD-10-CM

## 2013-01-11 DIAGNOSIS — I739 Peripheral vascular disease, unspecified: Secondary | ICD-10-CM | POA: Diagnosis present

## 2013-01-11 DIAGNOSIS — Z66 Do not resuscitate: Secondary | ICD-10-CM | POA: Diagnosis present

## 2013-01-11 DIAGNOSIS — J441 Chronic obstructive pulmonary disease with (acute) exacerbation: Secondary | ICD-10-CM | POA: Diagnosis present

## 2013-01-11 DIAGNOSIS — I279 Pulmonary heart disease, unspecified: Secondary | ICD-10-CM

## 2013-01-11 DIAGNOSIS — I2510 Atherosclerotic heart disease of native coronary artery without angina pectoris: Secondary | ICD-10-CM | POA: Diagnosis present

## 2013-01-11 DIAGNOSIS — R609 Edema, unspecified: Secondary | ICD-10-CM

## 2013-01-11 DIAGNOSIS — E876 Hypokalemia: Secondary | ICD-10-CM | POA: Diagnosis present

## 2013-01-11 DIAGNOSIS — IMO0002 Reserved for concepts with insufficient information to code with codable children: Secondary | ICD-10-CM

## 2013-01-11 DIAGNOSIS — Z9981 Dependence on supplemental oxygen: Secondary | ICD-10-CM

## 2013-01-11 DIAGNOSIS — R791 Abnormal coagulation profile: Secondary | ICD-10-CM | POA: Diagnosis present

## 2013-01-11 DIAGNOSIS — Z7901 Long term (current) use of anticoagulants: Secondary | ICD-10-CM

## 2013-01-11 DIAGNOSIS — I129 Hypertensive chronic kidney disease with stage 1 through stage 4 chronic kidney disease, or unspecified chronic kidney disease: Secondary | ICD-10-CM | POA: Diagnosis present

## 2013-01-11 DIAGNOSIS — R601 Generalized edema: Secondary | ICD-10-CM | POA: Diagnosis present

## 2013-01-11 DIAGNOSIS — Z87891 Personal history of nicotine dependence: Secondary | ICD-10-CM

## 2013-01-11 DIAGNOSIS — I272 Pulmonary hypertension, unspecified: Secondary | ICD-10-CM

## 2013-01-11 DIAGNOSIS — I50812 Chronic right heart failure: Secondary | ICD-10-CM | POA: Diagnosis present

## 2013-01-11 DIAGNOSIS — I2781 Cor pulmonale (chronic): Secondary | ICD-10-CM

## 2013-01-11 DIAGNOSIS — J9620 Acute and chronic respiratory failure, unspecified whether with hypoxia or hypercapnia: Secondary | ICD-10-CM | POA: Diagnosis present

## 2013-01-11 DIAGNOSIS — I5043 Acute on chronic combined systolic (congestive) and diastolic (congestive) heart failure: Principal | ICD-10-CM | POA: Diagnosis present

## 2013-01-11 DIAGNOSIS — D696 Thrombocytopenia, unspecified: Secondary | ICD-10-CM | POA: Diagnosis present

## 2013-01-11 DIAGNOSIS — N189 Chronic kidney disease, unspecified: Secondary | ICD-10-CM | POA: Diagnosis present

## 2013-01-11 DIAGNOSIS — L408 Other psoriasis: Secondary | ICD-10-CM | POA: Diagnosis present

## 2013-01-11 DIAGNOSIS — R0902 Hypoxemia: Secondary | ICD-10-CM | POA: Diagnosis present

## 2013-01-11 DIAGNOSIS — L4050 Arthropathic psoriasis, unspecified: Secondary | ICD-10-CM | POA: Diagnosis present

## 2013-01-11 LAB — TROPONIN I: Troponin I: <0.3 ng/mL (ref <0.30)

## 2013-01-11 LAB — POCT I-STAT 3, ART BLOOD GAS (G3+)
TCO2: 30 mmol/L (ref 0–100)
pCO2 arterial: 46.7 mmHg — ABNORMAL HIGH (ref 35.0–45.0)
pH, Arterial: 7.398 (ref 7.350–7.450)
pO2, Arterial: 153 mmHg — ABNORMAL HIGH (ref 80.0–100.0)

## 2013-01-11 LAB — CBC WITH DIFFERENTIAL/PLATELET
Basophils Relative: 0 % (ref 0–1)
Lymphocytes Relative: 19 % (ref 12–46)
Lymphs Abs: 2.1 10*3/uL (ref 0.7–4.0)
Neutrophils Relative %: 71 % (ref 43–77)
Platelets: 137 10*3/uL — ABNORMAL LOW (ref 150–400)
RBC: 5.77 MIL/uL (ref 4.22–5.81)
WBC: 10.9 10*3/uL — ABNORMAL HIGH (ref 4.0–10.5)

## 2013-01-11 LAB — BASIC METABOLIC PANEL
CO2: 29 mEq/L (ref 19–32)
Calcium: 8.7 mg/dL (ref 8.4–10.5)
GFR calc non Af Amer: 67 mL/min — ABNORMAL LOW (ref >90)
Sodium: 140 mEq/L (ref 135–145)

## 2013-01-11 LAB — PRO B NATRIURETIC PEPTIDE: Pro B Natriuretic peptide (BNP): 3900 pg/mL — ABNORMAL HIGH (ref 0–125)

## 2013-01-11 LAB — MRSA PCR SCREENING: MRSA by PCR: NEGATIVE

## 2013-01-11 MED ORDER — TREPROSTINIL 0.6 MG/ML IN SOLN
18.0000 ug | RESPIRATORY_TRACT | Status: DC
  Administered 2013-01-12 (×4): 18 ug via RESPIRATORY_TRACT
  Filled 2013-01-11 (×4): qty 0.03

## 2013-01-11 MED ORDER — METHYLPREDNISOLONE SODIUM SUCC 40 MG IJ SOLR
40.0000 mg | Freq: Two times a day (BID) | INTRAMUSCULAR | Status: DC
  Administered 2013-01-11 – 2013-01-16 (×12): 40 mg via INTRAVENOUS
  Filled 2013-01-11 (×16): qty 1

## 2013-01-11 MED ORDER — FUROSEMIDE 10 MG/ML IJ SOLN
80.0000 mg | Freq: Once | INTRAMUSCULAR | Status: AC
  Administered 2013-01-11: 80 mg via INTRAVENOUS
  Filled 2013-01-11: qty 8

## 2013-01-11 MED ORDER — ALBUTEROL SULFATE (5 MG/ML) 0.5% IN NEBU
2.5000 mg | INHALATION_SOLUTION | Freq: Four times a day (QID) | RESPIRATORY_TRACT | Status: DC
  Administered 2013-01-11 – 2013-01-19 (×31): 2.5 mg via RESPIRATORY_TRACT
  Filled 2013-01-11 (×32): qty 0.5

## 2013-01-11 MED ORDER — PANTOPRAZOLE SODIUM 40 MG PO TBEC
40.0000 mg | DELAYED_RELEASE_TABLET | Freq: Every day | ORAL | Status: DC
  Administered 2013-01-12 – 2013-01-19 (×8): 40 mg via ORAL
  Filled 2013-01-11 (×8): qty 1

## 2013-01-11 MED ORDER — SIMVASTATIN 20 MG PO TABS
20.0000 mg | ORAL_TABLET | Freq: Every day | ORAL | Status: DC
  Administered 2013-01-12 – 2013-01-18 (×7): 20 mg via ORAL
  Filled 2013-01-11 (×9): qty 1

## 2013-01-11 MED ORDER — AMBRISENTAN 10 MG PO TABS
10.0000 mg | ORAL_TABLET | Freq: Every day | ORAL | Status: DC
  Administered 2013-01-11 – 2013-01-19 (×9): 10 mg via ORAL
  Filled 2013-01-11 (×5): qty 1

## 2013-01-11 MED ORDER — METHYLPREDNISOLONE SODIUM SUCC 125 MG IJ SOLR
125.0000 mg | Freq: Once | INTRAMUSCULAR | Status: AC
  Administered 2013-01-11: 125 mg via INTRAVENOUS
  Filled 2013-01-11: qty 2

## 2013-01-11 MED ORDER — LEVOFLOXACIN IN D5W 750 MG/150ML IV SOLN
750.0000 mg | INTRAVENOUS | Status: DC
  Administered 2013-01-11 – 2013-01-13 (×3): 750 mg via INTRAVENOUS
  Filled 2013-01-11 (×3): qty 150

## 2013-01-11 MED ORDER — MULTIVITAMINS PO CAPS: 1.0000 | ORAL_CAPSULE | Freq: Every day | ORAL | Status: DC

## 2013-01-11 MED ORDER — TREPROSTINIL 0.6 MG/ML IN SOLN
18.0000 ug | Freq: Four times a day (QID) | RESPIRATORY_TRACT | Status: DC
  Filled 2013-01-11 (×30): qty 0.03

## 2013-01-11 MED ORDER — AMBRISENTAN 5 MG PO TABS: 10.0000 mg | ORAL_TABLET | Freq: Every day | ORAL | Status: DC

## 2013-01-11 MED ORDER — RIVAROXABAN 20 MG PO TABS: 20.0000 mg | ORAL_TABLET | Freq: Every day | ORAL | Status: DC

## 2013-01-11 MED ORDER — LORATADINE 10 MG PO TABS
10.0000 mg | ORAL_TABLET | Freq: Every day | ORAL | Status: DC
  Administered 2013-01-12 – 2013-01-19 (×8): 10 mg via ORAL
  Filled 2013-01-11 (×8): qty 1

## 2013-01-11 MED ORDER — CALCIUM CARBONATE-VITAMIN D 500-200 MG-UNIT PO TABS
0.5000 | ORAL_TABLET | Freq: Two times a day (BID) | ORAL | Status: DC
  Administered 2013-01-11 – 2013-01-19 (×16): 0.5 via ORAL
  Filled 2013-01-11 (×19): qty 0.5

## 2013-01-11 MED ORDER — ALBUTEROL (5 MG/ML) CONTINUOUS INHALATION SOLN
10.0000 mg/h | INHALATION_SOLUTION | RESPIRATORY_TRACT | Status: DC
  Administered 2013-01-11: 10 mg/h via RESPIRATORY_TRACT
  Filled 2013-01-11: qty 20

## 2013-01-11 MED ORDER — SODIUM CHLORIDE 0.9 % IV SOLN: 250.0000 mL | INTRAVENOUS | Status: DC | PRN

## 2013-01-11 MED ORDER — ADULT MULTIVITAMIN W/MINERALS CH
1.0000 | ORAL_TABLET | Freq: Every day | ORAL | Status: DC
  Administered 2013-01-11 – 2013-01-19 (×9): 1 via ORAL
  Filled 2013-01-11 (×9): qty 1

## 2013-01-11 MED ORDER — SODIUM CHLORIDE 0.9 % IV SOLN
INTRAVENOUS | Status: DC
  Administered 2013-01-11: 10:00:00 via INTRAVENOUS

## 2013-01-11 MED ORDER — POTASSIUM CHLORIDE CRYS ER 20 MEQ PO TBCR
40.0000 meq | EXTENDED_RELEASE_TABLET | Freq: Two times a day (BID) | ORAL | Status: DC
  Administered 2013-01-11 – 2013-01-12 (×4): 40 meq via ORAL
  Filled 2013-01-11 (×6): qty 2

## 2013-01-11 MED ORDER — RIVAROXABAN 20 MG PO TABS
20.0000 mg | ORAL_TABLET | Freq: Every day | ORAL | Status: DC
  Administered 2013-01-11 – 2013-01-18 (×8): 20 mg via ORAL
  Filled 2013-01-11 (×10): qty 1

## 2013-01-11 MED ORDER — IPRATROPIUM BROMIDE 0.02 % IN SOLN
0.5000 mg | Freq: Four times a day (QID) | RESPIRATORY_TRACT | Status: DC
  Administered 2013-01-11 – 2013-01-19 (×31): 0.5 mg via RESPIRATORY_TRACT
  Filled 2013-01-11 (×32): qty 2.5

## 2013-01-11 MED ORDER — B COMPLEX PO TABS: 1.0000 | ORAL_TABLET | Freq: Every day | ORAL | Status: DC

## 2013-01-11 MED ORDER — FOLIC ACID 1 MG PO TABS
1.0000 mg | ORAL_TABLET | Freq: Every day | ORAL | Status: DC
  Administered 2013-01-12 – 2013-01-19 (×8): 1 mg via ORAL
  Filled 2013-01-11 (×8): qty 1

## 2013-01-11 MED ORDER — CALCIUM-VITAMIN D 250-125 MG-UNIT PO TABS: 1.0000 | ORAL_TABLET | Freq: Two times a day (BID) | ORAL | Status: DC

## 2013-01-11 MED ORDER — B COMPLEX-C PO TABS
1.0000 | ORAL_TABLET | Freq: Every day | ORAL | Status: DC
  Administered 2013-01-11 – 2013-01-19 (×9): 1 via ORAL
  Filled 2013-01-11 (×9): qty 1

## 2013-01-11 NOTE — ED Notes (Signed)
Per EMS- patient coming from home, current active DNR in place. Pt c/o SOB for three days with no relief from continious 8L O2 via [place]. Rales to R lower lobe, L lobe clear. IV attempt without success.

## 2013-01-11 NOTE — ED Notes (Addendum)
Tyvaso ordered is a daily med.  Pharmacy states that they do no have that medication and will need to use pt home supply.  Pt states wife took all meds home with her.  RN called wife to bring meds back when she returned.

## 2013-01-11 NOTE — ED Notes (Signed)
Spoke to pharmacy, pt daily meds will be rescheduled when he receives a bed.

## 2013-01-11 NOTE — ED Provider Notes (Signed)
Medical screening examination/treatment/procedure(s) were conducted as a shared visit with non-physician practitioner(s) and myself.  I personally evaluated the patient during the encounter Patient with a history of chronic pulmonary disease and CHF presents with increased shortness of breath. Patient is on 8 L of oxygen at home. He states he does not want to intubated or placed on BiPAP. He is a DO NOT RESUSCITATE at bedside. Patient treated for CHF/COPD exacerbation. Placed on nonrebreather with improvement of his oxygenation discussed with patient's cardiologist who will see in emergency department. Patient's pulmonary doctor will admit.  Julianne Rice, MD 01/11/13 [phone]

## 2013-01-11 NOTE — ED Notes (Signed)
Pt presents to department for evaluation of SOB. States he has been short of breath x3 days, became worse this morning. History of CHF and COPD. Wears 8L O2 at home. Also states midsternal chest pressure, 5/10 at the time. Respirations labored. Speaking short phrases upon arrival to ED. He is conscious alert and oriented x4. Skin noted to be dry and dusky/gray color. Rhonchi heard throughout all lung fields.

## 2013-01-11 NOTE — ED Notes (Signed)
Pt from home with HX of COPD. DNR.  Pt on 8l o2 at home.  Pt presents with copd symptoms O2 sats 96 on 15L non rebreather.  Pt currently speaking in short sentences.  Pt alert oriented X4

## 2013-01-11 NOTE — Consult Note (Addendum)
Reason for Consult: CHF Referring Physician:   [name] is an 62 y.o. male.  HPI:   [name] is a 62 y.o. male with a history of pulmonary fibrosis, pulm HTN, Cor pulmonale, PVD, RA, HLD, CAD, COPD.  He has severe pulmonary hypertension with pressures in the 70s.  He only has moderate CAD with a 70% lesion the circumflex.  He takes Xarelto for for prior DVT.  He presents with increased wt, worsening SOB for 2-7 days, orthopnea, LEE, chills, cough.  His lasix was increased by Dr. Ellyn Hack back in August but he has not had very good results.  The patient currently denies nausea, vomiting, fever, chest pain, abdominal pain, hematochezia, melena, lower extremity edema, claudication.  He is DNR but is ok with CPAP.  He uses 8L/min of O2 at home.      Past Medical History  Diagnosis Date  . Pulmonary hypertension     Echocardiogram evidence suggest 70 mmHg; by Right Heart Cath pressures are 55-60 mmHg peak  . Thrombocytopenia, unspecified   . Chronic pulmonary heart disease, unspecified     On home oxygen  . Peripheral vascular disease, unspecified     ABI is 87 on the right and 100 on the left. ; Followed by Dr. Shanon Rosser.  CTA abdomen-pelvis @ [facility] Severe aortoiliac disease; left common iliac occlusion severe right external iliac disease; severe SMA stenosis  . Other and unspecified hyperlipidemia   . Postinflammatory pulmonary fibrosis   . Other psoriasis   . Rheumatoid arthritis(714.0)   . Chronic respiratory failure   . Hypoxemia   . Chronic airway obstruction, not elsewhere classified   . Hyperlipidemia   . Perforation of colon 10/2001    After colonoscopy on 10/18/2001  . Internal hemorrhoids   . Diverticulosis   . Shortness of breath   . Chronic right-sided HF (heart failure)     Right-sided  . Headache(784.0)   . Cancer skin  . 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 CAD (coronary artery disease), native coronary artery 2012    Cath @ [facility]) --  70%  prox Cx, prox RCA 30%, ~20% LM, D1.    Past Surgical History  Procedure Laterality Date  . Repair of cecal perforation  10/2001  . Colon surgery  2003    Secondary to bowel perforation after colonoscopy  . Back surgery    . Tonsillectomy    . Doppler echocardiography  June 2012    Normal LV size and function. Grade 1 diastolic dysfunction. Mild LA pressures elevation. A Cordis septal motion. Mildly dilated RV with increased pressures. Moderate to severe TR with PA pressures estimated 70 mmHg. Dilated IVC with blunted changes equals further CP.  Marland Kitchen Persantine myoview  June 2012    No ischemia or infarction  . Left and right heart catheterization  2012    70% circumflex lesion, 20-30% RCA, Left Main, D1;  RBP 55/6 mmHg (12 mmHg), BAP 53/20 mmHg. PCWP 13/12 mmHg (10). RAP 10 mmHg. They'll mean 66 mmHg, 91/49 mmHg.      Family History  Problem Relation Age of Onset  . Heart disease Mother   . Diabetes Mother   . Diabetes Brother     Social History:  reports that he quit smoking about 6 years ago. His smoking use included Cigarettes. He has a 84 pack-year smoking history. He has never used smokeless tobacco. He reports that he drinks about 0.6 ounces of alcohol per  week. He reports that he does not use illicit drugs.  Allergies: No Known Allergies  Medications:  Prior to Admission medications   Medication Sig Start Date End Date Taking? Authorizing Provider  acetaminophen (TYLENOL ARTHRITIS PAIN) 650 MG CR tablet Take 1,300 mg by mouth 2 (two) times daily.    Yes Historical Provider, MD  adalimumab (HUMIRA) 40 MG/0.8ML injection Inject 40 mg into the skin every 14 (fourteen) days.    Yes Historical Provider, MD  albuterol (PROVENTIL) (2.5 MG/3ML) 0.083% nebulizer solution Take 3 mLs (2.5 mg total) by nebulization every 6 (six) hours as needed for wheezing. 06/21/12  Yes Collene Gobble, MD  ambrisentan (LETAIRIS) 10 MG tablet Take 1 tablet (10 mg total) by mouth daily. 08/01/12  Yes Tammy S  Parrett, NP  b complex vitamins tablet Take 1 tablet by mouth daily.   Yes Historical Provider, MD  Calcipotriene-Betameth Diprop (TACLONEX EX) Apply 1 application topically daily as needed.    Yes Historical Provider, MD  calcium-vitamin D (OSCAL) 250-125 MG-UNIT per tablet Take 1 tablet by mouth 2 (two) times daily.   Yes Historical Provider, MD  cetirizine (ZYRTEC) 10 MG tablet Take 10 mg by mouth daily.     Yes Historical Provider, MD  folic acid (FOLVITE) 1 MG tablet Take 1 mg by mouth daily.     Yes Historical Provider, MD  furosemide (LASIX) 80 MG tablet take1 to 2 tablets twice aday 12/13/12  Yes Leonie Man, MD  furosemide (LASIX) 80 MG tablet Take 80-120 mg by mouth 2 (two) times daily. Takes 123m in the morning on Monday Wednesday Friday and _0 /24/14  7:30 AM      Result Value Range   Sodium 140  135 - 145 mEq/L   Potassium 3.4 (*) 3.5 - 5.1 mEq/L   Chloride 101  96 - 112 mEq/L   CO2 29  19 - 32 mEq/L   Glucose, Bld 101 (*) 70 - 99 mg/dL   BUN 19  6 - 23 mg/dL   Creatinine, Ser 1.13  0.50 - 1.35 mg/dL   Calcium 8.7  8.4 - 10.5 mg/dL   GFR calc non Af Amer 67 (*) >90 mL/min  GFR calc Af Amer 78 (*) >90 mL/min   Comment: (NOTE)     The eGFR has been calculated using the CKD EPI equation.     This calculation has not been validated in all clinical situations.     eGFR's persistently <90 mL/min signify possible Chronic Kidney     Disease.  CBC WITH DIFFERENTIAL     Status: Abnormal   Collection Time    01/11/13  7:30 AM      Result Value Range   WBC 10.9 (*) 4.0 - 10.5 K/uL   RBC 5.77  4.22 - 5.81 MIL/uL   Hemoglobin 16.1  13.0 - 17.0 g/dL   HCT 53.2 (*) 39.0 - 52.0 %   MCV 92.2  78.0 - 100.0 fL   MCH 27.9  26.0 - 34.0 pg   MCHC 30.3  30.0 - 36.0 g/dL   RDW 17.1 (*) 11.5 - 15.5 %   Platelets 137 (*) 150 - 400 K/uL   Neutrophils Relative % 71  43 - 77 %   Neutro Abs 7.7  1.7 - 7.7 K/uL   Lymphocytes Relative 19  12 - 46 %   Lymphs Abs 2.1  0.7 - 4.0 K/uL   Monocytes Relative 7  3 - 12 %   Monocytes Absolute 0.8  0.1 - 1.0 K/uL   Eosinophils Relative 2  0 - 5 %   Eosinophils Absolute 0.3  0.0 - 0.7 K/uL   Basophils Relative 0  0 - 1 %   Basophils Absolute 0.0  0.0 - 0.1 K/uL  TROPONIN I     Status: None   Collection Time    01/11/13  7:30 AM      Result Value Range   Troponin I <0.30  <0.30 ng/mL   Comment:            Due to the release kinetics of cTnI,     a negative result within the first hours     of the onset of symptoms does not rule out     myocardial infarction with certainty.     If myocardial infarction is still suspected,     repeat the test at appropriate intervals.  PRO B NATRIURETIC PEPTIDE     Status: Abnormal   Collection Time    01/11/13  7:30 AM      Result Value Range   Pro B  Natriuretic peptide (BNP) 3900.0 (*) 0 - 125 pg/mL  POCT I-STAT 3, BLOOD GAS (G3+)     Status: Abnormal   Collection Time    01/11/13  8:10 AM      Result Value Range   pH, Arterial 7.398  7.350 - 7.450   pCO2 arterial 46.7 (*) 35.0 - 45.0 mmHg   pO2, Arterial 153.0 (*) 80.0 - 100.0 mmHg   Bicarbonate 28.9 (*) 20.0 - 24.0 mEq/L   TCO2 30  0 - 100 mmol/L   O2 Saturation 99.0     Acid-Base Excess 3.0 (*) 0.0 - 2.0 mmol/L   Patient temperature 98.1 F     Collection site RADIAL, ALLEN'S TEST ACCEPTABLE     Drawn by Operator     Sample type ARTERIAL      Dg Chest Portable 1 View  01/11/2013   CLINICAL DATA:  Shortness of breath, congestive heart failure  EXAM: PORTABLE CHEST - 1 VIEW  COMPARISON:  Portable chest x-ray of 07/26/2012  FINDINGS: There is little change in diffuse airspace disease and possible small effusions. Cardiomegaly  is stable. Some of the interstitial prominence could be chronic in nature.  IMPRESSION: Cardiomegaly and probable pulmonary vascular congestion possibly superimposed on chronic interstitial change.   Electronically Signed   By: Ivar Drape M.D.   On: 01/11/2013 07:59    Review of Systems  Constitutional: Positive for chills and malaise/fatigue. Negative for fever.  HENT: Negative for sore throat.   Respiratory: Positive for cough and shortness of breath. Negative for wheezing.   Cardiovascular: Positive for orthopnea, leg swelling and PND. Negative for chest pain.  Gastrointestinal: Negative for nausea, vomiting and abdominal pain.  Neurological: Positive for dizziness.  All other systems reviewed and are negative.   Blood pressure 118/69, pulse 75, temperature 98.1 F (36.7 C), temperature source Oral, resp. rate 26, height _0  (1.727 m), weight 212 lb (96.163 kg), SpO2 91.00%. Physical Exam  Constitutional: He is oriented to person, place, and time. He appears distressed.  Overweight.  HENT:  Head: Normocephalic and atraumatic.  Eyes: EOM are  normal. Pupils are equal, round, and reactive to light. No scleral icterus.  Neck: Normal range of motion. JVD present.  Cardiovascular: Normal rate, regular rhythm, S1 normal and S2 normal.   No murmur heard. Pulses:      Radial pulses are 2+ on the right side, and 2+ on the left side.       Dorsalis pedis pulses are 1+ on the right side, and 1+ on the left side.  Respiratory: Effort normal. He has no wheezes. He has rales.  GI: Soft. Bowel sounds are normal. He exhibits distension. There is no tenderness.  Musculoskeletal: He exhibits edema.  1+ LEE  Neurological: He is alert and oriented to person, place, and time. He exhibits normal muscle tone.  Skin: Skin is warm and dry.  Psychiatric: He has a normal mood and affect.    Assessment/Plan:  Principal Problem:   Acute and chronic respiratory failure Active Problems:   PULMONARY FIBROSIS   Chronic right-sided HF (heart failure)   Pulmonary hypertension   C O P D   Hypoxemia   CAD (coronary artery disease), native coronary artery   Hypokalemia  Plan: BNP is elevated at 3900.  At discharge in April his weight was 191#. Today he is 212#.  He has had IV Lasix 75m x one so far.  I would continue IV dosing at 83mBID and even consider a lasix drip given his respiratory status. Further mgt per PCCM.    HAGER, BRYAN 01/11/2013, 9:12 AM    Patient seen and examined. Agree with assessment and plan. Pleasant 6446o WM followed by Dr. HaEllyn Hackrom a cardiology perspective. He has a history of pulmonary fibrosis with severe pulmonary hypertension, cor pulmonale, mod CAD with 70% LCx stenosis, PVD with aortoiliac disease, RA, and remote DVT for which he takes xarelto for anticoagulation.  He apparently has been turned down for lung transplantation.  LVEF by echo 60% in 3/5/4270ith both systolic and diastolic septal flattening. He has chronic dyspnea but this has significantly worsened over the past 4 days. Weight has increased by ~ 20 lbs  since April. No anginal symptoms.  BNP elevated. Agree with IV diuresis, K repletion. Will follow.   ThTroy SineMD, FADodge [facility] Hospital/24/2014 9:48 AM

## 2013-01-11 NOTE — ED Notes (Signed)
Respiratory therapist at bedside.

## 2013-01-11 NOTE — Progress Notes (Signed)
RT was talking with patient and pt stated he wears 8-9L 02 via nasal cannula at home.  This flow is to high to be delivered through a [place].  Please address this before pt is discharged home.  Pt needs high flow delivery device, such as a Venturi mask or a non-rebreather.

## 2013-01-11 NOTE — H&P (Signed)
PULMONARY  / CRITICAL CARE MEDICINE  Name: [name] MRN: [id number] DOB: 08/22/1950    ADMISSION DATE:  01/11/2013  REFERRING MD :  EDP PRIMARY SERVICE: PCCM  CHIEF COMPLAINT:  SOB  BRIEF PATIENT DESCRIPTION: 62yo male with hx COPD (8L home O2), CHF, pulm HTN, psoriatic arthritis and associated ILD.  Admitted 9/24 with Acute on chronic resp failure in setting AECOPD and decompensated R heart failure.   SIGNIFICANT EVENTS / STUDIES:    LINES / TUBES: none  CULTURES: Sputum 9/24>>>  ANTIBIOTICS: Levaquin 9/24>>>  HISTORY OF PRESENT ILLNESS:  62yo male with hx severe COPD, ILD, CHF, pulm HTN presented 9/24 with 3 day hx worsening SOB, chest pressure, orthopnea, worsening BLE edema.  Does have mild non productive cough but this is baseline. No known fevers but did have significant chills this am per wife.  Has been sleeping in chair, tried to lie down in bed this am and became acutely worse. Denies hemoptysis, purulent sputum, headache, n/v/d.  Pt is DNR/DNI.  Hypoxic in ER with sats low 80's on venti mask.   PAST MEDICAL HISTORY :  Past Medical History  Diagnosis Date  . Pulmonary hypertension     Echocardiogram evidence suggest 70 mmHg; by Right Heart Cath pressures are 55-60 mmHg peak  . Thrombocytopenia, unspecified   . Chronic pulmonary heart disease, unspecified     On home oxygen  . Peripheral vascular disease, unspecified     ABI is 87 on the right and 100 on the left. ; Followed by Dr. Shanon Rosser.  CTA abdomen-pelvis @ [facility] Severe aortoiliac disease; left common iliac occlusion severe right external iliac disease; severe SMA stenosis  . Other and unspecified hyperlipidemia   . Postinflammatory pulmonary fibrosis   . Other psoriasis   . Rheumatoid arthritis(714.0)   . Chronic respiratory failure   . Hypoxemia   . Chronic airway obstruction, not elsewhere classified   . Hyperlipidemia   . Perforation of colon 10/2001    After colonoscopy on 10/18/2001  . Internal  hemorrhoids   . Diverticulosis   . Shortness of breath   . Chronic right-sided HF (heart failure)     Right-sided  . Headache(784.0)   . Cancer skin  . 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 CAD (coronary artery disease), native coronary artery 2012    Cath @ [facility]) --  70% prox Cx, prox RCA 30%, ~20% LM, D1.   Past Surgical History  Procedure Laterality Date  . Repair of cecal perforation  10/2001  . Colon surgery  2003    Secondary to bowel perforation after colonoscopy  . Back surgery    . Tonsillectomy    . Doppler echocardiography  June 2012    Normal LV size and function. Grade 1 diastolic dysfunction. Mild LA pressures elevation. A Cordis septal motion. Mildly dilated RV with increased pressures. Moderate to severe TR with PA pressures estimated 70 mmHg. Dilated IVC with blunted changes equals further CP.  Marland Kitchen Persantine myoview  June 2012    No ischemia or infarction  . Left and right heart catheterization  2012    70% circumflex lesion, 20-30% RCA, Left Main, D1;  RBP 55/6 mmHg (12 mmHg), BAP 53/20 mmHg. PCWP 13/12 mmHg (10). RAP 10 mmHg. They'll mean 66 mmHg, 91/49 mmHg.     Prior to Admission medications   Medication Sig Start Date End Date Taking? Authorizing Provider  acetaminophen (TYLENOL ARTHRITIS PAIN) 650 MG CR tablet Take  1,300 mg by mouth 2 (two) times daily.    Yes Historical Provider, MD  adalimumab (HUMIRA) 40 MG/0.8ML injection Inject 40 mg into the skin every 14 (fourteen) days.    Yes Historical Provider, MD  albuterol (PROVENTIL) (2.5 MG/3ML) 0.083% nebulizer solution Take 3 mLs (2.5 mg total) by nebulization every 6 (six) hours as needed for wheezing. 06/21/12  Yes Collene Gobble, MD  ambrisentan (LETAIRIS) 10 MG tablet Take 1 tablet (10 mg total) by mouth daily. 08/01/12  Yes Tammy S Parrett, NP  b complex vitamins tablet Take 1 tablet by mouth daily.   Yes Historical Provider, MD  Calcipotriene-Betameth Diprop (TACLONEX EX) Apply 1 application  topically daily as needed.    Yes Historical Provider, MD  calcium-vitamin D (OSCAL) 250-125 MG-UNIT per tablet Take 1 tablet by mouth 2 (two) times daily.   Yes Historical Provider, MD  cetirizine (ZYRTEC) 10 MG tablet Take 10 mg by mouth daily.     Yes Historical Provider, MD  folic acid (FOLVITE) 1 MG tablet Take 1 mg by mouth daily.     Yes Historical Provider, MD  furosemide (LASIX) 80 MG tablet take1 to 2 tablets twice aday 12/13/12  Yes Leonie Man, MD  furosemide (LASIX) 80 MG tablet Take 80-120 mg by mouth 2 (two) times daily. Takes 141m in the morning on Monday Wednesday Friday and Sunday.  836mall other times   Yes Historical Provider, MD  Multiple Vitamin (MULTIVITAMIN) capsule Take 1 capsule by mouth daily.     Yes Historical Provider, MD  OXYGEN-HELIUM IN 8+ Liters continuous inhalation   Yes Historical Provider, MD  pantoprazole (PROTONIX) 40 MG tablet Take 40 mg by mouth daily.   Yes Historical Provider, MD  potassium chloride SA (K-DUR,KLOR-CON) 20 MEQ tablet Take 40 mEq by mouth 2 (two) times daily.   Yes Historical Provider, MD  predniSONE (DELTASONE) 10 MG tablet Take 20 mg by mouth daily. 07/27/12  Yes PeErick ColaceNP  simvastatin (ZOCOR) 20 MG tablet Take 20 mg by mouth at bedtime.    Yes Historical Provider, MD  tiotropium (SPIRIVA) 18 MCG inhalation capsule Place 1 capsule (18 mcg total) into inhaler and inhale daily. 06/30/12  Yes RoCollene GobbleMD  Treprostinil (TYVASO IN) Inhale 12 puffs into the lungs 4 (four) times daily.   Yes Historical Provider, MD  XARELTO 20 MG TABS Take 20 mg by mouth daily.  05/14/11  Yes Historical Provider, MD   No Known Allergies  FAMILY HISTORY:  Family History  Problem Relation Age of Onset  . Heart disease Mother   . Diabetes Mother   . Diabetes Brother    SOCIAL HISTORY:  reports that he quit smoking about 6 years ago. His smoking use included Cigarettes. He has a 84 pack-year smoking history. He has never used smokeless  tobacco. He reports that he drinks about 0.6 ounces of alcohol per week. He reports that he does not use illicit drugs.  REVIEW OF SYSTEMS:  As per HPI.  All other systems reviewed and were neg.    VITAL SIGNS: Temp:  [97.5 F (36.4 C)-98.1 F (36.7 C)] 98.1 F (36.7 C) (09/24 0743) Pulse Rate:  [75-84] 75 (09/24 0743) Resp:  [18-26] 26 (09/24 0743) BP: (118-128)/(68-78) 118/69 mmHg (09/24 0743) SpO2:  [81 %-96 %] 91 % (09/24 0917) Weight:  [212 lb (96.163 kg)] 212 lb (96.163 kg) (09/24 0715) HEMODYNAMICS:   VENTILATOR SETTINGS:   INTAKE / OUTPUT: Intake/Output   None  PHYSICAL EXAMINATION: General:  Chronically ill appearing male, mild distress Neuro:  Awake, alert, appropriate, MAE HEENT:  Mm dry, no JVD, face ruddy (normal per wife) Cardiovascular:  s1s2 rrr Lungs:  resps even mildly labored on venti mask, coarse crackles throughout Abdomen:  Distended, hypoactive bs Musculoskeletal:  Warm and dry, 2+ pitting edema BLE  LABS:  CBC Recent Labs     01/11/13  0730  WBC  10.9*  HGB  16.1  HCT  53.2*  PLT  137*   Coag's No results found for this basename: APTT, INR,  in the last 72 hours BMET Recent Labs     01/11/13  0730  NA  140  K  3.4*  CL  101  CO2  29  BUN  19  CREATININE  1.13  GLUCOSE  101*   Electrolytes Recent Labs     01/11/13  0730  CALCIUM  8.7   Sepsis Markers No results found for this basename: LACTICACIDVEN, PROCALCITON, O2SATVEN,  in the last 72 hours ABG Recent Labs     01/11/13  0810  PHART  7.398  PCO2ART  46.7*  PO2ART  153.0*   Liver Enzymes No results found for this basename: AST, ALT, ALKPHOS, BILITOT, ALBUMIN,  in the last 72 hours Cardiac Enzymes Recent Labs     01/11/13  0730  TROPONINI  <0.30  PROBNP  3900.0*   Glucose No results found for this basename: GLUCAP,  in the last 72 hours  Imaging Dg Chest Portable 1 View  01/11/2013   CLINICAL DATA:  Shortness of breath, congestive heart failure  EXAM:  PORTABLE CHEST - 1 VIEW  COMPARISON:  Portable chest x-ray of 07/26/2012  FINDINGS: There is little change in diffuse airspace disease and possible small effusions. Cardiomegaly is stable. Some of the interstitial prominence could be chronic in nature.  IMPRESSION: Cardiomegaly and probable pulmonary vascular congestion possibly superimposed on chronic interstitial change.   Electronically Signed   By: Ivar Drape M.D.   On: 01/11/2013 07:59     ASSESSMENT / PLAN:  PULMONARY Acute on chronic respiratory failure  AECOPD ILD - r/t psoriatic arthritis Likely [ulm edema component, in setting pulm hytn P:   Cont O2  Low dose solumedrol (chronic prednisone 45m daily) Levaquin, low threshold add ceftaz  / vanc if fevers autoset CPAP - on 4 hours, off 2 - would not use bipap (oxygenation is the issue and sensitive to the pressure)  F/u CXR  Diuresis per cards  BD  pulm hygiene  No ABG  DNR/DNI - re confirmed wishes with wife in room  CARDIOVASCULAR Acute on chronic R sided heart failure  Severe Pulm HTN - pressures in 70's CAD Cor Pulmonale  P:  Diuresis per cards  Had recent echo, will hold off on repeat for now  Intermittent f/u BNP  Cardiology following ([place]) Cont tyvaso, letairis   RENAL Hypokalemia - mild Chronic renal insufficiency  P:   F/u chem  Replace K as needed  Monitor renal function with aggressive diuresis   GASTROINTESTINAL Abd distension - likely r/t volume overload  P:   Monitor  Clear liquids for now  HEMATOLOGIC Coagulopathy - on xarelto for hx DVT  P:  F/u cbc  Cont home xarelto   INFECTIOUS AECOPD  P:   Levaquin as above  Sputum culture  Hold humira   ENDOCRINE No active issue P:   Monitor glucose on chem with steroids   NEUROLOGIC No active issue P:  Monitor   Discussed with patient and wife at bedside who confirmed DNR/DNI.  He is ok with intermittent CPAP.  Will admit to SDU with aggressive diuresis, abx, BD's, steroids.  Suspect this is all mostly driven by volume overload. Cards following.    I have personally obtained a history, examined the patient, evaluated laboratory and imaging results, formulated the assessment and plan and placed orders. CRITICAL CARE: The patient is critically ill with multiple organ systems failure and requires high complexity decision making for assessment and support, frequent evaluation and titration of therapies, application of advanced monitoring technologies and extensive interpretation of multiple databases. Critical Care Time devoted to patient care services described in this note is 30 minutes.   Lavon Paganini. Titus Mould, MD, FACP Pgr: [place], NP 01/11/2013  9:42 AM Pager: [phone] or [phone]  *Care during the described time interval was provided by me and/or other providers on the critical care team. I have reviewed this patient's available data, including medical history, events of note, physical examination and test results as part of my evaluation.

## 2013-01-11 NOTE — Care Management Note (Signed)
    Page 1 of 1   01/11/2013     4:04:45 PM   CARE MANAGEMENT NOTE 01/11/2013  Patient:  [name], [name]   Account Number:  [id number]  Date Initiated:  01/11/2013  Documentation initiated by:  Elissa Hefty  Subjective/Objective Assessment:   adm w resp failure     Action/Plan:   lives w wife, pcp dr Banker, has o2 w [employer], hx of hhx w [facility].   Anticipated DC Date:     Anticipated DC Plan:        DC Planning Services  CM consult      Choice offered to / List presented to:             Status of service:   Medicare Important Message given?   (If response is "NO", the following Medicare IM given date fields will be blank) Date Medicare IM given:   Date Additional Medicare IM given:    Discharge Disposition:    Per UR Regulation:  Reviewed for med. necessity/level of care/duration of stay  If discussed at [place] of Stay Meetings, dates discussed:    Comments:

## 2013-01-11 NOTE — ED Notes (Signed)
Report called to [facility]

## 2013-01-11 NOTE — ED Provider Notes (Signed)
CSN: [id number]     Arrival date & time 01/11/13  0707 History   First MD Initiated Contact with Patient 01/11/13 [phone]     Chief Complaint  Patient presents with  . Congestive Heart Failure  . Shortness of Breath   (Consider location/radiation/quality/duration/timing/severity/associated sxs/prior Treatment) HPI  62 year old male with history of pulmonary hypertension, COPD, currently on home O2 was brought here via EMS from home for evaluations of shortness of breath.  Patient is currently DO NOT RESUSCITATE. Patient states for the past 3 days he has had increased shortness of breath. Symptoms worsen with exertion. Endorse intermittent chest pressure sensation lasting for minutes to hours. Endorse chills and weight gain from fluid retention. Unable to sleep laying flat but this is not new. Has been using 8 L of supplemental oxygen at home at baseline with minimal improvement. Has been using his home oxygen therapy including albuterol nebs and steroid with minimal improvement.  Reports chills and nonproductive cough. Denies fever, headache, runny nose, sore throat, nausea, vomiting, diarrhea, abdominal pain, dysuria, numbness or weakness, rash. Last hospitalization in March according to patient.   Past Medical History  Diagnosis Date  . Pulmonary hypertension     Echocardiogram evidence suggest 70 mmHg; by Right Heart Cath pressures are 55-60 mmHg peak  . Thrombocytopenia, unspecified   . Chronic pulmonary heart disease, unspecified     On home oxygen  . Peripheral vascular disease, unspecified     ABI is 87 on the right and 100 on the left. ; Followed by Dr. Shanon Rosser.  CTA abdomen-pelvis @ [facility] Severe aortoiliac disease; left common iliac occlusion severe right external iliac disease; severe SMA stenosis  . Other and unspecified hyperlipidemia   . Postinflammatory pulmonary fibrosis   . Other psoriasis   . Rheumatoid arthritis(714.0)   . Chronic respiratory failure   . Hypoxemia   .  Chronic airway obstruction, not elsewhere classified   . Hyperlipidemia   . Perforation of colon 10/2001    After colonoscopy on 10/18/2001  . Internal hemorrhoids   . Diverticulosis   . Shortness of breath   . Chronic right-sided HF (heart failure)     Right-sided  . Headache(784.0)   . Cancer skin  . 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 CAD (coronary artery disease), native coronary artery 2012    Cath @ [facility]) --  70% prox Cx, prox RCA 30%, ~20% LM, D1.   Past Surgical History  Procedure Laterality Date  . Repair of cecal perforation  10/2001  . Colon surgery  2003    Secondary to bowel perforation after colonoscopy  . Back surgery    . Tonsillectomy    . Doppler echocardiography  June 2012    Normal LV size and function. Grade 1 diastolic dysfunction. Mild LA pressures elevation. A Cordis septal motion. Mildly dilated RV with increased pressures. Moderate to severe TR with PA pressures estimated 70 mmHg. Dilated IVC with blunted changes equals further CP.  Marland Kitchen Persantine myoview  June 2012    No ischemia or infarction  . Left and right heart catheterization  2012    70% circumflex lesion, 20-30% RCA, Left Main, D1;  RBP 55/6 mmHg (12 mmHg), BAP 53/20 mmHg. PCWP 13/12 mmHg (10). RAP 10 mmHg. They'll mean 66 mmHg, 91/49 mmHg.     Family History  Problem Relation Age of Onset  . Heart disease Mother   . Diabetes Mother   . Diabetes Brother  History  Substance Use Topics  . Smoking status: Former Smoker -- 2.00 packs/day for 42 years    Types: Cigarettes    Quit date: 04/20/2006  . Smokeless tobacco: Never Used  . Alcohol Use: 0.6 oz/week    1 Cans of beer per week    Review of Systems  Constitutional: Positive for chills. Negative for fever.  Respiratory: Positive for cough, chest tightness and shortness of breath.   Cardiovascular: Positive for leg swelling. Negative for chest pain.  Gastrointestinal: Positive for abdominal distention. Negative for  abdominal pain.  Skin: Negative for rash.  All other systems reviewed and are negative.    Allergies  Review of patient's allergies indicates no known allergies.  Home Medications   Current Outpatient Rx  Name  Route  Sig  Dispense  Refill  . acetaminophen (TYLENOL ARTHRITIS PAIN) 650 MG CR tablet   Oral   Take 1,300 mg by mouth 2 (two) times daily.          Marland Kitchen adalimumab (HUMIRA) 40 MG/0.8ML injection   Subcutaneous   Inject 40 mg into the skin every 14 (fourteen) days.          Marland Kitchen albuterol (PROVENTIL) (2.5 MG/3ML) 0.083% nebulizer solution   Nebulization   Take 3 mLs (2.5 mg total) by nebulization every 6 (six) hours as needed for wheezing.   360 mL   3   . ambrisentan (LETAIRIS) 10 MG tablet   Oral   Take 1 tablet (10 mg total) by mouth daily.   30 tablet   5   . b complex vitamins tablet   Oral   Take 1 tablet by mouth daily.         . Calcipotriene-Betameth Diprop (TACLONEX EX)   Apply externally   Apply 1 application topically daily as needed.          . calcium-vitamin D (OSCAL) 250-125 MG-UNIT per tablet   Oral   Take 1 tablet by mouth 2 (two) times daily.         . cetirizine (ZYRTEC) 10 MG tablet   Oral   Take 10 mg by mouth daily.           . folic acid (FOLVITE) 1 MG tablet   Oral   Take 1 mg by mouth daily.           . furosemide (LASIX) 80 MG tablet      take1 to 2 tablets twice aday   360 tablet   3   . guaiFENesin (MUCINEX) 600 MG 12 hr tablet   Oral   Take 600 mg by mouth 2 (two) times daily.          Marland Kitchen KLOR-CON M20 20 MEQ tablet      TAKE 2 TABLETS BY MOUTH TWICE A DAY   360 tablet   0   . Multiple Vitamin (MULTIVITAMIN) capsule   Oral   Take 1 capsule by mouth daily.           . OXYGEN-HELIUM IN      8+ Liters continuous inhalation         . pantoprazole (PROTONIX) 40 MG tablet   Oral   Take 40 mg by mouth daily.         . predniSONE (DELTASONE) 10 MG tablet   Oral   Take 20 mg by mouth daily.          . simvastatin (ZOCOR) 20 MG tablet   Oral   Take  20 mg by mouth at bedtime.          Marland Kitchen tiotropium (SPIRIVA) 18 MCG inhalation capsule   Inhalation   Place 1 capsule (18 mcg total) into inhaler and inhale daily.   90 capsule   6   . Treprostinil (TYVASO IN)   Inhalation   Inhale 12 puffs into the lungs 4 (four) times daily.         Alveda Reasons 20 MG TABS   Oral   Take 20 mg by mouth daily.           BP 128/78  Pulse 84  Resp 20  SpO2 96% Physical Exam  Nursing note and vitals reviewed. Constitutional:  Chronically ill appearing male in moderate respiratory distress.  HENT:  Head: Normocephalic and atraumatic.  Mouth dry  Eyes: Conjunctivae are normal.  Neck: Neck supple. JVD (JVD noted) present.  Cardiovascular: Normal rate and regular rhythm.   Pulmonary/Chest: He is in respiratory distress (Decreased breath sounds with crackles heard at both lung bases without obvious wheezes or rhonchi.).  Abdominal: He exhibits distension (Abdomen is distended, but nontender on palpation).  Musculoskeletal: He exhibits edema (2+ pitting edema bilatterally to lower extremities).  Neurological: He is alert.  Skin: Skin is warm.  Chronic grey-ish skin tone to face  Psychiatric: He has a normal mood and affect.    ED Course  Procedures (including critical care time)   Date: 01/11/2013  Rate: 81  Rhythm: normal sinus rhythm  QRS Axis: right  Intervals: PR prolonged  ST/T Wave abnormalities: nonspecific ST/T changes  Conduction Disutrbances:first-degree A-V block   Narrative Interpretation:   Old EKG Reviewed: unchanged    7:31 AM Patient with a significant history of lung disease. He is a DO NOT RESUSCITATE and DO NOT INTUBATE. He is here with shortness of breath. He is in moderate respiratory distress, however able to [facility] and answer all questions appropriately speaking 3-5 words per sentence. Patient refused BiPAP or intubation. Care discussed with attending.  Will place patient on nonrebreather. Cardiac workup initiated. Lasix given due to history of CHF. Troponin ordered. His EKG reviewed by me shows no acute ischemic changes.  7:54 AM Care discussed with [facility], who agrees to evaluate pt.  Will continue with respiratory care and close monitoring.  Albuterol nebs, steroid given.  Will continue with Lasix.  Pt currently sats at 97% on nonrebreather, and appears more comfortable  8:06 AM CXR with increase evidence of vascular congestions but without obvious focal infiltrates to suggest PNA.  i have consulted pulmonary/critical care specialist, Dr. Titus Mould who agrees to see pt in ER and will admit for further care.    Labs Review Labs Reviewed  BASIC METABOLIC PANEL - Abnormal; Notable for the following:    Potassium 3.4 (*)    Glucose, Bld 101 (*)    GFR calc non Af Amer 67 (*)    GFR calc Af Amer 78 (*)    All other components within normal limits  CBC WITH DIFFERENTIAL - Abnormal; Notable for the following:    WBC 10.9 (*)    HCT 53.2 (*)    RDW 17.1 (*)    Platelets 137 (*)    All other components within normal limits  PRO B NATRIURETIC PEPTIDE - Abnormal; Notable for the following:    Pro B Natriuretic peptide (BNP) 3900.0 (*)    All other components within normal limits  POCT I-STAT 3, BLOOD GAS (G3+) - Abnormal; Notable for the following:  pCO2 arterial 46.7 (*)    pO2, Arterial 153.0 (*)    Bicarbonate 28.9 (*)    Acid-Base Excess 3.0 (*)    All other components within normal limits  TROPONIN I  BLOOD GAS, ARTERIAL   Imaging Review Dg Chest Portable 1 View  01/11/2013   CLINICAL DATA:  Shortness of breath, congestive heart failure  EXAM: PORTABLE CHEST - 1 VIEW  COMPARISON:  Portable chest x-ray of 07/26/2012  FINDINGS: There is little change in diffuse airspace disease and possible small effusions. Cardiomegaly is stable. Some of the interstitial prominence could be chronic in nature.  IMPRESSION: Cardiomegaly and probable  pulmonary vascular congestion possibly superimposed on chronic interstitial change.   Electronically Signed   By: Ivar Drape M.D.   On: 01/11/2013 07:59    MDM   1. Chronic right-sided HF (heart failure)   2. Acute and chronic respiratory failure   3. Hypoxemia    BP 118/69  Pulse 75  Temp(Src) 98.1 F (36.7 C) (Oral)  Resp 26  Ht _0  (1.727 m)  Wt 212 lb (96.163 kg)  BMI 32.24 kg/m2  SpO2 91%  I have reviewed nursing notes and vital signs. I personally reviewed the imaging tests through PACS system  I reviewed available ER/hospitalization records thought the EMR     Domenic Moras, [place] 01/11/13 0831

## 2013-01-12 ENCOUNTER — Other Ambulatory Visit: Payer: Self-pay | Admitting: Emergency Medicine

## 2013-01-12 ENCOUNTER — Inpatient Hospital Stay (HOSPITAL_COMMUNITY): Payer: BC Managed Care – PPO

## 2013-01-12 DIAGNOSIS — I2781 Cor pulmonale (chronic): Secondary | ICD-10-CM | POA: Diagnosis present

## 2013-01-12 DIAGNOSIS — R0902 Hypoxemia: Secondary | ICD-10-CM

## 2013-01-12 LAB — BASIC METABOLIC PANEL
BUN: 19 mg/dL (ref 6–23)
CO2: 25 mEq/L (ref 19–32)
Chloride: 100 mEq/L (ref 96–112)
GFR calc non Af Amer: 68 mL/min — ABNORMAL LOW (ref >90)
Glucose, Bld: 153 mg/dL — ABNORMAL HIGH (ref 70–99)
Potassium: 4.8 mEq/L (ref 3.5–5.1)
Sodium: 137 mEq/L (ref 135–145)

## 2013-01-12 LAB — CBC
HCT: 49.5 % (ref 39.0–52.0)
Hemoglobin: 15.5 g/dL (ref 13.0–17.0)
MCHC: 31.3 g/dL (ref 30.0–36.0)
MCV: 91.5 fL (ref 78.0–100.0)

## 2013-01-12 LAB — PHOSPHORUS: Phosphorus: 4.3 mg/dL (ref 2.3–4.6)

## 2013-01-12 MED ORDER — FUROSEMIDE 10 MG/ML IJ SOLN
40.0000 mg | Freq: Two times a day (BID) | INTRAMUSCULAR | Status: DC
  Administered 2013-01-12 – 2013-01-13 (×3): 40 mg via INTRAVENOUS
  Filled 2013-01-12 (×4): qty 4

## 2013-01-12 MED ORDER — ZOLPIDEM TARTRATE 5 MG PO TABS: 5.0000 mg | ORAL_TABLET | Freq: Every evening | ORAL | Status: DC | PRN

## 2013-01-12 NOTE — Progress Notes (Signed)
[facility]  Subjective: He feels a bit better, but still using non rebreather.   Objective: Vital signs in last 24 hours: Temp:  [94.8 F (34.9 C)-98 F (36.7 C)] 98 F (36.7 C) (09/25 0800) Pulse Rate:  [77-95] 77 (09/25 0600) Resp:  [14-20] 15 (09/25 0600) BP: (107-135)/(61-87) 129/87 mmHg (09/25 0800) SpO2:  [86 %-97 %] 94 % (09/25 0828) Weight:  [208 lb 15.9 oz (94.8 kg)-209 lb 3.5 oz (94.9 kg)] 209 lb 3.5 oz (94.9 kg) (09/25 0500) Last BM Date: 01/10/13  Intake/Output from previous day: 09/24 0701 - 09/25 0700 In: 480 [P.O.:480] Out: 375 [Urine:375] Intake/Output this shift:    Medications Current Facility-Administered Medications  Medication Dose Route Frequency Provider Last Rate Last Dose  . 0.9 %  sodium chloride infusion  250 mL Intravenous PRN Marijean Heath, NP      . 0.9 %  sodium chloride infusion   Intravenous Continuous Marijean Heath, NP      . albuterol (PROVENTIL) (5 MG/ML) 0.5% nebulizer solution 2.5 mg  2.5 mg Nebulization Q6H Marijean Heath, NP   2.5 mg at 01/12/13 4034   And  . ipratropium (ATROVENT) nebulizer solution 0.5 mg  0.5 mg Nebulization Q6H Marijean Heath, NP   0.5 mg at 01/12/13 7425  . ambrisentan (LETAIRIS) tablet 10 mg  10 mg Oral Q breakfast Raylene Miyamoto, MD   10 mg at 01/11/13 2139  . B-complex with vitamin C tablet 1 tablet  1 tablet Oral Daily Raylene Miyamoto, MD   1 tablet at 01/11/13 2139  . calcium-vitamin D (OSCAL WITH D) 500-200 MG-UNIT per tablet 0.5 tablet  0.5 tablet Oral BID WC Raylene Miyamoto, MD   0.5 tablet at 01/11/13 2138  . folic acid (FOLVITE) tablet 1 mg  1 mg Oral Daily Marijean Heath, NP      . furosemide (LASIX) injection 40 mg  40 mg Intravenous BID Leonie Man, MD      . levofloxacin [facility]) IVPB 750 mg  750 mg Intravenous Q24H Marijean Heath, NP   750 mg at 01/11/13 1005  . loratadine (CLARITIN) tablet 10 mg  10 mg Oral Daily  Marijean Heath, NP      . methylPREDNISolone sodium succinate (SOLU-MEDROL) 40 mg/mL injection 40 mg  40 mg Intravenous Q12H Marijean Heath, NP   40 mg at 01/11/13 2139  . multivitamin with minerals tablet 1 tablet  1 tablet Oral Daily Raylene Miyamoto, MD   1 tablet at 01/11/13 2138  . pantoprazole (PROTONIX) EC tablet 40 mg  40 mg Oral Daily Marijean Heath, NP      . potassium chloride SA (K-DUR,KLOR-CON) CR tablet 40 mEq  40 mEq Oral BID Marijean Heath, NP   40 mEq at 01/11/13 2139  . Rivaroxaban (XARELTO) tablet 20 mg  20 mg Oral Q supper Raylene Miyamoto, MD   20 mg at 01/11/13 2139  . simvastatin (ZOCOR) tablet 20 mg  20 mg Oral q1800 Marijean Heath, NP      . Treprostinil (TYVASO) inhalation solution 18 mcg  18 mcg Inhalation Custom Raylene Miyamoto, MD        PE: General appearance: alert, cooperative and no distress, appears cyanotic  Neck: + JVD Lungs: bibasilar rales, L>R Heart: regular rate and rhythm, S1, S2 normal, no murmur, click, rub or gallop Extremities: 2+ pitting bilateral LEE Pulses: 2+ and symmetric Skin: warm and  dry Neurologic: Grossly normal  Lab Results:   Recent Labs  01/11/13 0730 01/12/13 0715  WBC 10.9* 10.3  HGB 16.1 15.5  HCT 53.2* 49.5  PLT 137* 152   BMET  Recent Labs  01/11/13 0730 01/12/13 0715  NA 140 137  K 3.4* 4.8  CL 101 100  CO2 29 25  GLUCOSE 101* 153*  BUN 19 19  CREATININE 1.13 1.12  CALCIUM 8.7 8.7   Cardiac Enzymes Cardiac Panel (last 3 results)  Recent Labs  01/11/13 0730  TROPONINI <0.30   BNP (last 3 results)  Recent Labs  07/18/12 0812 01/11/13 0730  PROBNP 3784.0* 3900.0*   Filed Weights   01/11/13 0715 01/11/13 1700 01/12/13 0500  Weight: 212 lb (96.163 kg) 208 lb 15.9 oz (94.8 kg) 209 lb 3.5 oz (94.9 kg)    Assessment/Plan  Principal Problem:   Acute and chronic respiratory failure Active Problems:   Chronic right-sided HF (heart failure)   Pulmonary  hypertension   Cor pulmonale, chronic - with acute on Chronic exacerbation   C O P D   PULMONARY FIBROSIS   Anasarca   CAD (coronary artery disease), native coronary artery   Hypoxemia   Hypokalemia  Plan: Pt states that he is feeling a bit better, but appears cyanotic. He is still with oxygen requirements. O2 sats at 94%. Still with evidence of volume-overload. His weight is down 3 lbs since admission. I doubt that I/Os are accurate. Order is in for strict I/Os. Continue daily weights. Continue IV Lasix for diuresis. BMP pending. Will need to follow renal function and electrolytes. Replete K+ if hypokalemic. HR and BP both stable. Consider rechecking BNP in 1-2 days. MD to follow.    LOS: 1 day   Brittainy M. Ladoris Gene 01/12/2013 9:02 AM  He is a very complicated gentleman with long-standing pulmonary history of chronic idiopathic pulmonary fibrosis as well as COPD. He has severe pulmonary hypertension with pressures in the 70s -- essentially Cor Pulmonale, on Tyvaso and Letairis.  He also has severe Aorto-Iliac PAD that was so severe insignificant as far as his aortoiliac disease that precluded him from undergoing lung transplant at [facility]. He only has moderate CAD with a 70% lesion the circumflex.  Admitted yesterday with increased fluid overload & dyspnea. Felt somewhat better after 1 dose IV Lasix, but no further lasix was ordered.  He has 3++ edema / anasarca & CXR that does appear to me to be more c/w pulmonary edema as well as his chronic fibrosis.  Lasix 40 mg IV BID  Recheck proBNP in AM.  ? Would we gain any pertinent information from re-peat RHC  No angina - despite hypoxemia would argue against an LV ischemic component.  Leonie Man, M.D., M.S. [facility] [address] [place], [place]  [address]  [phone] Pager # [phone] 01/12/2013 9:02 AM

## 2013-01-12 NOTE — Progress Notes (Signed)
PULMONARY  / CRITICAL CARE MEDICINE  Name: [name] MRN: [id number] DOB: 03/03/1951    ADMISSION DATE:  01/11/2013  REFERRING MD :  EDP PRIMARY SERVICE: PCCM  CHIEF COMPLAINT:  SOB  BRIEF PATIENT DESCRIPTION: 62yo male with hx COPD (8L home O2), CHF, pulm HTN, psoriatic arthritis and associated ILD.  Admitted 9/24 with Acute on chronic resp failure in setting AECOPD and decompensated R heart failure.   SIGNIFICANT EVENTS / STUDIES:    LINES / TUBES: none  CULTURES: Sputum 9/24>>>  ANTIBIOTICS: Levaquin 9/24>>>  VITAL SIGNS: Temp:  [94.8 F (34.9 C)-98 F (36.7 C)] 98 F (36.7 C) (09/25 0800) Pulse Rate:  [74-90] 81 (09/25 1000) Resp:  [14-22] 22 (09/25 1000) BP: (90-135)/(45-87) 90/45 mmHg (09/25 1000) SpO2:  [86 %-97 %] 87 % (09/25 1000) Weight:  [94.8 kg (208 lb 15.9 oz)-94.9 kg (209 lb 3.5 oz)] 94.9 kg (209 lb 3.5 oz) (09/25 0500) HEMODYNAMICS:   VENTILATOR SETTINGS:   INTAKE / OUTPUT: Intake/Output     09/24 0701 - 09/25 0700 09/25 0701 - 09/26 0700   P.O. 480 360   IV Piggyback  300   Total Intake(mL/kg) 480 (5.1) 660 (7)   Urine (mL/kg/hr) 375 700 (1.5)   Total Output 375 700   Net +105 -40        Urine Occurrence 1 x     PHYSICAL EXAMINATION: General:  Chronically ill appearing male, mild distress Neuro:  Awake, alert, appropriate, MAE HEENT:  Mm dry, no JVD, face ruddy (normal per wife) Cardiovascular:  s1s2 rrr Lungs:  resps even mildly labored on venti mask, coarse crackles throughout Abdomen:  Distended, hypoactive bs Musculoskeletal:  Warm and dry, 2+ pitting edema BLE  LABS:  CBC Recent Labs     01/11/13  0730  01/12/13  0715  WBC  10.9*  10.3  HGB  16.1  15.5  HCT  53.2*  49.5  PLT  137*  152   Coag's No results found for this basename: APTT, INR,  in the last 72 hours BMET Recent Labs     01/11/13  0730  01/12/13  0715  NA  140  137  K  3.4*  4.8  CL  101  100  CO2  29  25  BUN  19  19  CREATININE  1.13  1.12   GLUCOSE  101*  153*   Electrolytes Recent Labs     01/11/13  0730  01/12/13  0715  CALCIUM  8.7  8.7  MG   --   1.8  PHOS   --   4.3   Sepsis Markers No results found for this basename: LACTICACIDVEN, PROCALCITON, O2SATVEN,  in the last 72 hours ABG Recent Labs     01/11/13  0810  PHART  7.398  PCO2ART  46.7*  PO2ART  153.0*   Liver Enzymes No results found for this basename: AST, ALT, ALKPHOS, BILITOT, ALBUMIN,  in the last 72 hours Cardiac Enzymes Recent Labs     01/11/13  0730  TROPONINI  <0.30  PROBNP  3900.0*   Glucose No results found for this basename: GLUCAP,  in the last 72 hours  Imaging Dg Chest Port 1 View  01/12/2013   CLINICAL DATA:  Shortness of breath and pulmonary edema  EXAM: PORTABLE CHEST - 1 VIEW  COMPARISON:  01/11/2013  FINDINGS: Reticular and airspace opacities are slightly decreased. Less cephalized pulmonary blood flow. Small pleural effusions. Stable cardiomegaly. Enlarged main pulmonary artery, consistent  with hypertension.  IMPRESSION: Improving pulmonary edema. Small bilateral pleural effusions.   Electronically Signed   By: Jorje Guild   On: 01/12/2013 05:52   Dg Chest Portable 1 View  01/11/2013   CLINICAL DATA:  Shortness of breath, congestive heart failure  EXAM: PORTABLE CHEST - 1 VIEW  COMPARISON:  Portable chest x-ray of 07/26/2012  FINDINGS: There is little change in diffuse airspace disease and possible small effusions. Cardiomegaly is stable. Some of the interstitial prominence could be chronic in nature.  IMPRESSION: Cardiomegaly and probable pulmonary vascular congestion possibly superimposed on chronic interstitial change.   Electronically Signed   By: Ivar Drape M.D.   On: 01/11/2013 07:59     ASSESSMENT / PLAN:  PULMONARY Acute on chronic respiratory failure  AECOPD ILD - r/t psoriatic arthritis Likely [ulm edema component, in setting pulm hytn P:   - Cont O2 and titrate as needed. - Low dose solumedrol (chronic  prednisone 37m daily). - Continue levaquin, no evidence of active infection to start HCAP coverage, if more fever or significant elevation in WBC then will reconsider. - Autoset CPAP - as tolerated for comfort. - Diuresis as BP allows. - BD. - Pulm hygiene. - DNR/DNI.  CARDIOVASCULAR Acute on chronic R sided heart failure  Severe Pulm HTN - pressures in 70's CAD Cor Pulmonale  P:  - Diuresis per cards. - Defer to cardiology if an echo is necessary. - Cardiology following. - Cont tyvaso, letairis.  RENAL Hypokalemia - resolved Chronic renal insufficiency  P:   - F/u chem. - Replace K as needed. - Monitor renal function with aggressive diuresis.  GASTROINTESTINAL Abd distension - likely r/t volume overload  P:   - Monitor. - Change to heart healthy diet.  HEMATOLOGIC Coagulopathy - on xarelto for hx DVT  P:  - F/u cbc. - Cont home xarelto.  INFECTIOUS AECOPD  P:   - Levaquin as above. - Sputum culture. - Hold humira until abx course is complete.  ENDOCRINE No active issue P:   - Monitor glucose on chem with steroids.  NEUROLOGIC No active issue P:   - Monitor.  I have personally obtained a history, examined the patient, evaluated laboratory and imaging results, formulated the assessment and plan and placed orders.  WRush Farmer M.D. LFill[facility] CenterPulmonary/Critical Care Medicine. Pager: [phone] After hours pager: [phone]

## 2013-01-12 NOTE — Progress Notes (Signed)
RT just received call from RN that patient has already taken self off the CPAP machine. Patient says the machine is drying him out and he cannot tolerate it anymore tonight. Patient barely wore the machine around 20 min. Patient says he does not want to wear the machine anymore tonight but may try again tomorrow night. RT will continue to assist as needed.

## 2013-01-13 ENCOUNTER — Inpatient Hospital Stay (HOSPITAL_COMMUNITY): Payer: BC Managed Care – PPO

## 2013-01-13 DIAGNOSIS — I279 Pulmonary heart disease, unspecified: Secondary | ICD-10-CM

## 2013-01-13 LAB — CBC
HCT: 53.1 % — ABNORMAL HIGH (ref 39.0–52.0)
Hemoglobin: 16.3 g/dL (ref 13.0–17.0)
MCH: 28.3 pg (ref 26.0–34.0)
MCHC: 30.7 g/dL (ref 30.0–36.0)
MCV: 92.3 fL (ref 78.0–100.0)
Platelets: 171 10*3/uL (ref 150–400)
RBC: 5.75 MIL/uL (ref 4.22–5.81)
RDW: 17.1 % — ABNORMAL HIGH (ref 11.5–15.5)
WBC: 15 10*3/uL — ABNORMAL HIGH (ref 4.0–10.5)

## 2013-01-13 LAB — BASIC METABOLIC PANEL
BUN: 25 mg/dL — ABNORMAL HIGH (ref 6–23)
CO2: 25 mEq/L (ref 19–32)
Calcium: 9.4 mg/dL (ref 8.4–10.5)
Chloride: 98 mEq/L (ref 96–112)
Creatinine, Ser: 1.33 mg/dL (ref 0.50–1.35)
GFR calc Af Amer: 64 mL/min — ABNORMAL LOW (ref >90)
GFR calc non Af Amer: 55 mL/min — ABNORMAL LOW (ref >90)
Glucose, Bld: 196 mg/dL — ABNORMAL HIGH (ref 70–99)
Potassium: 5 mEq/L (ref 3.5–5.1)
Sodium: 138 mEq/L (ref 135–145)

## 2013-01-13 LAB — PHOSPHORUS: Phosphorus: 4.1 mg/dL (ref 2.3–4.6)

## 2013-01-13 LAB — PRO B NATRIURETIC PEPTIDE: Pro B Natriuretic peptide (BNP): 5513 pg/mL — ABNORMAL HIGH (ref 0–125)

## 2013-01-13 MED ORDER — LEVOFLOXACIN 750 MG PO TABS
750.0000 mg | ORAL_TABLET | Freq: Every day | ORAL | Status: DC
  Administered 2013-01-14 – 2013-01-19 (×6): 750 mg via ORAL
  Filled 2013-01-13 (×6): qty 1

## 2013-01-13 MED ORDER — FUROSEMIDE 10 MG/ML IJ SOLN
40.0000 mg | Freq: Three times a day (TID) | INTRAMUSCULAR | Status: DC
  Administered 2013-01-13 – 2013-01-16 (×11): 40 mg via INTRAVENOUS
  Filled 2013-01-13 (×12): qty 4

## 2013-01-13 MED ORDER — TREPROSTINIL 0.6 MG/ML IN SOLN
18.0000 ug | RESPIRATORY_TRACT | Status: DC
  Administered 2013-01-13 – 2013-01-15 (×8): 18 ug via RESPIRATORY_TRACT
  Filled 2013-01-13 (×3): qty 0.03

## 2013-01-13 MED ORDER — POTASSIUM CHLORIDE CRYS ER 20 MEQ PO TBCR
40.0000 meq | EXTENDED_RELEASE_TABLET | Freq: Every day | ORAL | Status: DC
  Administered 2013-01-13: 40 meq via ORAL

## 2013-01-13 NOTE — Progress Notes (Signed)
[facility]  Subjective: He is feeling a bit better, but is still with O2 requirements.   Objective: Vital signs in last 24 hours: Temp:  [97.3 F (36.3 C)-98 F (36.7 C)] 97.6 F (36.4 C) (09/26 0750) Pulse Rate:  [35-94] 88 (09/26 0735) Resp:  [15-25] 18 (09/26 0750) BP: (89-118)/(25-78) 100/48 mmHg (09/26 0342) SpO2:  [80 %-100 %] 96 % (09/26 0735) FiO2 (%):  [50 %] 50 % (09/26 0600) Weight:  [207 lb 6.4 oz (94.076 kg)] 207 lb 6.4 oz (94.076 kg) (09/26 0302) Last BM Date: 01/10/13  Intake/Output from previous day: 09/25 0701 - 09/26 0700 In: 1160 [P.O.:860; IV Piggyback:300] Out: 3050 [Urine:3050] Intake/Output this shift:    Medications Current Facility-Administered Medications  Medication Dose Route Frequency Provider Last Rate Last Dose  . 0.9 %  sodium chloride infusion  250 mL Intravenous PRN Marijean Heath, NP      . 0.9 %  sodium chloride infusion   Intravenous Continuous Marijean Heath, NP      . albuterol (PROVENTIL) (5 MG/ML) 0.5% nebulizer solution 2.5 mg  2.5 mg Nebulization Q6H Marijean Heath, NP   2.5 mg at 01/13/13 0735   And  . ipratropium (ATROVENT) nebulizer solution 0.5 mg  0.5 mg Nebulization Q6H Marijean Heath, NP   0.5 mg at 01/13/13 0735  . ambrisentan (LETAIRIS) tablet 10 mg  10 mg Oral Q breakfast Raylene Miyamoto, MD   10 mg at 01/12/13 0936  . B-complex with vitamin C tablet 1 tablet  1 tablet Oral Daily Raylene Miyamoto, MD   1 tablet at 01/12/13 0941  . calcium-vitamin D (OSCAL WITH D) 500-200 MG-UNIT per tablet 0.5 tablet  0.5 tablet Oral BID WC Raylene Miyamoto, MD   0.5 tablet at 01/12/13 1737  . folic acid (FOLVITE) tablet 1 mg  1 mg Oral Daily Marijean Heath, NP   1 mg at 01/12/13 0940  . furosemide (LASIX) injection 40 mg  40 mg Intravenous BID Leonie Man, MD   40 mg at 01/12/13 1737  . levofloxacin (LEVAQUIN) IVPB 750 mg  750 mg Intravenous Q24H Marijean Heath,  NP 100 mL/hr at 01/12/13 0949 750 mg at 01/12/13 0949  . loratadine (CLARITIN) tablet 10 mg  10 mg Oral Daily Marijean Heath, NP   10 mg at 01/12/13 0940  . methylPREDNISolone sodium succinate (SOLU-MEDROL) 40 mg/mL injection 40 mg  40 mg Intravenous Q12H Marijean Heath, NP   40 mg at 01/12/13 2105  . multivitamin with minerals tablet 1 tablet  1 tablet Oral Daily Raylene Miyamoto, MD   1 tablet at 01/12/13 0941  . pantoprazole (PROTONIX) EC tablet 40 mg  40 mg Oral Daily Marijean Heath, NP   40 mg at 01/12/13 0949  . potassium chloride SA (K-DUR,KLOR-CON) CR tablet 40 mEq  40 mEq Oral BID Marijean Heath, NP   40 mEq at 01/12/13 2105  . Rivaroxaban (XARELTO) tablet 20 mg  20 mg Oral Q supper Raylene Miyamoto, MD   20 mg at 01/12/13 1737  . simvastatin (ZOCOR) tablet 20 mg  20 mg Oral q1800 Marijean Heath, NP   20 mg at 01/12/13 1737  . Treprostinil (TYVASO) inhalation solution 18 mcg  18 mcg Inhalation Custom Raylene Miyamoto, MD      . zolpidem (AMBIEN) tablet 5 mg  5 mg Oral QHS PRN Rush Farmer, MD  PE: General appearance: alert, cooperative, cyanotic and no distress Neck: + JVD Lungs: rales bibasilar Heart: regular rate and rhythm Abdomen: abnormal findings:  Ascites Extremities: 2+ bilateral LEE Pulses: 2+ and symmetric Skin: warm and dry Neurologic: Grossly normal  Lab Results:   Recent Labs  01/11/13 0730 01/12/13 0715 01/13/13 0540  WBC 10.9* 10.3 15.0*  HGB 16.1 15.5 16.3  HCT 53.2* 49.5 53.1*  PLT 137* 152 171   BMET  Recent Labs  01/11/13 0730 01/12/13 0715 01/13/13 0540  NA 140 137 138  K 3.4* 4.8 5.0  CL 101 100 98  CO2 _0 GLUCOSE 101* 153* 196*  BUN 19 19 25*  CREATININE 1.13 1.12 1.33  CALCIUM 8.7 8.7 9.4   BNP (last 3 results)  Recent Labs  07/18/12 0812 01/11/13 0730 01/13/13 0540  PROBNP 3784.0* 3900.0* 5513.0*   Filed Weights   01/11/13 1700 01/12/13 0500 01/13/13 0302  Weight: 208 lb  15.9 oz (94.8 kg) 209 lb 3.5 oz (94.9 kg) 207 lb 6.4 oz (94.076 kg)     Assessment/Plan  Principal Problem:   Acute and chronic respiratory failure Active Problems:   C O P D   PULMONARY FIBROSIS   Hypoxemia   Chronic right-sided HF (heart failure)   Pulmonary hypertension   Anasarca   CAD (coronary artery disease), native coronary artery   Hypokalemia   Cor pulmonale, chronic - with acute on Chronic exacerbation  Plan: Pt has diuresed 1.8 L since admission. His weight is down 5 lb, from 212 to 207. Dry weight ~191.  His BNP is actually increased from admission BNP, at 5,513 (3,900 on admission). SCr is 1.33. He would benefit from increased doses of diuretic, however he is limited by soft BP. Most recent SBP is 100. Would recommend continuing with 40 mg this am, then reassess BP in the PM and see if he may tolerate a higher dose. Electrolytes are WNL. Will continue to monitor. MD to follow with additional recommendations.    LOS: 2 days    Brittainy M. Ladoris Gene 01/13/2013 8:33 AM   Patient seen and examined. Agree with assessment and plan. Still with dyspnea and cough. Sinus rhythm with frequent PVC's and transit trigeminy. BNP increased since admission despite diuresis. K increased to 5, will decrease supplementation to daily from bid. Will try increasing frequency of lasix to tid today.   Troy Sine, MD, [facility] 01/13/2013 9:01 AM

## 2013-01-13 NOTE — Progress Notes (Signed)
PULMONARY  / CRITICAL CARE MEDICINE  Name: ABRAN GAVIGAN MRN: [id number] DOB: [birth year]    ADMISSION DATE:  01/11/2013  REFERRING MD :  EDP PRIMARY SERVICE: PCCM  CHIEF COMPLAINT:  SOB  BRIEF PATIENT DESCRIPTION: 62yo male with hx COPD (8L home O2), CHF, pulm HTN, psoriatic arthritis and associated ILD.  Admitted 9/24 with Acute on chronic resp failure in setting AECOPD and decompensated R heart failure.   SIGNIFICANT EVENTS / STUDIES:    LINES / TUBES: none  CULTURES: Sputum 9/24>>>  ANTIBIOTICS: Levaquin 9/24>>>  VITAL SIGNS: Temp:  [97.3 F (36.3 C)-98 F (36.7 C)] 97.5 F (36.4 C) (09/26 1147) Pulse Rate:  [35-94] 88 (09/26 0750) Resp:  [15-25] 18 (09/26 0750) BP: (89-124)/(25-78) 124/54 mmHg (09/26 0750) SpO2:  [80 %-100 %] 99 % (09/26 0750) FiO2 (%):  [50 %] 50 % (09/26 0600) Weight:  [94.076 kg (207 lb 6.4 oz)] 94.076 kg (207 lb 6.4 oz) (09/26 0302) HEMODYNAMICS:   VENTILATOR SETTINGS: Vent Mode:  [-]  FiO2 (%):  [50 %] 50 % INTAKE / OUTPUT: Intake/Output     09/25 0701 - 09/26 0700 09/26 0701 - 09/27 0700   P.O. 860 120   IV Piggyback 300    Total Intake(mL/kg) 1160 (12.3) 120 (1.3)   Urine (mL/kg/hr) 3050 (1.4) 1200 (2.2)   Total Output 3050 1200   Net -1890 -1080         PHYSICAL EXAMINATION: General:  Chronically ill appearing male, mild distress Neuro:  Awake, alert, appropriate, MAE HEENT:  Mm dry, no JVD, face ruddy (normal per wife) Cardiovascular:  s1s2 rrr Lungs:  resps even mildly labored on venti mask, coarse crackles throughout Abdomen:  Distended, hypoactive bs Musculoskeletal:  Warm and dry, 2+ pitting edema BLE  LABS:  CBC Recent Labs     01/11/13  0730  01/12/13  0715  01/13/13  0540  WBC  10.9*  10.3  15.0*  HGB  16.1  15.5  16.3  HCT  53.2*  49.5  53.1*  PLT  137*  152  171   Coag's No results found for this basename: APTT, INR,  in the last 72 hours BMET Recent Labs     01/11/13  0730  01/12/13  0715   01/13/13  0540  NA  140  137  138  K  3.4*  4.8  5.0  CL  101  100  98  CO2  _0 BUN  19  19  25*  CREATININE  1.13  1.12  1.33  GLUCOSE  101*  153*  196*   Electrolytes Recent Labs     01/11/13  0730  01/12/13  0715  01/13/13  0540  CALCIUM  8.7  8.7  9.4  MG   --   1.8  2.2  PHOS   --   4.3  4.1   Sepsis Markers No results found for this basename: LACTICACIDVEN, PROCALCITON, O2SATVEN,  in the last 72 hours ABG Recent Labs     01/11/13  0810  PHART  7.398  PCO2ART  46.7*  PO2ART  153.0*   Liver Enzymes No results found for this basename: AST, ALT, ALKPHOS, BILITOT, ALBUMIN,  in the last 72 hours Cardiac Enzymes Recent Labs     01/11/13  0730  01/13/13  0540  TROPONINI  <0.30   --   PROBNP  3900.0*  5513.0*   Glucose No results found for this basename: GLUCAP,  in the  last 72 hours  Imaging Dg Chest Port 1 View  01/13/2013   CLINICAL DATA:  Respiratory failure  EXAM: PORTABLE CHEST - 1 VIEW  COMPARISON:  01/12/2013  FINDINGS: Marked cardiopericardial enlargement. Hilar and AP window lymphadenopathy, chronic. No acute change in mediastinal size or shape.  Coarse interstitial opacities in the peripheral lungs is near the patient's baseline based on previous radiography. No effusion or pneumothorax.  IMPRESSION: Lung aeration is near baseline, with residual opacities in areas of pulmonary fibrosis.   Electronically Signed   By: Jorje Guild   On: 01/13/2013 05:42   Dg Chest Port 1 View  01/12/2013   CLINICAL DATA:  Shortness of breath and pulmonary edema  EXAM: PORTABLE CHEST - 1 VIEW  COMPARISON:  01/11/2013  FINDINGS: Reticular and airspace opacities are slightly decreased. Less cephalized pulmonary blood flow. Small pleural effusions. Stable cardiomegaly. Enlarged main pulmonary artery, consistent with hypertension.  IMPRESSION: Improving pulmonary edema. Small bilateral pleural effusions.   Electronically Signed   By: Jorje Guild   On: 01/12/2013 05:52      ASSESSMENT / PLAN:  PULMONARY Acute on chronic respiratory failure  AECOPD ILD - r/t psoriatic arthritis Likely [ulm edema component, in setting pulm hytn P:   - Cont O2 and titrate as needed. - Low dose solumedrol (chronic prednisone 57m daily). - Continue levaquin, no evidence of active infection to start HCAP coverage, if more fever or significant elevation in WBC then will reconsider. - Autoset CPAP - as tolerated for comfort. - Diuresis as BP allows. - BD. - Pulm hygiene. - DNR/DNI.  CARDIOVASCULAR Acute on chronic R sided heart failure  Severe Pulm HTN - pressures in 70's CAD Cor Pulmonale  P:  - Diuresis per cards. - Defer to cardiology if an echo is necessary. - Cardiology following. - Cont tyvaso, letairis.  RENAL Hypokalemia - resolved Chronic renal insufficiency  P:   - F/u chem. - Hold further K replacement. - Monitor renal function with aggressive diuresis.  GASTROINTESTINAL Abd distension - likely r/t volume overload  P:   - Monitor. - Change to heart healthy diet.  HEMATOLOGIC Coagulopathy - on xarelto for hx DVT  P:  - F/u cbc. - Cont home xarelto.  INFECTIOUS AECOPD  P:   - Levaquin as above. - Sputum culture. - Hold humira until abx course is complete.  ENDOCRINE No active issue P:   - Monitor glucose on chem with steroids.  NEUROLOGIC No active issue P:   - Monitor.  Hold further K replacement, continue diureses, transfer to tele, DNR status, CPAP while asleep, titrate O2 and continue treatment for PNA and diureses.  I have personally obtained a history, examined the patient, evaluated laboratory and imaging results, formulated the assessment and plan and placed orders.  WRush Farmer M.D. LCincinnati Eye InstitutePulmonary/Critical Care Medicine. Pager: [phone] After hours pager: [phone]

## 2013-01-13 NOTE — Progress Notes (Signed)
Pt states he couldn't tolerate CPAP last night and will wear partial rebreather mask tonight.

## 2013-01-13 NOTE — Evaluation (Signed)
Physical Therapy Evaluation Patient Details Name: [name] MRN: [id number] DOB: August 04, 1950 Today's Date: 01/13/2013 Time: 0973-5329 PT Time Calculation (min): 20 min  PT Assessment / Plan / Recommendation History of Present Illness  62yo male with hx COPD (8L home O2), CHF, pulm HTN, psoriatic arthritis and associated ILD.  Admitted 9/24 with Acute on chronic resp failure in setting AECOPD and decompensated R heart failure.   Clinical Impression  Pt admitted with the above. Pt currently with functional limitations due to the deficits listed below (see PT Problem List). Pt very limited due to decondition and decrease in SaO2. Pt will benefit from skilled PT to increase their independence and safety with mobility to allow discharge to the venue listed below.      PT Assessment  Patient needs continued PT services    Follow Up Recommendations  Supervision - Intermittent;No PT follow up    Equipment Recommendations  None recommended by PT    Frequency Min 3X/week    Precautions / Restrictions Precautions Precautions: None   Pertinent Vitals/Pain No c/o pain; Sa02 decreased to 87% on partial rebreather 15L;       Mobility  Bed Mobility Bed Mobility: Not assessed (Pt sitting in recliner) Transfers Transfers: Sit to Stand;Stand to Sit Sit to Stand: 4: Min guard;From chair/3-in-1 Stand to Sit: To chair/3-in-1;4: Min guard Details for Transfer Assistance: Minguard for safety Ambulation/Gait Ambulation/Gait Assistance: 4: Min guard Ambulation Distance (Feet): 70 Feet Assistive device: None Ambulation/Gait Assistance Details: Minguard for safety Gait Pattern: Step-through pattern;Decreased stride length;Shuffle;Wide base of support Stairs: No    Exercises     PT Diagnosis: Difficulty walking;Generalized weakness;Acute pain  PT Problem List: Decreased strength;Decreased activity tolerance;Decreased mobility;Decreased balance;Decreased knowledge of use of  DME;Cardiopulmonary status limiting activity PT Treatment Interventions: DME instruction;Gait training;Stair training;Functional mobility training;Therapeutic activities;Therapeutic exercise;Balance training;Patient/family education     PT Goals(Current goals can be found in the care plan section) Acute Rehab PT Goals Patient Stated Goal: To return home PT Goal Formulation: With patient Time For Goal Achievement: 01/20/13 Potential to Achieve Goals: Good  Visit Information  Last PT Received On: 01/13/13 Assistance Needed: +1 History of Present Illness: 62yo male with hx COPD (8L home O2), CHF, pulm HTN, psoriatic arthritis and associated ILD.  Admitted 9/24 with Acute on chronic resp failure in setting AECOPD and decompensated R heart failure.        Prior [place] expects to be discharged to:: Private residence Living Arrangements: Spouse/significant other Available Help at Discharge: Family Type of Home: House Home Access: Stairs to enter Technical brewer of Steps: 1 Home Layout: One level Home Equipment: None Prior Function Level of Independence: Independent Communication Communication: No difficulties Dominant Hand: Right    Cognition  Cognition Arousal/Alertness: Awake/alert Behavior During Therapy: WFL for tasks assessed/performed Overall Cognitive Status: Within Functional Limits for tasks assessed    Extremity/Trunk Assessment Lower Extremity Assessment Lower Extremity Assessment: Generalized weakness   Balance    End of Session PT - End of Session Equipment Utilized During Treatment: Gait belt;Oxygen (parital rebreather 15L) Activity Tolerance: Patient tolerated treatment well Patient left: in chair;with call bell/phone within reach Nurse Communication: Mobility status  GP     Victorine Mcnee 01/13/2013, 3:09 PM  Antoine Poche, [place] DPT [phone]

## 2013-01-14 ENCOUNTER — Inpatient Hospital Stay (HOSPITAL_COMMUNITY): Payer: BC Managed Care – PPO

## 2013-01-14 DIAGNOSIS — J3489 Other specified disorders of nose and nasal sinuses: Secondary | ICD-10-CM

## 2013-01-14 DIAGNOSIS — Z7901 Long term (current) use of anticoagulants: Secondary | ICD-10-CM

## 2013-01-14 LAB — CBC
HCT: 48.8 % (ref 39.0–52.0)
Hemoglobin: 15.2 g/dL (ref 13.0–17.0)
MCH: 28.3 pg (ref 26.0–34.0)
MCV: 90.9 fL (ref 78.0–100.0)
RBC: 5.37 MIL/uL (ref 4.22–5.81)
RDW: 17 % — ABNORMAL HIGH (ref 11.5–15.5)
WBC: 14.4 10*3/uL — ABNORMAL HIGH (ref 4.0–10.5)

## 2013-01-14 LAB — BASIC METABOLIC PANEL
BUN: 29 mg/dL — ABNORMAL HIGH (ref 6–23)
CO2: 27 mEq/L (ref 19–32)
Calcium: 8.6 mg/dL (ref 8.4–10.5)
Chloride: 95 mEq/L — ABNORMAL LOW (ref 96–112)
Creatinine, Ser: 1.21 mg/dL (ref 0.50–1.35)
GFR calc Af Amer: 71 mL/min — ABNORMAL LOW (ref >90)
GFR calc non Af Amer: 62 mL/min — ABNORMAL LOW (ref >90)
Glucose, Bld: 184 mg/dL — ABNORMAL HIGH (ref 70–99)

## 2013-01-14 LAB — PHOSPHORUS: Phosphorus: 5.4 mg/dL — ABNORMAL HIGH (ref 2.3–4.6)

## 2013-01-14 NOTE — Progress Notes (Signed)
The patient is A&Ox4 and remained on the non-rebreather mask overnight.  He desated to the low 80s with activity and movement.  Otherwise, his O2 sats remained in the upper 80s to low 90s.  He was placed on a continuous pulse ox at the beginning of the shift.  He likes ice chips and seems to be compliant with his heart healthy diet.  He diuresed well overnight.

## 2013-01-14 NOTE — Progress Notes (Signed)
Pt declined CPAP tonight.  Pt currently wearing Partial rebreather mask running at 15L.

## 2013-01-14 NOTE — Consult Note (Signed)
Subjective:  SOB improving but he is still on O2.  Objective:  Vital Signs in the last 24 hours: Temp:  [97.2 F (36.2 C)-97.9 F (36.6 C)] 97.2 F (36.2 C) (09/27 0858) Pulse Rate:  [65-86] 83 (09/27 0858) Resp:  [20-22] 21 (09/27 0526) BP: (94-120)/(59-73) 112/67 mmHg (09/27 0858) SpO2:  [87 %-100 %] 97 % (09/27 0924) Weight:  [207 lb 3.7 oz (94 kg)-207 lb 14.3 oz (94.3 kg)] 207 lb 3.7 oz (94 kg) (09/27 0526)  Intake/Output from previous day:  Intake/Output Summary (Last 24 hours) at 01/14/13 1001 Last data filed at 01/14/13 0316  Gross per 24 hour  Intake    480 ml  Output   2951 ml  Net  -2471 ml    Physical Exam: General appearance: alert, cooperative, moderately obese and chronically ill Lungs: decreased breath sounds Heart: regular rate and rhythm Extremities: trace edema   Rate: 84  Rhythm: normal sinus rhythm  Lab Results:  Recent Labs  01/13/13 0540 01/14/13 0500  WBC 15.0* 14.4*  HGB 16.3 15.2  PLT 171 142*    Recent Labs  01/13/13 0540 01/14/13 0500  NA 138 136  K 5.0 4.1  CL 98 95*  CO2 25 27  GLUCOSE 196* 184*  BUN 25* 29*  CREATININE 1.33 1.21   No results found for this basename: TROPONINI, CK, MB,  in the last 72 hours No results found for this basename: INR,  in the last 72 hours  Imaging: Imaging results have been reviewed  Cardiac Studies:  Assessment/Plan:   Principal Problem:   Acute and chronic respiratory failure Active Problems:   Pulmonary hypertension   C O P D   Hypoxemia   Chronic right-sided HF (heart failure)   Anasarca   Cor pulmonale, chronic - with acute on Chronic exacerbation   PULMONARY FIBROSIS   PSORIASIS   ARTHRITIS, RHEUMATOID   CAD - moderate by cath March 2012   Hypokalemia   Chronic anticoagulation- Xarelto (hx of DVT)    PLAN:  Continue IV Lasix TID. He is diuresing and B/P and SCr  stable.    Kerin Ransom PA-C Beeper [phone] 01/14/2013, 10:01 AM   Agree with note written by Kerin Ransom PAC  Diuresing. Still SOB requiring supplemental O2. 3+ pitting edema bilaterally. On TID iv lasix. Scr OK. Continue to slowly diurese. May want to add zaroxolyn.Marland Kitchen BNP increased.    Lorretta Harp 01/14/2013 10:28 AM

## 2013-01-14 NOTE — Progress Notes (Signed)
PULMONARY  / CRITICAL CARE MEDICINE  Name: [name] MRN: [id number] DOB: 11/08/1950    ADMISSION DATE:  01/11/2013  REFERRING MD :  EDP PRIMARY SERVICE: PCCM  CHIEF COMPLAINT:  SOB  BRIEF PATIENT DESCRIPTION: 62yo male with hx COPD (8L home O2), CHF, pulm HTN, psoriatic arthritis and associated ILD.  Admitted 9/24 with Acute on chronic resp failure in setting AECOPD and decompensated R heart failure.   SIGNIFICANT EVENTS / STUDIES:    LINES / TUBES: none  CULTURES: Sputum 9/24>>>  ANTIBIOTICS: Levaquin 9/24>>>  Subjective/ Overnight: Didn't tolerate CPAP but did tolerate non-rebreathing mask. Clot in nostril - epistaxis from over-drying. Has saline gel.  Aware of dependent edema. Denies new needs.   VITAL SIGNS: Temp:  [97.2 F (36.2 C)-97.9 F (36.6 C)] 97.2 F (36.2 C) (09/27 0526) Pulse Rate:  [65-86] 69 (09/27 0526) Resp:  [20-22] 21 (09/27 0526) BP: (94-120)/(59-73) 94/59 mmHg (09/27 0526) SpO2:  [87 %-100 %] 87 % (09/27 0526) Weight:  [94 kg (207 lb 3.7 oz)-94.3 kg (207 lb 14.3 oz)] 94 kg (207 lb 3.7 oz) (09/27 0526) HEMODYNAMICS:   VENTILATOR SETTINGS:   INTAKE / OUTPUT: Intake/Output     09/26 0701 - 09/27 0700 09/27 0701 - 09/28 0700   P.O. 600    IV Piggyback     Total Intake(mL/kg) 600 (6.4)    Urine (mL/kg/hr) 3400 (1.5)    Stool 1 (0)    Total Output 3401     Net -2801           PHYSICAL EXAMINATION: General:  Chronically ill appearing male, No acute distress. Dusky face. Up in recliner-legs elevated Neuro:  Awake, alert, appropriate, MAE HEENT:  Mm dry, no JVD, face ruddy (normal per wife) Cardiovascular:  s1s2 rrr Lungs:  resps even mildly labored on venti mask, coarse crackles throughout Abdomen:  Distended, hypoactive bs Musculoskeletal:  Warm and dry, 1-2+ pitting edema BLE  LABS:  CBC Recent Labs     01/12/13  0715  01/13/13  0540  01/14/13  0500  WBC  10.3  15.0*  14.4*  HGB  15.5  16.3  15.2  HCT  49.5  53.1*  48.8   PLT  152  171  142*   Coag's No results found for this basename: APTT, INR,  in the last 72 hours BMET Recent Labs     01/12/13  0715  01/13/13  0540  01/14/13  0500  NA  137  138  136  K  4.8  5.0  4.1  CL  100  98  95*  CO2  _0 BUN  19  25*  29*  CREATININE  1.12  1.33  1.21  GLUCOSE  153*  196*  184*   Electrolytes Recent Labs     01/12/13  0715  01/13/13  0540  01/14/13  0500  CALCIUM  8.7  9.4  8.6  MG  1.8  2.2  2.3  PHOS  4.3  4.1  5.4*   Sepsis Markers No results found for this basename: LACTICACIDVEN, PROCALCITON, O2SATVEN,  in the last 72 hours ABG No results found for this basename: PHART, PCO2ART, PO2ART,  in the last 72 hours Liver Enzymes No results found for this basename: AST, ALT, ALKPHOS, BILITOT, ALBUMIN,  in the last 72 hours Cardiac Enzymes Recent Labs     01/13/13  0540  01/14/13  0500  PROBNP  5513.0*  5269.0*   Glucose No  results found for this basename: GLUCAP,  in the last 72 hours  Imaging Dg Chest Port 1 View  01/14/2013   CLINICAL DATA:  Hypoxemia. COPD. Acute on chronic respiratory failure.  EXAM: PORTABLE CHEST - 1 VIEW  COMPARISON:  01/13/2013 and 05/17/2012  FINDINGS: Cardiomegaly is stable. Chronic pulmonary interstitial fibrosis is stable in appearance since previous study. No acute or superimposed infiltrate identified. No evidence of pleural effusion.  IMPRESSION: Stable chronic pulmonary interstitial fibrosis and cardiomegaly. No acute findings.   Electronically Signed   By: Earle Gell   On: 01/14/2013 08:55   Dg Chest Port 1 View  01/13/2013   CLINICAL DATA:  Respiratory failure  EXAM: PORTABLE CHEST - 1 VIEW  COMPARISON:  01/12/2013  FINDINGS: Marked cardiopericardial enlargement. Hilar and AP window lymphadenopathy, chronic. No acute change in mediastinal size or shape.  Coarse interstitial opacities in the peripheral lungs is near the patient's baseline based on previous radiography. No effusion or pneumothorax.   IMPRESSION: Lung aeration is near baseline, with residual opacities in areas of pulmonary fibrosis.   Electronically Signed   By: Jorje Guild   On: 01/13/2013 05:42     ASSESSMENT / PLAN:  PULMONARY Acute on chronic respiratory failure - tolerates NRBM better than CPAP AECOPD ILD - r/t psoriatic arthritis Likely pulm edema component, in setting pulm hytn CXR 9/27- unchanged fibrosis and CE P:   - Cont O2 and titrate as needed. - Low dose solumedrol (chronic prednisone 68m daily). - Continue levaquin, no evidence of active infection to start HCAP coverage, if more fever or significant elevation in WBC then will reconsider. - Autoset CPAP - as tolerated for comfort. Not tolerated well - Diuresis as BP allows. - BD. - Pulm hygiene. - DNR/DNI.  CARDIOVASCULAR Acute on chronic R sided heart failure  Severe Pulm HTN - pressures in 70's CAD Cor Pulmonale  P:  - Diuresis per cards. - Defer to cardiology if an echo is necessary. - Cardiology following. - Cont tyvaso, letairis.  RENAL Hypokalemia - resolved Chronic renal insufficiency  P:   - F/u chem. - Hold further K replacement. - Monitor renal function with aggressive diuresis.  GASTROINTESTINAL Abd distension - likely r/t volume overload  P:   - Monitor. - Change to heart healthy diet.  HEMATOLOGIC Coagulopathy - on xarelto for hx DVT  P:  - F/u cbc. - Cont home xarelto.  INFECTIOUS AECOPD  P:   - Levaquin as above. - Sputum culture. - Hold humira until abx course is complete.  ENDOCRINE No active issue P:   - Monitor glucose on chem with steroids.  NEUROLOGIC No active issue P:   - Monitor.  Hold further K replacement, continue diureses, transfer to tele, DNR status, CPAP if tolerated while asleep, titrate O2 and continue treatment for PNA and diureses.   CHanover MD PCCM P [phone]  [phone] hours pager: [phone]

## 2013-01-14 NOTE — Progress Notes (Signed)
Pt. Currently no pain, AM Care provided, VSS, Non-Re breather mask. Several small glossy skin breakdowns on top left butt cheek. Heart sounds were normal. Diminished lung sounds in upper rt and left lobes. Diminished crackling sounds in base of rt and lt lobe. +2 pitting edema lower extremities.

## 2013-01-15 MED ORDER — TREPROSTINIL 0.6 MG/ML IN SOLN
18.0000 ug | Freq: Every day | RESPIRATORY_TRACT | Status: DC
  Administered 2013-01-16 – 2013-01-18 (×3): 18 ug via RESPIRATORY_TRACT

## 2013-01-15 MED ORDER — METOLAZONE 5 MG PO TABS
5.0000 mg | ORAL_TABLET | Freq: Every day | ORAL | Status: DC
  Administered 2013-01-15 – 2013-01-17 (×3): 5 mg via ORAL
  Filled 2013-01-15 (×4): qty 1

## 2013-01-15 NOTE — Progress Notes (Signed)
PULMONARY  / CRITICAL CARE MEDICINE  Name: [name] MRN: [id number] DOB: [birth year]    ADMISSION DATE:  01/11/2013  REFERRING MD :  EDP PRIMARY SERVICE: PCCM  CHIEF COMPLAINT:  SOB  BRIEF PATIENT DESCRIPTION: 62yo male with hx COPD (8L home O2), CHF, pulm HTN, psoriatic arthritis and associated ILD.  Admitted 9/24 with Acute on chronic resp failure in setting AECOPD and decompensated R heart failure.   SIGNIFICANT EVENTS / STUDIES:    LINES / TUBES: none  CULTURES: Sputum 9/24>>>  ANTIBIOTICS: Levaquin 9/24>>>  Subjective/ Overnight: Declines CPAP, not tolerated. Keeps NRBM 15L. Says he felt light headed w/o vertigo while brushing teeth this AM. OK now lying L side down on O2. Woke easily from sleep.  VITAL SIGNS: Temp:  [97.3 F (36.3 C)-98.5 F (36.9 C)] 98.5 F (36.9 C) (09/28 0441) Pulse Rate:  [74-97] 97 (09/28 0441) Resp:  [20] 20 (09/28 0441) BP: (104-113)/(58-66) 111/66 mmHg (09/28 0441) SpO2:  [88 %-97 %] 88 % (09/28 0844) Weight:  [91.5 kg (201 lb 11.5 oz)-93 kg (205 lb 0.4 oz)] 91.5 kg (201 lb 11.5 oz) (09/28 0441) HEMODYNAMICS:   VENTILATOR SETTINGS:   INTAKE / OUTPUT: Intake/Output     09/27 0701 - 09/28 0700 09/28 0701 - 09/29 0700   P.O. 840    Total Intake(mL/kg) 840 (9.2)    Urine (mL/kg/hr) 3400 (1.5)    Stool     Total Output 3400     Net -2560           PHYSICAL EXAMINATION: General:  Chronically ill appearing male, No acute distress. Plethoric.  Neuro: Woke easily from sleep to alert and responsive. HEENT:  Mm dry, no JVD, face ruddy (normal per wife) Cardiovascular:  s1s2 rrr Lungs:  resps even mildly labored on venti mask, coarse crackles throughout. Sat 88% on 15L Abdomen:  Distended, hypoactive bs Musculoskeletal:  Warm and dry, 1-2+ pitting edema BLE  LABS:  CBC Recent Labs     01/13/13  0540  01/14/13  0500  WBC  15.0*  14.4*  HGB  16.3  15.2  HCT  53.1*  48.8  PLT  171  142*   Coag's No results found for this  basename: APTT, INR,  in the last 72 hours BMET Recent Labs     01/13/13  0540  01/14/13  0500  NA  138  136  K  5.0  4.1  CL  98  95*  CO2  25  27  BUN  25*  29*  CREATININE  1.33  1.21  GLUCOSE  196*  184*   Electrolytes Recent Labs     01/13/13  0540  01/14/13  0500  CALCIUM  9.4  8.6  MG  2.2  2.3  PHOS  4.1  5.4*   Sepsis Markers No results found for this basename: LACTICACIDVEN, PROCALCITON, O2SATVEN,  in the last 72 hours ABG No results found for this basename: PHART, PCO2ART, PO2ART,  in the last 72 hours Liver Enzymes No results found for this basename: AST, ALT, ALKPHOS, BILITOT, ALBUMIN,  in the last 72 hours Cardiac Enzymes Recent Labs     01/13/13  0540  01/14/13  0500  PROBNP  5513.0*  5269.0*   Glucose No results found for this basename: GLUCAP,  in the last 72 hours  Imaging Dg Chest Port 1 View  01/14/2013   CLINICAL DATA:  Hypoxemia. COPD. Acute on chronic respiratory failure.  EXAM: PORTABLE CHEST - 1 VIEW  COMPARISON:  01/13/2013 and 05/17/2012  FINDINGS: Cardiomegaly is stable. Chronic pulmonary interstitial fibrosis is stable in appearance since previous study. No acute or superimposed infiltrate identified. No evidence of pleural effusion.  IMPRESSION: Stable chronic pulmonary interstitial fibrosis and cardiomegaly. No acute findings.   Electronically Signed   By: Earle Gell   On: 01/14/2013 08:55     ASSESSMENT / PLAN:  PULMONARY Acute on chronic respiratory failure - tolerates NRBM better than CPAP AECOPD ILD - r/t psoriatic arthritis Likely pulm edema component, in setting pulm hytn CXR 9/27- unchanged fibrosis and CE P:   - Cont O2 and titrate as needed. - Low dose solumedrol (chronic prednisone 19m daily). - Continue levaquin, no evidence of active infection to start HCAP coverage, if more fever or significant elevation in WBC then will reconsider. - Autoset CPAP - as tolerated for comfort. Not tolerated well - Diuresis as BP  allows. - BD. - Pulm hygiene. - DNR/DNI.  CARDIOVASCULAR Acute on chronic R sided heart failure  Severe Pulm HTN - pressures in 70's CAD Cor Pulmonale  P:  - Diuresis per cards. - Defer to cardiology if an echo is necessary. - Cardiology following. - Cont tyvaso, letairis.  RENAL Hypokalemia - resolved Chronic renal insufficiency  P:   - F/u chem. - Hold further K replacement. - Monitor renal function with aggressive diuresis.  GASTROINTESTINAL Abd distension - likely r/t volume overload  P:   - Monitor. - Change to heart healthy diet.  HEMATOLOGIC Coagulopathy - on xarelto for hx DVT  P:  - F/u cbc. - Cont home xarelto.  INFECTIOUS AECOPD  P:   - Levaquin as above. - Sputum culture. - Hold humira until abx course is complete.  ENDOCRINE No active issue P:   - Monitor glucose on chem with steroids.  NEUROLOGIC "Light headed while brushing teeth today may be hypoxia or orthostasis P:   - Monitor.  Hold further K replacement, continue diureses, transfer to tele, DNR status, CPAP if tolerated while asleep, titrate O2 and continue treatment for PNA and diureses.   CMorrice MD PCCM P [phone]  [phone] hours pager: [phone]

## 2013-01-15 NOTE — Progress Notes (Signed)
Subjective:  Chronic SOB but says he is breathing better since admission  Objective:  Temp:  [97.3 F (36.3 C)-98.5 F (36.9 C)] 98.5 F (36.9 C) (09/28 0441) Pulse Rate:  [74-97] 97 (09/28 0441) Resp:  [20] 20 (09/28 0441) BP: (108-113)/(60-66) 111/66 mmHg (09/28 0441) SpO2:  [88 %-96 %] 88 % (09/28 0844) Weight:  [201 lb 11.5 oz (91.5 kg)] 201 lb 11.5 oz (91.5 kg) (09/28 0441) Weight change: -2 lb 13.9 oz (-1.3 kg)  Intake/Output from previous day: 09/27 0701 - 09/28 0700 In: 840 [P.O.:840] Out: 3400 [Urine:3400]  Intake/Output from this shift: Total I/O In: 240 [P.O.:240] Out: 400 [Urine:400]  Physical Exam: General appearance: alert and no distress Neck: no adenopathy, no carotid bruit, no JVD, supple, symmetrical, trachea midline and thyroid not enlarged, symmetric, no tenderness/mass/nodules Lungs: Diminished BS bilaterally Heart: regular rate and rhythm, S1, S2 normal, no murmur, click, rub or gallop Extremities: 3+ pitting edema  Lab Results: Results for orders placed during the hospital encounter of 01/11/13 (from the past 48 hour(s))  PRO B NATRIURETIC PEPTIDE     Status: Abnormal   Collection Time    01/14/13  5:00 AM      Result Value Range   Pro B Natriuretic peptide (BNP) 5269.0 (*) 0 - 125 pg/mL  CBC     Status: Abnormal   Collection Time    01/14/13  5:00 AM      Result Value Range   WBC 14.4 (*) 4.0 - 10.5 K/uL   RBC 5.37  4.22 - 5.81 MIL/uL   Hemoglobin 15.2  13.0 - 17.0 g/dL   HCT 48.8  39.0 - 52.0 %   MCV 90.9  78.0 - 100.0 fL   MCH 28.3  26.0 - 34.0 pg   MCHC 31.1  30.0 - 36.0 g/dL   RDW 17.0 (*) 11.5 - 15.5 %   Platelets 142 (*) 150 - 400 K/uL  BASIC METABOLIC PANEL     Status: Abnormal   Collection Time    01/14/13  5:00 AM      Result Value Range   Sodium 136  135 - 145 mEq/L   Potassium 4.1  3.5 - 5.1 mEq/L   Comment: DELTA CHECK NOTED   Chloride 95 (*) 96 - 112 mEq/L   CO2 27  19 - 32 mEq/L   Glucose, Bld 184 (*) 70 - 99  mg/dL   BUN 29 (*) 6 - 23 mg/dL   Creatinine, Ser 1.21  0.50 - 1.35 mg/dL   Calcium 8.6  8.4 - 10.5 mg/dL   GFR calc non Af Amer 62 (*) >90 mL/min   GFR calc Af Amer 71 (*) >90 mL/min   Comment: (NOTE)     The eGFR has been calculated using the CKD EPI equation.     This calculation has not been validated in all clinical situations.     eGFR's persistently <90 mL/min signify possible Chronic Kidney     Disease.  MAGNESIUM     Status: None   Collection Time    01/14/13  5:00 AM      Result Value Range   Magnesium 2.3  1.5 - 2.5 mg/dL  PHOSPHORUS     Status: Abnormal   Collection Time    01/14/13  5:00 AM      Result Value Range   Phosphorus 5.4 (*) 2.3 - 4.6 mg/dL    Imaging: Imaging results have been reviewed  Assessment/Plan:   1. Principal Problem: 2.  Acute and chronic respiratory failure 3. Active Problems: 4.   C O P D 5.   PULMONARY FIBROSIS 6.   PSORIASIS 7.   ARTHRITIS, RHEUMATOID 8.   Hypoxemia 9.   Chronic right-sided HF (heart failure) 10.   Pulmonary hypertension 11.   Anasarca 12.   CAD - moderate by cath March 2012 13.   Hypokalemia 14.   Cor pulmonale, chronic - with acute on Chronic exacerbation 15.   Chronic anticoagulation- Xarelto (hx of DVT) 16.   Time Spent Directly with Patient:  20 minutes  Length of Stay:  LOS: 4 days   Still SOB on lasix TID. On steroids as well. Face appears cyanotic. I/Os have slowed down. Will add Zaroxolyn. Not much more to offer.  Lorretta Harp 01/15/2013, 11:24 AM

## 2013-01-16 LAB — BASIC METABOLIC PANEL
BUN: 34 mg/dL — ABNORMAL HIGH (ref 6–23)
CO2: 41 mEq/L (ref 19–32)
Calcium: 9.1 mg/dL (ref 8.4–10.5)
Chloride: 83 mEq/L — ABNORMAL LOW (ref 96–112)
Creatinine, Ser: 1.29 mg/dL (ref 0.50–1.35)
GFR calc non Af Amer: 57 mL/min — ABNORMAL LOW (ref >90)
Glucose, Bld: 194 mg/dL — ABNORMAL HIGH (ref 70–99)
Sodium: 135 mEq/L (ref 135–145)

## 2013-01-16 MED ORDER — POTASSIUM CHLORIDE CRYS ER 20 MEQ PO TBCR
40.0000 meq | EXTENDED_RELEASE_TABLET | Freq: Once | ORAL | Status: AC
  Administered 2013-01-16: 40 meq via ORAL
  Filled 2013-01-16: qty 2

## 2013-01-16 NOTE — Progress Notes (Signed)
Patient CO2 level 41. Dr. Halford Chessman notified.  No new orders.

## 2013-01-16 NOTE — Progress Notes (Signed)
Patient has been refusing the CPAP machine. Patient is currently on 50% venti-mask with 02 sats around 92%. RT will continue to assist as needed.

## 2013-01-16 NOTE — Progress Notes (Signed)
Patient with no complaints. Resting in bed at this time.

## 2013-01-16 NOTE — Progress Notes (Signed)
Physical Therapy Treatment Patient Details Name: [name] MRN: [id number] DOB: [birth year] Today's Date: 01/16/2013 Time: 1157-1209 PT Time Calculation (min): 12 min  PT Assessment / Plan / Recommendation  History of Present Illness 62yo male with hx COPD (8L home O2), CHF, pulm HTN, psoriatic arthritis and associated ILD.  Admitted 9/24 with Acute on chronic resp failure in setting AECOPD and decompensated R heart failure.    PT Comments   Pt mobility limited by cardiopulmonary function. Pt con't to require 15L O2 via venti mask for mobility. Pt functioning at mod I level and anticipate will con't to be safe for d/c home once medically stable. Freq dec to 2x/wk due to pt functioning at mod I level. Acute PT to follow for energy conservation.   Follow Up Recommendations  Supervision - Intermittent;No PT follow up     Does the patient have the potential to tolerate intense rehabilitation     Barriers to Discharge        Equipment Recommendations  None recommended by PT    Recommendations for Other Services    Frequency Min 2X/week   Progress towards PT Goals Progress towards PT goals: Progressing toward goals  Plan Frequency needs to be updated;Current plan remains appropriate    Precautions / Restrictions Precautions Precaution Comments: on 15 Lo2 via venti mask Restrictions Weight Bearing Restrictions: No   Pertinent Vitals/Pain Denies pain    Mobility  Bed Mobility Bed Mobility: Supine to Sit Supine to Sit: 6: Modified independent (Device/Increase time);HOB elevated Details for Bed Mobility Assistance: safe technique Transfers Transfers: Sit to Stand;Stand to Sit Sit to Stand: 6: Modified independent (Device/Increase time);With upper extremity assist Stand to Sit: 6: Modified independent (Device/Increase time) Details for Transfer Assistance: safe technique Ambulation/Gait Ambulation/Gait Assistance: 4: Min guard Ambulation Distance (Feet): 70 Feet Assistive  device: None Ambulation/Gait Assistance Details: antalgic limp, + SOB, SpO2 into 70s on 15 Lo2 via venti mask. no episodes of LOB Gait Pattern: Step-through pattern;Wide base of support Gait velocity: slow Stairs: No (pt reports he moved so he doesn't have to do stairs)    Exercises     PT Diagnosis:    PT Problem List:   PT Treatment Interventions:     PT Goals (current goals can now be found in the care plan section) Acute Rehab PT Goals Patient Stated Goal: go home  Visit Information  Last PT Received On: 01/16/13 Assistance Needed: +1 History of Present Illness: 62yo male with hx COPD (8L home O2), CHF, pulm HTN, psoriatic arthritis and associated ILD.  Admitted 9/24 with Acute on chronic resp failure in setting AECOPD and decompensated R heart failure.     Subjective Data  Subjective: Pt received supine in bed with report "It's freezing in here." Patient Stated Goal: go home   Cognition  Cognition Arousal/Alertness: Awake/alert Behavior During Therapy: WFL for tasks assessed/performed Overall Cognitive Status: Within Functional Limits for tasks assessed    Balance     End of Session PT - End of Session Equipment Utilized During Treatment: Oxygen Activity Tolerance:  (limited by cardiopulmonary status) Patient left: in chair;with call bell/phone within reach Nurse Communication: Mobility status   GP     Kingsley Callander 01/16/2013, 12:30 PM  Kittie Plater, PT, DPT Pager #: [phone] Office #: [phone]

## 2013-01-16 NOTE — Progress Notes (Signed)
PULMONARY  / CRITICAL CARE MEDICINE  Name: [name] MRN: [id number] DOB: 10/06/1950    ADMISSION DATE:  01/11/2013  REFERRING MD :  EDP PRIMARY SERVICE: PCCM  CHIEF COMPLAINT:  SOB  BRIEF PATIENT DESCRIPTION: 62yo male with hx COPD (8L home O2), CHF, pulm HTN, psoriatic arthritis and associated ILD.  Admitted 9/24 with Acute on chronic resp failure in setting AECOPD and decompensated R heart failure.   SIGNIFICANT EVENTS / STUDIES:    LINES / TUBES: none  CULTURES: Sputum 9/24>>>  ANTIBIOTICS: Levaquin 9/24>>>  Subjective/ Overnight: Remains on and off NRB.  Desats to 70's with activity.  Overall feels much better.  Occasionally lightheaded with activity.   VITAL SIGNS: Temp:  [97.4 F (36.3 C)-97.6 F (36.4 C)] 97.6 F (36.4 C) (09/29 0434) Pulse Rate:  [77-86] 86 (09/29 0434) Resp:  [20] 20 (09/29 0434) BP: (97-123)/(49-69) 115/69 mmHg (09/29 0434) SpO2:  [88 %-100 %] 91 % (09/29 0841) FiO2 (%):  [50 %] 50 % (09/29 0841) Weight:  [194 lb 14.2 oz (88.4 kg)] 194 lb 14.2 oz (88.4 kg) (09/29 0434) HEMODYNAMICS:   VENTILATOR SETTINGS: Vent Mode:  [-]  FiO2 (%):  [50 %] 50 % INTAKE / OUTPUT: Intake/Output     09/28 0701 - 09/29 0700 09/29 0701 - 09/30 0700   P.O. 840    Total Intake(mL/kg) 840 (9.5)    Urine (mL/kg/hr) 4350 (2.1)    Total Output 4350     Net -3510           PHYSICAL EXAMINATION: General:  Chronically ill appearing male, No acute distress. Plethoric.  Neuro: awake, alert, appropriate, OOB in chair HEENT:  Mm dry, no JVD, face ruddy (normal per wife) Cardiovascular:  s1s2 rrr Lungs:  resps even non labored on NRB, coarse crackles throughout.  Abdomen:  Distended, hypoactive bs Musculoskeletal:  Warm and dry,  1+ pitting edema BLE much improved   LABS:  CBC Recent Labs     01/14/13  0500  WBC  14.4*  HGB  15.2  HCT  48.8  PLT  142*   Coag's No results found for this basename: APTT, INR,  in the last 72 hours BMET Recent Labs      01/14/13  0500  01/16/13  0352  NA  136  135  K  4.1  3.2*  CL  95*  83*  CO2  27  41*  BUN  29*  34*  CREATININE  1.21  1.29  GLUCOSE  184*  194*   Electrolytes Recent Labs     01/14/13  0500  01/16/13  0352  CALCIUM  8.6  9.1  MG  2.3   --   PHOS  5.4*   --    Sepsis Markers No results found for this basename: LACTICACIDVEN, PROCALCITON, O2SATVEN,  in the last 72 hours ABG No results found for this basename: PHART, PCO2ART, PO2ART,  in the last 72 hours Liver Enzymes No results found for this basename: AST, ALT, ALKPHOS, BILITOT, ALBUMIN,  in the last 72 hours Cardiac Enzymes Recent Labs     01/14/13  0500  PROBNP  5269.0*   Glucose No results found for this basename: GLUCAP,  in the last 72 hours  Imaging No results found.   ASSESSMENT / PLAN:  PULMONARY Acute on chronic respiratory failure - tolerates NRBM better than CPAP AECOPD ILD - r/t psoriatic arthritis Likely pulm edema component, in setting pulm hytn CXR 9/27- unchanged fibrosis and CE P:   -  Cont O2 and titrate as needed - may need to consider high flow O2 as outpt - was on 8L prior. - Cont Low dose solumedlol (chronic prednisone 76m daily). - Continue levaquin, no evidence of active infection to start HCAP coverage - consider d/c abx 9/30 for 7 days total abx  - d/c CPAP - Diuresis as BP allows. - BD. - Pulm hygiene. - DNR/DNI. -Co2 cont rise on chem - likely some degree contraction alkalosis r/t diuresis   CARDIOVASCULAR Acute on chronic R sided heart failure  Severe Pulm HTN - pressures in 70's CAD Cor Pulmonale  P:  - Diuresis per cards. - Defer to cardiology if an echo is necessary. - Cardiology following, zaroxolyn added 9/28  - Cont tyvaso, letairis.  RENAL Hypokalemia - resolved Chronic renal insufficiency  P:   - F/u chem. - replete K prn  - Monitor renal function with aggressive diuresis.  GASTROINTESTINAL Abd distension - likely r/t volume overload  P:   -  Monitor. - Change to heart healthy diet.  HEMATOLOGIC Coagulopathy - on xarelto for hx DVT  P:  - F/u cbc. - Cont home xarelto.  INFECTIOUS AECOPD  P:   - Levaquin as above - consider d/c 9/30. - Sputum culture. - Hold humira until abx course is complete.  ENDOCRINE No active issue P:   - Monitor glucose on chem with steroids.  NEUROLOGIC "Light headed" while brushing teeth today may be hypoxia or orthostasis P:   - Monitor.  Likely nearing d/c although O2 requirement cont to be issue.  May need to consider changing O2 device at home for higher flow O2. Otherwise seems much improved and near baseline.   WDarlina Sicilian NP 01/16/2013  10:18 AM Pager: [phone] or [phone] *Care during the described time interval was provided by me and/or other providers on the critical care team. I have reviewed this patient's available data, including medical history, events of note, physical examination and test results as part of my evaluation.  RBaltazar Apo MD, PhD 01/16/2013, 5:01 PM [facility] [phone] if no answer [phone]

## 2013-01-17 DIAGNOSIS — M069 Rheumatoid arthritis, unspecified: Secondary | ICD-10-CM

## 2013-01-17 LAB — BASIC METABOLIC PANEL
BUN: 40 mg/dL — ABNORMAL HIGH (ref 6–23)
Calcium: 9.6 mg/dL (ref 8.4–10.5)
GFR calc Af Amer: 54 mL/min — ABNORMAL LOW (ref >90)
GFR calc non Af Amer: 46 mL/min — ABNORMAL LOW (ref >90)
Glucose, Bld: 170 mg/dL — ABNORMAL HIGH (ref 70–99)
Potassium: 3 mEq/L — ABNORMAL LOW (ref 3.5–5.1)
Sodium: 134 mEq/L — ABNORMAL LOW (ref 135–145)

## 2013-01-17 LAB — PRO B NATRIURETIC PEPTIDE: Pro B Natriuretic peptide (BNP): 5003 pg/mL — ABNORMAL HIGH (ref 0–125)

## 2013-01-17 MED ORDER — POTASSIUM CHLORIDE CRYS ER 20 MEQ PO TBCR
40.0000 meq | EXTENDED_RELEASE_TABLET | Freq: Once | ORAL | Status: AC
  Administered 2013-01-17: 40 meq via ORAL

## 2013-01-17 MED ORDER — POTASSIUM CHLORIDE CRYS ER 20 MEQ PO TBCR
40.0000 meq | EXTENDED_RELEASE_TABLET | Freq: Once | ORAL | Status: AC
  Administered 2013-01-17: 40 meq via ORAL
  Filled 2013-01-17 (×2): qty 2

## 2013-01-17 MED ORDER — PREDNISONE 20 MG PO TABS
40.0000 mg | ORAL_TABLET | Freq: Two times a day (BID) | ORAL | Status: DC
  Administered 2013-01-17 – 2013-01-18 (×3): 40 mg via ORAL
  Filled 2013-01-17 (×5): qty 2

## 2013-01-17 MED ORDER — FUROSEMIDE 80 MG PO TABS
80.0000 mg | ORAL_TABLET | Freq: Two times a day (BID) | ORAL | Status: DC
  Administered 2013-01-18 – 2013-01-19 (×3): 80 mg via ORAL
  Filled 2013-01-17 (×7): qty 1

## 2013-01-17 MED ORDER — FLUTICASONE PROPIONATE 50 MCG/ACT NA SUSP
2.0000 | Freq: Two times a day (BID) | NASAL | Status: DC
  Administered 2013-01-17 – 2013-01-19 (×4): 2 via NASAL
  Filled 2013-01-17: qty 16

## 2013-01-17 NOTE — Progress Notes (Signed)
Pt. Seen and examined. Agree with the NP/PA-C note as written.  Zaroxolyn has made a significant difference in diuresis, he is out almost 5L over the past 2 days, which probably accounts for the rise in creatinine. I agree with switching to po lasix - we may need to decrease the dose or move zaroxlyn to QOD dosing.  Pixie Casino, MD, [facility] Attending Cardiologist [facility]

## 2013-01-17 NOTE — Progress Notes (Addendum)
The patient's O2 sat is anywhere between the mid-80s and mid-90s on the Venturi mask at 50%.  His O2 sats seem to be lower when he first wakes up in the morning, but is generally stable around 90% while he is sleeping.  His color tonight is darker and more purple than when I took care of him on Friday night.  He is complaining about a stuffy nose without much relief from his nasal drops.  Otherwise, he did not have any complaints this morning.

## 2013-01-17 NOTE — Progress Notes (Signed)
Subjective: Breathing better.  Objective: Vital signs in last 24 hours: Temp:  [97.3 F (36.3 C)-97.6 F (36.4 C)] 97.4 F (36.3 C) (09/30 0541) Pulse Rate:  [71-83] 71 (09/30 0541) Resp:  [18-20] 18 (09/30 0541) BP: (96-112)/(60-68) 96/61 mmHg (09/30 0541) SpO2:  [83 %-95 %] 89 % (09/30 0858) FiO2 (%):  [50 %] 50 % (09/30 0858) Weight:  [190 lb 3.2 oz (86.274 kg)] 190 lb 3.2 oz (86.274 kg) (09/30 0541) Last BM Date: 01/16/13  Intake/Output from previous day: 09/29 0701 - 09/30 0700 In: 1260 [P.O.:1260] Out: 3025 [Urine:3025] Intake/Output this shift: Total I/O In: 360 [P.O.:360] Out: 500 [Urine:500]  Medications Current Facility-Administered Medications  Medication Dose Route Frequency Provider Last Rate Last Dose  . 0.9 %  sodium chloride infusion  250 mL Intravenous PRN Marijean Heath, NP      . 0.9 %  sodium chloride infusion   Intravenous Continuous Marijean Heath, NP      . albuterol (PROVENTIL) (5 MG/ML) 0.5% nebulizer solution 2.5 mg  2.5 mg Nebulization Q6H Marijean Heath, NP   2.5 mg at 01/17/13 0858   And  . ipratropium (ATROVENT) nebulizer solution 0.5 mg  0.5 mg Nebulization Q6H Marijean Heath, NP   0.5 mg at 01/17/13 0858  . ambrisentan (LETAIRIS) tablet 10 mg  10 mg Oral Q breakfast Raylene Miyamoto, MD   10 mg at 01/16/13 1136  . B-complex with vitamin C tablet 1 tablet  1 tablet Oral Daily Raylene Miyamoto, MD   1 tablet at 01/16/13 1021  . calcium-vitamin D (OSCAL WITH D) 500-200 MG-UNIT per tablet 0.5 tablet  0.5 tablet Oral BID WC Raylene Miyamoto, MD   0.5 tablet at 01/16/13 1610  . folic acid (FOLVITE) tablet 1 mg  1 mg Oral Daily Marijean Heath, NP   1 mg at 01/16/13 1020  . furosemide (LASIX) injection 40 mg  40 mg Intravenous TID Troy Sine, MD   40 mg at 01/16/13 2221  . levofloxacin (LEVAQUIN) tablet 750 mg  750 mg Oral Daily Raylene Miyamoto, MD   750 mg at 01/16/13 1021  . loratadine (CLARITIN) tablet  10 mg  10 mg Oral Daily Marijean Heath, NP   10 mg at 01/16/13 1021  . methylPREDNISolone sodium succinate (SOLU-MEDROL) 40 mg/mL injection 40 mg  40 mg Intravenous Q12H Marijean Heath, NP   40 mg at 01/16/13 2222  . metolazone (ZAROXOLYN) tablet 5 mg  5 mg Oral Daily Lorretta Harp, MD   5 mg at 01/16/13 1021  . multivitamin with minerals tablet 1 tablet  1 tablet Oral Daily Raylene Miyamoto, MD   1 tablet at 01/16/13 1021  . pantoprazole (PROTONIX) EC tablet 40 mg  40 mg Oral Daily Marijean Heath, NP   40 mg at 01/16/13 1137  . Rivaroxaban (XARELTO) tablet 20 mg  20 mg Oral Q supper Raylene Miyamoto, MD   20 mg at 01/16/13 1609  . simvastatin (ZOCOR) tablet 20 mg  20 mg Oral q1800 Marijean Heath, NP   20 mg at 01/16/13 1723  . Treprostinil (TYVASO) inhalation solution 18 mcg  18 mcg Inhalation Daily Raylene Miyamoto, MD   18 mcg at 01/16/13 1000  . zolpidem (AMBIEN) tablet 5 mg  5 mg Oral QHS PRN Rush Farmer, MD        PE: General appearance: alert, cooperative and no distress Lungs: clear to auscultation  bilaterally Heart: regular rate and rhythm, S1, S2 normal, no murmur, click, rub or gallop Abdomen: +BS.  soft.  Nontender Extremities: No LEE Pulses: 2+ and symmetric Neurologic: Grossly normal  Lab Results:  No results found for this basename: WBC, HGB, HCT, PLT,  in the last 72 hours BMET  Recent Labs  01/16/13 0352 01/17/13 0535  NA 135 134*  K 3.2* 3.0*  CL 83* 78*  CO2 41* 42*  GLUCOSE 194* 170*  BUN 34* 40*  CREATININE 1.29 1.53*  CALCIUM 9.1 9.6   Assessment/Plan  Principal Problem:   Acute and chronic respiratory failure Active Problems:   PULMONARY FIBROSIS   Chronic right-sided HF (heart failure)   Pulmonary hypertension   C O P D   PSORIASIS   ARTHRITIS, RHEUMATOID   Hypoxemia   Anasarca   CAD - moderate by cath March 2012   Hypokalemia   Cor pulmonale, chronic - with acute on Chronic exacerbation   Chronic  anticoagulation- Xarelto (hx of DVT)  Plan: Net fluids:  -1.7L/-12.4L.  Zaroxolyn added  Yesterday which seems to have helped.  SCr has jumped up.  Will recheck BNP.  Last was 9/27.   His weight yesterday was 190#.  I think this is very close to his dry weight.  Will change Lasix to PO 83m bid and continue zaroxolyn.   Replace K+ now and at 1400hrs.       LOS: 6 days    Lenoir Facchini 01/17/2013 9:42 AM

## 2013-01-17 NOTE — Progress Notes (Signed)
PULMONARY  / CRITICAL CARE MEDICINE  Name: [name] MRN: [id number] DOB: 17-May-1950    ADMISSION DATE:  01/11/2013  REFERRING MD :  EDP PRIMARY SERVICE: PCCM  CHIEF COMPLAINT:  SOB  BRIEF PATIENT DESCRIPTION: 62yo male with hx COPD (8L home O2), CHF, pulm HTN, psoriatic arthritis and associated ILD.  Admitted 9/24 with Acute on chronic resp failure in setting AECOPD and decompensated R heart failure.   SIGNIFICANT EVENTS / STUDIES:    LINES / TUBES: none  CULTURES: Sputum 9/24>>>none  ANTIBIOTICS: Levaquin 9/24>>>  Subjective/ Overnight: Remains on and off NRB.  Desats to 70's with activity.  Overall feels much better.  Occasionally lightheaded with activity.   VITAL SIGNS: Temp:  [97.4 F (36.3 C)-97.6 F (36.4 C)] 97.6 F (36.4 C) (09/29 0434) Pulse Rate:  [77-86] 86 (09/29 0434) Resp:  [20] 20 (09/29 0434) BP: (97-123)/(49-69) 115/69 mmHg (09/29 0434) SpO2:  [88 %-100 %] 91 % (09/29 0841) FiO2 (%):  [50 %] 50 % (09/29 0841) Weight:  [194 lb 14.2 oz (88.4 kg)] 194 lb 14.2 oz (88.4 kg) (09/29 0434) HEMODYNAMICS:   VENTILATOR SETTINGS: Vent Mode:  [-]  FiO2 (%):  [50 %] 50 % INTAKE / OUTPUT: Intake/Output     09/29 0701 - 09/30 0700 09/30 0701 - 10/01 0700   P.O. 1260 360   Total Intake(mL/kg) 1260 (14.6) 360 (4.2)   Urine (mL/kg/hr) 3025 (1.5) 500 (2)   Total Output 3025 500   Net -1765 -140           PHYSICAL EXAMINATION: General:  Chronically ill appearing male, No acute distress. Plethoric.  Neuro: awake, alert, appropriate, OOB in chair HEENT:  Mm dry, no JVD, face ruddy (normal per wife) Cardiovascular:  s1s2 rrr Lungs:  resps even non labored on NRB, coarse crackles throughout.  Abdomen:  Distended, hypoactive bs Musculoskeletal:  Warm and dry,  1+ pitting edema BLE much improved   LABS:  CBC Recent Labs     01/14/13  0500  WBC  14.4*  HGB  15.2  HCT  48.8  PLT  142*   Coag's No results found for this basename: APTT, INR,  in  the last 72 hours BMET Recent Labs     01/14/13  0500  01/16/13  0352  NA  136  135  K  4.1  3.2*  CL  95*  83*  CO2  27  41*  BUN  29*  34*  CREATININE  1.21  1.29  GLUCOSE  184*  194*   Electrolytes Recent Labs     01/14/13  0500  01/16/13  0352  CALCIUM  8.6  9.1  MG  2.3   --   PHOS  5.4*   --    Sepsis Markers No results found for this basename: LACTICACIDVEN, PROCALCITON, O2SATVEN,  in the last 72 hours ABG No results found for this basename: PHART, PCO2ART, PO2ART,  in the last 72 hours Liver Enzymes No results found for this basename: AST, ALT, ALKPHOS, BILITOT, ALBUMIN,  in the last 72 hours Cardiac Enzymes Recent Labs     01/14/13  0500  PROBNP  5269.0*   Glucose No results found for this basename: GLUCAP,  in the last 72 hours  Imaging No results found.   ASSESSMENT / PLAN:  PULMONARY Acute on chronic respiratory failure - tolerates NRBM better than CPAP AECOPD ILD - r/t psoriatic arthritis Likely pulm edema component, in setting pulm hytn CXR 9/27- unchanged fibrosis and  CE P:   - Cont O2 and titrate as needed - may need to consider high flow O2 as outpt - was on 8L prior. - Cont Low dose solumedlol (chronic prednisone 23m daily). Consider change from solumedrol to pred 40 mg bid with taper 9-30 - Continue levaquin, no evidence of active infection to start HCAP coverage - consider d/c abx 9/30 for 7 days total abx  - d/c CPAP - Diuresis as BP allows. - BD. - Pulm hygiene. - DNR/DNI. -Co2 cont rise on chem - likely some degree contraction alkalosis r/t diuresis  - start flonase - nasal congestion and obstruction is a barrier to getting transitioned to oximizer O2  CARDIOVASCULAR Acute on chronic R sided heart failure  Severe Pulm HTN - pressures in 70's CAD Cor Pulmonale  P:  - Diuresis per cards. - Defer to cardiology if an echo is necessary. - Cardiology following, zaroxolyn added 9/28  - Cont tyvaso, letairis.  RENAL Hypokalemia   Chronic renal insufficiency  Lab Results  Component Value Date   CREATININE 1.53* 01/17/2013   CREATININE 1.29 01/16/2013   CREATININE 1.21 01/14/2013    Recent Labs Lab 01/14/13 0500 01/16/13 0352 01/17/13 0535  K 4.1 3.2* 3.0*     P:   - F/u chem. - replete K 9-30 - Monitor renal function with aggressive diuresis.  GASTROINTESTINAL Abd distension - likely r/t volume overload  P:   - Monitor. - Change to heart healthy diet.  HEMATOLOGIC Coagulopathy - on xarelto for hx DVT  P:  - F/u cbc. - Cont home xarelto.  INFECTIOUS AECOPD  P:   - Levaquin as above - consider d/c 9/30. - Sputum culture. - Hold humira until abx course is complete.  ENDOCRINE No active issue P:   - Monitor glucose on chem with steroids.  NEUROLOGIC "Light headed" while brushing teethmay be hypoxia or orthostasis P:   - Monitor.  Discussion: Near baseline, which is poor at best. He's on 50% fio2 with 88% sats and his usual dark gray coloring. I've asked him to have his wife bring oximizer from home . We will place him on home O2 regimen to ascertain if he is ready for dc.   SRichardson LandryMinor ACNP LMaryanna ShapePCCM Pager [phone] 3 pm If no answer page [phone] 02279/30/2014, 9:47 AM  RBaltazar Apo MD, PhD 01/17/2013, 5:21 PM [facility] [phone] if no answer [phone]

## 2013-01-17 NOTE — Progress Notes (Signed)
Patient has continued to refuse the CPAP machine. There is no CPAP in the patient room. Patient is currently on 50% venti-mask with o2 sats 88%. RT will continue to assist as needed.

## 2013-01-18 DIAGNOSIS — I5043 Acute on chronic combined systolic (congestive) and diastolic (congestive) heart failure: Secondary | ICD-10-CM | POA: Diagnosis present

## 2013-01-18 MED ORDER — PREDNISONE 20 MG PO TABS
40.0000 mg | ORAL_TABLET | Freq: Every day | ORAL | Status: DC
  Administered 2013-01-19: 40 mg via ORAL
  Filled 2013-01-18 (×2): qty 2

## 2013-01-18 MED ORDER — POTASSIUM CHLORIDE CRYS ER 20 MEQ PO TBCR
40.0000 meq | EXTENDED_RELEASE_TABLET | Freq: Once | ORAL | Status: AC
  Administered 2013-01-18: 18:00:00 40 meq via ORAL
  Filled 2013-01-18: qty 2

## 2013-01-18 MED ORDER — METOLAZONE 5 MG PO TABS
5.0000 mg | ORAL_TABLET | ORAL | Status: DC
  Filled 2013-01-18: qty 1

## 2013-01-18 NOTE — Progress Notes (Signed)
PULMONARY  / CRITICAL CARE MEDICINE  Name: [name] MRN: [id number] DOB: June 21, 1950    ADMISSION DATE:  01/11/2013  REFERRING MD :  EDP PRIMARY SERVICE: PCCM  CHIEF COMPLAINT:  SOB  BRIEF PATIENT DESCRIPTION: 62yo male with hx COPD (8L home O2), CHF, pulm HTN, psoriatic arthritis and associated ILD.  Admitted 9/24 with Acute on chronic resp failure in setting AECOPD and decompensated R heart failure.   SIGNIFICANT EVENTS / STUDIES:    LINES / TUBES: none  CULTURES: Sputum 9/24>>>none  ANTIBIOTICS: Levaquin 9/24>>>  Subjective/ Overnight: No new complaints. No distress. Nasal obstruction improved   VITAL SIGNS: Temp:  [97.4 F (36.3 C)-97.6 F (36.4 C)] 97.6 F (36.4 C) (09/29 0434) Pulse Rate:  [77-86] 86 (09/29 0434) Resp:  [20] 20 (09/29 0434) BP: (97-123)/(49-69) 115/69 mmHg (09/29 0434) SpO2:  [88 %-100 %] 91 % (09/29 0841) FiO2 (%):  [50 %] 50 % (09/29 0841) Weight:  [194 lb 14.2 oz (88.4 kg)] 194 lb 14.2 oz (88.4 kg) (09/29 0434) HEMODYNAMICS:   VENTILATOR SETTINGS: Vent Mode:  [-]  FiO2 (%):  [50 %] 50 % INTAKE / OUTPUT: Intake/Output     10/01 0701 - 10/02 0700   P.O. 360   Total Intake(mL/kg) 360 (4.2)   Urine (mL/kg/hr) 1600 (1.4)   Total Output 1600   Net -1240          PHYSICAL EXAMINATION: General:  Chronically ill appearing male, No acute distress. Plethoric.  Neuro: awake, alert, appropriate, OOB in chair HEENT:  Mm dry, no JVD, face ruddy (normal per wife) Cardiovascular:  s1s2 rrr Lungs:  resps even non labored on NRB, coarse crackles throughout.  Abdomen:  Distended, hypoactive bs Musculoskeletal:  Warm and dry,  1+ pitting edema BLE much improved   LABS:  CBC Recent Labs     01/14/13  0500  WBC  14.4*  HGB  15.2  HCT  48.8  PLT  142*   Coag's No results found for this basename: APTT, INR,  in the last 72 hours BMET Recent Labs     01/14/13  0500  01/16/13  0352  NA  136  135  K  4.1  3.2*  CL  95*  83*  CO2   27  41*  BUN  29*  34*  CREATININE  1.21  1.29  GLUCOSE  184*  194*   Electrolytes Recent Labs     01/14/13  0500  01/16/13  0352  CALCIUM  8.6  9.1  MG  2.3   --   PHOS  5.4*   --    Sepsis Markers No results found for this basename: LACTICACIDVEN, PROCALCITON, O2SATVEN,  in the last 72 hours ABG No results found for this basename: PHART, PCO2ART, PO2ART,  in the last 72 hours Liver Enzymes No results found for this basename: AST, ALT, ALKPHOS, BILITOT, ALBUMIN,  in the last 72 hours Cardiac Enzymes Recent Labs     01/14/13  0500  PROBNP  5269.0*   Glucose No results found for this basename: GLUCAP,  in the last 72 hours  Imaging No results found.   ASSESSMENT / PLAN:  PULMONARY Acute on chronic respiratory failure - tolerates NRBM better than CPAP AECOPD ILD - r/t psoriatic arthritis Likely pulm edema component, in setting pulm hytn CXR 9/27- unchanged fibrosis and CE P:   - Cont O2 and titrate as needed - may need to consider high flow O2 as outpt - was on 8L prior. -  Cont to taper pred as tolerated - Continue levaquin for 7-10 days total  - Cont diuresis as BP allows. - Cont BDs - DNR/DNI. - Cont flonase  - attempt [place] home 10/02  CARDIOVASCULAR Acute on chronic R sided heart failure  Severe Pulm HTN - pressures in 70's CAD Cor Pulmonale  P:  - Cont current rx  RENAL Hypokalemia  Chronic renal insufficiency  P:   Monitor BMET intermittently Correct electrolytes as indicated   HEMATOLOGIC Coagulopathy - on xarelto for hx DVT  P:  - F/u cbc. - Cont home xarelto.  INFECTIOUS AECOPD  P:   - Levaquin as above - Hold humira until abx course is complete.  ENDOCRINE No active issue P:   - Monitor glucose on chem with steroids.  NEUROLOGICLight headedness, improved P:   - Monitor.   Merton Border, MD ; [facility] [phone].  After 5:30 PM or weekends, call [phone]

## 2013-01-18 NOTE — Progress Notes (Signed)
Subjective:  No change, up in room on O2  Objective:  Vital Signs in the last 24 hours: Temp:  [97.3 F (36.3 C)-98.4 F (36.9 C)] 97.3 F (36.3 C) (10/01 0549) Pulse Rate:  [75-82] 78 (10/01 0549) Resp:  [20-22] 20 (10/01 0549) BP: (96-108)/(41-72) 102/56 mmHg (10/01 0555) SpO2:  [82 %-91 %] 88 % (10/01 0749) FiO2 (%):  [50 %] 50 % (10/01 0749) Weight:  [189 lb 9.6 oz (86.002 kg)] 189 lb 9.6 oz (86.002 kg) (10/01 0549)  Intake/Output from previous day:  Intake/Output Summary (Last 24 hours) at 01/18/13 0942 Last data filed at 01/18/13 0836  Gross per 24 hour  Intake   1040 ml  Output   2000 ml  Net   -960 ml    Physical Exam: General appearance: alert, cooperative, cyanotic and no distress Lungs: clear to auscultation bilaterally and few crackles Heart: regular rate and rhythm Extremities: trace edema   Rate: 78  Rhythm: normal sinus rhythm and premature ventricular contractions (PVC)  Lab Results: No results found for this basename: WBC, HGB, PLT,  in the last 72 hours  Recent Labs  01/16/13 0352 01/17/13 0535  NA 135 134*  K 3.2* 3.0*  CL 83* 78*  CO2 41* 42*  GLUCOSE 194* 170*  BUN 34* 40*  CREATININE 1.29 1.53*   No results found for this basename: TROPONINI, CK, MB,  in the last 72 hours No results found for this basename: INR,  in the last 72 hours  Imaging: Imaging results have been reviewed  Cardiac Studies:  Assessment/Plan:   Principal Problem:   Acute and chronic respiratory failure Active Problems:   Pulmonary hypertension   Acute on chronic combined systolic and diastolic congestive heart failure   PULMONARY FIBROSIS   Hypoxemia   Chronic right-sided HF (heart failure)   Cor pulmonale, chronic - with acute on Chronic exacerbation   PSORIASIS   ARTHRITIS, RHEUMATOID   Anasarca   CAD - moderate by cath March 2012   Hypokalemia   Chronic anticoagulation- Xarelto (hx of DVT)    PLAN: No labs today. He has diuresed 23 lbs since  admission. I cut his Zaroxolyn back to MWF. Check bmp/bnp in am.  Kerin Ransom PA-C Beeper [phone] 01/18/2013, 9:42 AM   I have seen and examined the patient along with Kerin Ransom, PA .  I have reviewed the chart, notes and new data.  I agree with PA's note.  Key new complaints: gets hypoxemic and dizzy with ambulation (even briefly to bathroom) Key examination changes: cyanotic; edema has completely resolved Key new findings / data: K 3.0 yesterday, creat increased to 1.5, BNP essentially unchanged  PLAN: Agree with decreased diuretic dose. I think he needs another dose of KCl supplementation (no BMET today, recheck in AM). I don't think BNP is likely to be a useful parameter to tailor his diuresis. Use clinical criteria and renal function measures. I think he is at "dry weight".   Sanda Klein, MD, [place] [phone] 01/18/2013, 4:32 PM

## 2013-01-18 NOTE — Progress Notes (Addendum)
Patient unable to use CPAP at this time due to needing 9L oximyzer.

## 2013-01-18 NOTE — Progress Notes (Signed)
PT Cancellation Note  Patient Details Name: [name] MRN: [id number] DOB: 28-Apr-1950   Cancelled Treatment:    Reason Eval/Treat Not Completed: Other (comment) (Pt amb in room independently frequently.  He doesn't feel he can tolerate longer distances at this point.)   [facility] 01/18/2013, 11:22 AM

## 2013-01-18 NOTE — Progress Notes (Addendum)
The patient did not have any complaints overnight nor did he have any acute changes overnight.

## 2013-01-18 NOTE — Progress Notes (Signed)
Trial of Pts home [place] per MD request.  Pt could not tolerate 10 L 02 without humidity. "My nose is going to bleed." On 10 L with humidity Pt Sp02 @ rest = 84, however after walk to bathroom, Sp02 = 70 & did not increase at rest. Pt returned to 50% VM. Sp02 = 84.  Pt states his wife will bring home 02 humidity tonight. Perhaps can try his home humidity system tomorrow.

## 2013-01-19 DIAGNOSIS — J8410 Pulmonary fibrosis, unspecified: Secondary | ICD-10-CM

## 2013-01-19 DIAGNOSIS — I5043 Acute on chronic combined systolic (congestive) and diastolic (congestive) heart failure: Principal | ICD-10-CM

## 2013-01-19 LAB — BASIC METABOLIC PANEL
BUN: 44 mg/dL — ABNORMAL HIGH (ref 6–23)
CO2: 42 mEq/L (ref 19–32)
Calcium: 9.8 mg/dL (ref 8.4–10.5)
Chloride: 75 mEq/L — ABNORMAL LOW (ref 96–112)
Creatinine, Ser: 1.73 mg/dL — ABNORMAL HIGH (ref 0.50–1.35)
GFR calc Af Amer: 46 mL/min — ABNORMAL LOW (ref >90)
GFR calc non Af Amer: 40 mL/min — ABNORMAL LOW (ref >90)
Glucose, Bld: 183 mg/dL — ABNORMAL HIGH (ref 70–99)
Potassium: 2.9 mEq/L — ABNORMAL LOW (ref 3.5–5.1)
Sodium: 132 mEq/L — ABNORMAL LOW (ref 135–145)

## 2013-01-19 LAB — PRO B NATRIURETIC PEPTIDE: Pro B Natriuretic peptide (BNP): 3091 pg/mL — ABNORMAL HIGH (ref 0–125)

## 2013-01-19 MED ORDER — POTASSIUM CHLORIDE CRYS ER 20 MEQ PO TBCR
40.0000 meq | EXTENDED_RELEASE_TABLET | Freq: Once | ORAL | Status: AC
  Administered 2013-01-19: 11:00:00 40 meq via ORAL
  Filled 2013-01-19: qty 2

## 2013-01-19 MED ORDER — FLUTICASONE PROPIONATE 50 MCG/ACT NA SUSP: 2.0000 | Freq: Two times a day (BID) | NASAL | Status: AC

## 2013-01-19 MED ORDER — METOLAZONE 5 MG PO TABS: 5.0000 mg | ORAL_TABLET | ORAL | Status: DC

## 2013-01-19 MED ORDER — LEVOFLOXACIN 750 MG PO TABS: 750.0000 mg | ORAL_TABLET | Freq: Every day | ORAL | Status: DC

## 2013-01-19 NOTE — Progress Notes (Signed)
Utilization Review Completed.   Maze Corniel, RN, BSN Nurse Case Manager  [phone]  

## 2013-01-19 NOTE — Discharge Summary (Signed)
Physician Discharge Summary  Patient ID: [name] MRN: [id number] DOB/AGE: 1950-08-17 62 y.o.  Admit date: 01/11/2013 Discharge date: 01/19/2013  Problem List Principal Problem:   Acute and chronic respiratory failure Active Problems:   PULMONARY FIBROSIS   PSORIASIS   ARTHRITIS, RHEUMATOID   Hypoxemia   Chronic right-sided HF (heart failure)   Pulmonary hypertension   Anasarca   CAD - moderate by cath March 2012   Hypokalemia   Cor pulmonale, chronic - with acute on Chronic exacerbation   Chronic anticoagulation- Xarelto (hx of DVT)   Acute on chronic combined systolic and diastolic congestive heart failure  HPI: 62yo male with hx severe COPD, ILD, CHF, pulm HTN presented 9/24 with 3 day hx worsening SOB, chest pressure, orthopnea, worsening BLE edema. Does have mild non productive cough but this is baseline. No known fevers but did have significant chills this am per wife. Has been sleeping in chair, tried to lie down in bed this am and became acutely worse. Denies hemoptysis, purulent sputum, headache, n/v/d. Pt is DNR/DNI. Hypoxic in ER with sats low 80's on venti mask.     Hospital Course: LINES / TUBES:  none  CULTURES:  Sputum 9/24>>>none  ANTIBIOTICS:  Levaquin 9/24>>>10-4  ASSESSMENT / PLAN:  PULMONARY  Acute on chronic respiratory failure - tolerates NRBM better than CPAP  AECOPD  ILD - r/t psoriatic arthritis  Likely pulm edema component, in setting pulm hytn  CXR 9/27- unchanged fibrosis and CE  P:  - Cont O2 and titrate as needed - may need to consider high flow O2 as outpt - was on 8L prior.  - Cont to taper pred as tolerated  - Continue levaquin for 7-10 days total  - Cont diuresis as BP allows.  - Cont BDs  - DNR/DNI.  - Cont flonase  - attempt [place] home 10/02  CARDIOVASCULAR  Acute on chronic R sided heart failure  Severe Pulm HTN - pressures in 70's  CAD  Cor Pulmonale  P:  - Cont current rx  RENAL  Hypokalemia   Chronic renal insufficiency  P:  Monitor BMET intermittently  Correct electrolytes as indicated  HEMATOLOGIC  Coagulopathy - on xarelto for hx DVT  P:  - F/u cbc.  - Cont home xarelto.  INFECTIOUS  AECOPD  P:  - Levaquin as above  - Hold humira until abx course is complete.  ENDOCRINE  No active issue  P:  - Monitor glucose on chem with steroids.  NEUROLOGICLight headedness, improved  P:  - Monitor.   Per : Dr. Debara Pickett of [facility] Principal Problem:  Acute and chronic respiratory failure  Active Problems:  PULMONARY FIBROSIS  Chronic right-sided HF (heart failure)  Pulmonary hypertension  C O P D  PSORIASIS  ARTHRITIS, RHEUMATOID  Hypoxemia  Anasarca  CAD - moderate by cath March 2012  Hypokalemia  Cor pulmonale, chronic - with acute on Chronic exacerbation  Chronic anticoagulation- Xarelto (hx of DVT)   Plan: Net fluids: -1.7L/-12.4L. Zaroxolyn added 9-30 which seems to have helped. SCr has jumped up. Will recheck 3091 om 01-19-13. Last was 9/27. His weight 9-30 was 190#. I think this is very close to his dry weight. Will change Lasix to PO 24m bid and continue zaroxolyn. Replace K+ now and at 1400hrs.    Labs at discharge Lab Results  Component Value Date   CREATININE 1.73* 01/19/2013   BUN 44* 01/19/2013   NA 132* 01/19/2013   K 2.9* 01/19/2013  CL 75* 01/19/2013   CO2 42* 01/19/2013   Lab Results  Component Value Date   WBC 14.4* 01/14/2013   HGB 15.2 01/14/2013   HCT 48.8 01/14/2013   MCV 90.9 01/14/2013   PLT 142* 01/14/2013   Lab Results  Component Value Date   ALT 44 10/12/2012   AST 31 10/12/2012   ALKPHOS 59 10/12/2012   BILITOT 0.6 10/12/2012   Lab Results  Component Value Date   INR 1.46 07/18/2012   INR 2.35* 10/03/2010   INR 2.99* 10/02/2010    Current radiology studies No results found.  Disposition:  06-Home-Health Care Svc   Future Appointments Provider Department Dept Phone   01/26/2013 10:30 AM Melvenia Needles, NP [facility]  [phone]       Medication List         albuterol (2.5 MG/3ML) 0.083% nebulizer solution  Commonly known as:  PROVENTIL  Take 3 mLs (2.5 mg total) by nebulization every 6 (six) hours as needed for wheezing.     ambrisentan 10 MG tablet  Commonly known as:  LETAIRIS  Take 1 tablet (10 mg total) by mouth daily.     b complex vitamins tablet  Take 1 tablet by mouth daily.     calcium-vitamin D 250-125 MG-UNIT per tablet  Commonly known as:  OSCAL  Take 1 tablet by mouth 2 (two) times daily.     cetirizine 10 MG tablet  Commonly known as:  ZYRTEC  Take 10 mg by mouth daily.     fluticasone 50 MCG/ACT nasal spray  Commonly known as:  FLONASE  Place 2 sprays into the nose 2 (two) times daily.     folic acid 1 MG tablet  Commonly known as:  FOLVITE  Take 1 mg by mouth daily.     furosemide 40 MG tablet  Commonly known as:  LASIX  TAKE 2 TABLETS BY MOUTH TWICE A DAY     HUMIRA 40 MG/0.8ML injection  Generic drug:  adalimumab  Inject 40 mg into the skin every 14 (fourteen) days.     levofloxacin 750 MG tablet  Commonly known as:  LEVAQUIN  Take 1 tablet (750 mg total) by mouth daily.     metolazone 5 MG tablet  Commonly known as:  ZAROXOLYN  Take 1 tablet (5 mg total) by mouth 3 (three) times a week.  Start taking on:  01/20/2013     multivitamin capsule  Take 1 capsule by mouth daily.     OXYGEN-HELIUM IN  8+ Liters continuous inhalation     pantoprazole 40 MG tablet  Commonly known as:  PROTONIX  Take 40 mg by mouth daily.     potassium chloride SA 20 MEQ tablet  Commonly known as:  K-DUR,KLOR-CON  Take 40 mEq by mouth 2 (two) times daily.     predniSONE 10 MG tablet  Commonly known as:  DELTASONE  Take 20 mg by mouth daily.     simvastatin 20 MG tablet  Commonly known as:  ZOCOR  Take 20 mg by mouth at bedtime.     TACLONEX EX  Apply 1 application topically daily as needed.     tiotropium 18 MCG inhalation capsule  Commonly known as:  SPIRIVA   Place 1 capsule (18 mcg total) into inhaler and inhale daily.     TYLENOL ARTHRITIS PAIN 650 MG CR tablet  Generic drug:  acetaminophen  Take 1,300 mg by mouth 2 (two) times daily.     TYVASO  IN  Inhale 12 puffs into the lungs 4 (four) times daily.     XARELTO 20 MG Tabs tablet  Generic drug:  Rivaroxaban  Take 20 mg by mouth daily.           Follow-up Information   Follow up with PARRETT,TAMMY, NP On 01/26/2013. (10:30am )    Specialty:  Nurse Practitioner   Contact information:   [address] [place] [phone]        Discharged Condition: poor  Time spent on discharge greater than 40 minutes.  Vital signs at Discharge. Temp:  [97.1 F (36.2 C)-97.6 F (36.4 C)] 97.1 F (36.2 C) (10/02 0459) Pulse Rate:  [64-93] 78 (10/02 0459) Resp:  [18-20] 18 (10/02 0459) BP: (96-118)/(58-74) 96/58 mmHg (10/02 0459) SpO2:  [80 %-90 %] 90 % (10/02 0755) FiO2 (%):  [50 %] 50 % (10/01 1613) Office follow up Special Information or instructions. He will follow up with T. Parrett NP. Note now on zaroxolyn for heart failure. Potassium supplements restarted. We have ordered a bmet on his follow up visit. Signed: Richardson Landry Minor ACNP Maryanna Shape PCCM Pager [phone] till 3 pm If no answer page [phone] 01/19/2013, 8:35 AM     Agree  Merton Border, MD ; [facility] service Mobile [phone].  After 5:30 PM or weekends, call [phone]

## 2013-01-19 NOTE — Progress Notes (Signed)
RT increased patients oximizer flow to 10L.  Patients SATS when laying down in bed were only 80%.  Patients SATS increased to 85%.  RT will continue to monitor and RN is aware.

## 2013-01-19 NOTE — Progress Notes (Signed)
Plan for discharge and follow up noted, we will see prn.  Kerin Ransom PA-C 01/19/2013 9:12 AM

## 2013-01-20 ENCOUNTER — Telehealth: Payer: Self-pay | Admitting: Emergency Medicine

## 2013-01-20 NOTE — Telephone Encounter (Signed)
I spoke with spouse. She stated when EMS came out to get pt they did not return pt DNR paper. She called the hospital and they are going to look for it. I advised her RB will be back in the office on 01/24/13 to have him sign another one. She voiced her understanding and will forward to [place].

## 2013-01-24 NOTE — Telephone Encounter (Signed)
Advised pt form is ready and he will pick up on 01/26/13 at appt gave to [place]

## 2013-01-24 NOTE — Telephone Encounter (Signed)
Let Mr Balles know that I have filled out the form and they can pick it up.

## 2013-01-26 ENCOUNTER — Ambulatory Visit (INDEPENDENT_AMBULATORY_CARE_PROVIDER_SITE_OTHER): Payer: BC Managed Care – PPO | Admitting: Adult Health

## 2013-01-26 ENCOUNTER — Encounter: Payer: Self-pay | Admitting: Adult Health

## 2013-01-26 ENCOUNTER — Inpatient Hospital Stay: Payer: BC Managed Care – PPO | Admitting: Adult Health

## 2013-01-26 ENCOUNTER — Other Ambulatory Visit (INDEPENDENT_AMBULATORY_CARE_PROVIDER_SITE_OTHER): Payer: BC Managed Care – PPO

## 2013-01-26 VITALS — BP 134/82 | HR 73 | Temp 98.8°F | Ht 68.0 in | Wt 181.0 lb

## 2013-01-26 DIAGNOSIS — I2789 Other specified pulmonary heart diseases: Secondary | ICD-10-CM

## 2013-01-26 DIAGNOSIS — I272 Pulmonary hypertension, unspecified: Secondary | ICD-10-CM

## 2013-01-26 DIAGNOSIS — Z23 Encounter for immunization: Secondary | ICD-10-CM

## 2013-01-26 DIAGNOSIS — I2781 Cor pulmonale (chronic): Secondary | ICD-10-CM

## 2013-01-26 DIAGNOSIS — I279 Pulmonary heart disease, unspecified: Secondary | ICD-10-CM

## 2013-01-26 LAB — BASIC METABOLIC PANEL
CO2: 47 mEq/L — ABNORMAL HIGH (ref 19–32)
Calcium: 8.6 mg/dL (ref 8.4–10.5)
Creatinine, Ser: 1.6 mg/dL — ABNORMAL HIGH (ref 0.4–1.5)
Glucose, Bld: 235 mg/dL — ABNORMAL HIGH (ref 70–99)

## 2013-01-26 NOTE — Assessment & Plan Note (Signed)
Severe Pulmonary HTN- cont w/ Letaris and Tyvaso  Cont on O2 -sats adequate w/ oximizer

## 2013-01-26 NOTE — Patient Instructions (Addendum)
Decrease Zaroxyln 64m Twice weekly (Mon and Fri) , next Dose is on Monday 01/30/13  I will call with lab results.  Continue on prednisone 217mdaily .  Follow up Dr. ByLamonte Sakai weeks and As needed   Please contact office for sooner follow up if symptoms do not improve or worsen or seek emergency care

## 2013-01-26 NOTE — Progress Notes (Signed)
Subjective:    Patient ID: [name], male    DOB: 16-Mar-1951, 62 y.o.   MRN: [id number] HPI 63 yo man, former smoker with COPD, hx psoriatic arthritis and associated ILD, profound hypoxemia and associated Pulm HTN (PAP 57/26 mean 37, PAOP 14/11 mean 7 by cath 10/04/09. 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 Admitted 6/21-27 with resp failure and decompensated R heart failure. He was diuresed, treated with corticosteroids and started on bronchodilators for his COPD. O2 was titrated to 5L/min at rest, he is leaving it on 5 with exertion.   ROV 02/05/10 -- 62yo, PAH with Hx psoriatic arthritis, ILD and R heart failure, hypoxemia, on chronic pred 88m once daily. Also hx COPD.  On letaris since September '11. Since last time breathing may be a bit better. He has good days and bad days. Rarely uses ventolin. No HA, no syncope or near syncope. LE edema has been well controlled and stable. Was desaturated while walking today on presentation. Has been gaining some wt, aldactone was decreased by Dr SElisabeth Caraa month ago. PFTs done today.   ROV 04/16/10 -- Hx as above, follows up after TTE and 6 min walk. Returns feeling a bit more SOB w exertion. 6 min walk distance = 1639[place] with significant desat to 75% (after 2 min). Estimated RVSP 7972m(up from 59 in 09/2009). Has been on letaris since September. Edema seems better on lasix 56m75mo times a day. Clearly both TTE and walk distance are worse.   ROV 05/19/10 -- returns for f/u of the above issues. last time we started Tyvaso in addition to his letaris. Has been on it for a month. Up to 9 puffs 4x a day. Has been assoc with some HA, occas cough right after doing it, not intrusive. On Pred 10mg70miriva + ventoilin as needed. He is not noticing any big changes in his breathing yet on the Tyvaso. Monthly LFT's normal 1/17.   ROV 07/29/10 -- COPD, psoriatic arthritis + ILD, PAH. Regimen is Tyvaso + Letaris. [facility] They have told him he needs screening CSY, EtOH and tobacco random screening, counseling. Tells me today that his breathing is stable, although he has had trouble w allergies and w the pollen. Taking spiriva + SABA. O2 is set on 6L/min via oximizer.   ROV 09/23/10 -- COPD, psoriatic arthritis + ILD, PAH. He is feeling well, has been doing pulm rehab. Has been maintained on Tyvaso, Letaris. [facility] >> cont on current regimen  POST HOSP 10/10/10  Pt returns for post hospital follow up  Pt was admitted   6/11-6/15 for acute on chronic resp failure w/ underlying PF/RA/PAH.  CT chest was neg for PE, chronic changes along with diffuse ASPDZ -?alveolitis vs pulm edema. BNP >2000 . At discharge BNP 548.  He did receive increased steroids and diuresis along pulmonary hygiene.   Since discharge he is better but still remains weak and wears out easily.  Ankles are still puffy esp in the evening. He says he is off his spironolactone but unclear why-no mention in discharge summary why this was held   Today BMET shows K+ 3.7    ROV 10/24/10 -- COPD, hx psoriatic arthritis and associated ILD, profound hypoxemia and associated Pulm HTN (PAP 57/26 mean 37, PAOP 14/11 mean 7 by cath 10/04/09. 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 Admitted  6/11 - 15, still quite limited, not back to full speed.  Biggest complaint is exertional dyspnea and hypoxemia - to 60's on oximizer 6L/min. He continues to have exertional hypoxemia.   ROV 01/20/11 -- COPD, hx psoriatic arthritis (Dr Tonia Brooms) and associated ILD, profound hypoxemia and associated Pulm HTN (PAP 57/26 mean 37, PAOP 14/11 mean 7 by cath 10/04/09). 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 We had him evaluated for possible transplant, turned down at [facility]. Using 5-6L/min via oximizer at rest, up to 8-10L/min when moving. Using Spiriva and albuterol prn. Continues to have exertional SOB, may be worse. He is coughing more,  has nasal congestion last few days. Taking zyrtec. He is using some saline. Has humidity in his O2 concentrator. Still in the [facility] pulm rehab maintenance program.    ROV 05/18/11 -- COPD, hx psoriatic arthritis (Dr Tonia Brooms) and associated ILD, profound hypoxemia and associated Pulm HTN (PAP 57/26 mean 37, PAOP 14/11 mean 7 by cath 10/04/09). 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 Cough well controlled. He is on Letaris + Tyvaso. He had R heart cath at [facility] in Spring 2012, I don't have data. Needs 6 minute walk.   ROV 08/20/11 -- COPD, hx psoriatic arthritis (Dr Tonia Brooms) and associated ILD, profound hypoxemia and associated Pulm HTN (PAP 57/26 mean 37, PAOP 14/11 mean 7 by cath 10/04/09). 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  6 min walk distance 146mtoday. 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 Couldn't walk as far today, feels more dyspneic overall. Using 6L/min per oximizer. [facility] xarel[facility]. On Spiriva + albuterol. Pt wants me to stop his aldactone and increase lasix, doesn't like the aldactone because of gynecomastia.   ROV 10/05/11 -- COPD, hx psoriatic arthritis (Dr GTonia Brooms and associated ILD, profound hypoxemia and associated Pulm HTN (PAP 57/26 mean 37, PAOP 14/11 mean 7 by cath 10/04/09). 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 Has spoken with CAldrichabout increasing his Tyvaso to 12 puffs qid. I did not do this because I don't know of any studies to support. Still occasionally does the rehab maintenance program. LFT done today.   ROV 11/25/11 -- COPD, hx psoriatic arthritis (Dr GTonia Brooms and associated ILD, profound hypoxemia and associated Pulm HTN (PAP 57/26 mean 37, PAOP 14/11 mean 7 by cath 10/04/09). 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 Tyvaso increased to 12 puffs 4x a day in 6/13 but not sure if it has helped, remains on xarelto and letaris 5. After wt gain of 10 lbs and more SOB, lasix  temporarily increase to 856mbid, then back down to 4026mid. He felt that his breathing was better when he was < 190 lbs, currently at 194lbs.   ROV 12/25/11 -- COPD, hx psoriatic arthritis (Dr GruTonia Broomsnd associated ILD, profound hypoxemia and associated Pulm HTN. On Letaris + Tyvaso. He remains hypoxic with exertion, even on oximizer.  Last time we continued 12 puffs tyvaso. He feels stable, able to exert some. Since last time [facility] in coming months. Wants the flu shot.   ROV 03/11/12 -- COPD, hx psoriatic arthritis (Dr GruTonia Broomsnd associated ILD, profound hypoxemia and associated Pulm HTN. On Letaris + Tyvaso 12 puffs. He remains hypoxic with exertion, even  on oximizer. Due for 6 minute walk in March. He has been having trouble with nose dryness, bleeding, due to high flow O2. He has tried nasal saline, saline gel, etc. Still very problematic.  His breathing has been fairly stable.   ROV 05/17/12 -- COPD, hx psoriatic arthritis (Dr Tonia Brooms) and associated ILD, profound hypoxemia and associated Pulm HTN. On Letaris + Tyvaso 12 puffs. Has been very difficult to keep him oxygenated, he is having progressive dyspnea. Has been seen at [facility] for transplant but not for [place]. He has more SOB, now unable to walk up the stairs - has started sleeping downstairs/. He uses albuterol tid (an increase) since last month.   ROV 06/22/12 -- COPD, hx psoriatic arthritis (Dr Tonia Brooms) and associated ILD, profound hypoxemia and associated Pulm HTN. On Letaris + Tyvaso 12 puffs.  He is profoundly hypoxemic and I suspect he needs IV therapy. He is still having problems with epistaxis, has seen ENT - due to his high flow O2. Last time we increased pred to 70m empirically - he may be recovering more quickly after exertion. Last TTE PASP was 50-60, 12/23/11.   [facility]  [facility]9 for acute resp failure w/ bronchitis, superimposed on ILD associated w/ PAH w/  decompensated cor pulmonale .  Tx w/ abx, steroids , diuretics.  Since discharge he is feeling some better. But still weak no energy.  Some ankle edema worse in evening.  No hemoptysis, chest pain , fever or n/v.  Letaris was increased during admission , needs rx sent to pharmacy.  Currenlty on tapering dose of steorids, baseline is 180mdaily   08/08/12 -- chronic resp failure and severe PAH due to ILD, obstructive disease. He was admitted recently w decompensation and hypoxemia, ? After a URI. We increased letaris to 1014motherwise no real changes in therapy. Steroids tapering to 25m85med daily.  He reports more LE and foot edema. For the last week he has increased his lasix to 80mg51m.   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 He has severe chronic hypoxemia. He is on lasix 80 bid, tyvaso 12 puffs, letaris 10, pred 20, humira. We tried going down to pred 10, but his breathing worsened so we went back to 20 - he improved. His edema is better on the increased lasix.   [facility]   [facility] for a post hospital followup. 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 He was treated with IV antibiotics, and discharged on Levaquin for a total of 10 days. Therapy. Although the prednisone taper. He was diuresed >23 lbs during admission.  Has severe pulmonary HTN on Letaris and tyvaso .  Remains on xarelto daily  Was started on zaroxyln 5mg 355mweek.  Pt says his breathing is better .  Complains that he so weak he can gets lightheaded standing. Has no swelling . Weight is down another 8 lbs since discharge .   Objective:   Physical Exam Filed Vitals:   01/26/13 1553  BP: 134/82  Pulse: 73  Temp: 98.8 F (37.1 C)   Gen: Pleasant,  hyperpigmented face, no distress  ENT: No lesions,  mouth clear,  oropharynx clear, no postnasal drip, throat clearing. Nasal mucosa red and swollen bilaterally  Neck: No JVD, no TMG, no  carotid bruits  Lungs: No use of accessory muscles, Bibasilar insp crackles. No wheeze  Cardiovascular: RRR, heart sounds normal, no murmur or gallops, no  edema  Musculoskeletal: No deformities, no cyanosis or clubbing  Neuro: alert, non focal  Skin: Warm, no lesions or rashes, hyperpigmented face psoriatic patches along arms and legs    Assessment & Plan:  No problem-specific assessment & plan notes found for this encounter.

## 2013-01-26 NOTE — Addendum Note (Signed)
Addended by: Parke Poisson E on: 01/26/2013 05:42 PM   Modules accepted: Orders

## 2013-01-26 NOTE — Assessment & Plan Note (Signed)
Recent admission with decompensated combined systolic and diastolic CHF -improved with successful diuresis however it appears that he to weak with aggressive diuretics.  Will check labs with bmet and bnp  Cut back on diuretics.   Plan Decrease zaroxlyn 24m 2 x week.  Low salt diet  Labs today  follow up 3 weeks and As needed

## 2013-01-27 ENCOUNTER — Telehealth: Payer: Self-pay | Admitting: Adult Health

## 2013-01-27 ENCOUNTER — Other Ambulatory Visit: Payer: Self-pay | Admitting: Emergency Medicine

## 2013-01-27 DIAGNOSIS — J8410 Pulmonary fibrosis, unspecified: Secondary | ICD-10-CM

## 2013-01-27 DIAGNOSIS — R0609 Other forms of dyspnea: Secondary | ICD-10-CM

## 2013-01-27 LAB — CBC WITH DIFFERENTIAL/PLATELET
Basophils Absolute: 0 10*3/uL (ref 0.0–0.1)
Basophils Relative: 0 % (ref 0.0–3.0)
Eosinophils Relative: 0.1 % (ref 0.0–5.0)
HCT: 50.7 % (ref 39.0–52.0)
Hemoglobin: 16.5 g/dL (ref 13.0–17.0)
Lymphs Abs: 0.4 10*3/uL — ABNORMAL LOW (ref 0.7–4.0)
MCHC: 32.5 g/dL (ref 30.0–36.0)
MCV: 85.3 fl (ref 78.0–100.0)
Monocytes Absolute: 0.7 10*3/uL (ref 0.1–1.0)
Monocytes Relative: 4.5 % (ref 3.0–12.0)
Neutro Abs: 14.9 10*3/uL — ABNORMAL HIGH (ref 1.4–7.7)
Platelets: 95 10*3/uL — ABNORMAL LOW (ref 150.0–400.0)
RBC: 5.95 Mil/uL — ABNORMAL HIGH (ref 4.22–5.81)
WBC: 16 10*3/uL — ABNORMAL HIGH (ref 4.5–10.5)

## 2013-01-27 NOTE — Telephone Encounter (Signed)
Yes

## 2013-01-27 NOTE — Addendum Note (Signed)
Addended by: Parke Poisson E on: 01/27/2013 05:24 PM   Modules accepted: Orders

## 2013-01-27 NOTE — Telephone Encounter (Signed)
appt scheduled with TP 10.13.14 @ 2.30pm STAT labs ordered for same day

## 2013-01-27 NOTE — Telephone Encounter (Signed)
I spoke with cindy. She stated pt had medicare as a payer. Order for cardiopulmonary assessment and disease process education in order to get the nursing visits approved. She is going to send over referral form that has all this information on it. She needs the order sent to [facility] and it also will need Dr. Lamonte Sakai co signature on it as well. Please advise TP if okay to send in order for cardiopulmonary assessment and diease process education? thanks

## 2013-01-27 NOTE — Telephone Encounter (Signed)
Hold zarolxyn for now  Cut back on Lasix 13m 2 in am and 1 in pm  Ov on 01/30/13 2 :30 with stat bmet.  Increase KDur 455m Three times a day   Pt advised of labs ,overdiuresis w/ electrolyte imbalances  Ov in 3 days , to recheck labs  Please contact office for sooner follow up if symptoms do not improve or worsen or seek emergency care  Wife aware

## 2013-01-27 NOTE — Telephone Encounter (Signed)
Order has been sent to [facility]. Nothing further needed

## 2013-01-30 ENCOUNTER — Encounter: Payer: Self-pay | Admitting: Adult Health

## 2013-01-30 ENCOUNTER — Ambulatory Visit (INDEPENDENT_AMBULATORY_CARE_PROVIDER_SITE_OTHER): Payer: BC Managed Care – PPO | Admitting: Adult Health

## 2013-01-30 ENCOUNTER — Other Ambulatory Visit (INDEPENDENT_AMBULATORY_CARE_PROVIDER_SITE_OTHER): Payer: BC Managed Care – PPO

## 2013-01-30 VITALS — BP 122/74 | HR 76 | Temp 96.9°F | Ht 68.0 in | Wt 192.0 lb

## 2013-01-30 DIAGNOSIS — IMO0002 Reserved for concepts with insufficient information to code with codable children: Secondary | ICD-10-CM | POA: Insufficient documentation

## 2013-01-30 DIAGNOSIS — H579 Unspecified disorder of eye and adnexa: Secondary | ICD-10-CM

## 2013-01-30 DIAGNOSIS — E1139 Type 2 diabetes mellitus with other diabetic ophthalmic complication: Secondary | ICD-10-CM

## 2013-01-30 DIAGNOSIS — I5043 Acute on chronic combined systolic (congestive) and diastolic (congestive) heart failure: Secondary | ICD-10-CM

## 2013-01-30 DIAGNOSIS — I509 Heart failure, unspecified: Secondary | ICD-10-CM

## 2013-01-30 DIAGNOSIS — R0609 Other forms of dyspnea: Secondary | ICD-10-CM

## 2013-01-30 LAB — BASIC METABOLIC PANEL
BUN: 21 mg/dL (ref 6–23)
CO2: 39 mEq/L — ABNORMAL HIGH (ref 19–32)
Calcium: 9 mg/dL (ref 8.4–10.5)
GFR: 79.85 mL/min (ref >60.00)
Glucose, Bld: 281 mg/dL — ABNORMAL HIGH (ref 70–99)

## 2013-01-30 MED ORDER — GLIMEPIRIDE 2 MG PO TABS: 2.0000 mg | ORAL_TABLET | Freq: Every day | ORAL | Status: AC

## 2013-01-30 NOTE — Assessment & Plan Note (Addendum)
Recent acute on chronic Systolic/Diastolic CHF with over diuresis resulting in electrolyte imbalances. Pt has multiple comorbidities making it difficulty to find the right balance of fluid that he is not so weak from diuresis vs volume overload.  Will back off on zaroxlyn and lasix in hope on keeping neg /even balance.  Check bmet on return   Plan  Decrease Zaroxyln 51m 1/2 tab Twice weekly (Mon and Fri) Continue on Furosemide 472m2 tabs Twice daily  .  Call if weight goes up >5 lbs  Continue on prednisone 2075maily .  Follow up Dr. ByrLamonte Sakaiweeks and As needed   Please contact office for sooner follow up if symptoms do not improve or worsen or seek emergency care

## 2013-01-30 NOTE — Assessment & Plan Note (Signed)
DM with hyperglycemia  New Dx .  Will need to get in to PCP this week Labs and note faxed to PCP   Plan   need ov with family doctor ASAP  Low sweet diet  Begin Amaryl 25m daily with meal-do not take if not eaten.  Follow up Dr. BLamonte Sakai2 weeks and As needed   Please contact office for sooner follow up if symptoms do not improve or worsen or seek emergency care

## 2013-01-30 NOTE — Progress Notes (Signed)
Subjective:    Patient ID: [name], male    DOB: 16-Mar-1951, 62 y.o.   MRN: [id number] HPI 62 yo man, former smoker with COPD, hx psoriatic arthritis and associated ILD, profound hypoxemia and associated Pulm HTN (PAP 57/26 mean 37, PAOP 14/11 mean 7 by cath 10/04/09. 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 Admitted 6/21-27 with resp failure and decompensated R heart failure. He was diuresed, treated with corticosteroids and started on bronchodilators for his COPD. O2 was titrated to 5L/min at rest, he is leaving it on 5 with exertion.   ROV 02/05/10 -- 62yo, PAH with Hx psoriatic arthritis, ILD and R heart failure, hypoxemia, on chronic pred 88m once daily. Also hx COPD.  On letaris since September '11. Since last time breathing may be a bit better. He has good days and bad days. Rarely uses ventolin. No HA, no syncope or near syncope. LE edema has been well controlled and stable. Was desaturated while walking today on presentation. Has been gaining some wt, aldactone was decreased by Dr SElisabeth Caraa month ago. PFTs done today.   ROV 04/16/10 -- Hx as above, follows up after TTE and 6 min walk. Returns feeling a bit more SOB w exertion. 6 min walk distance = 1639[place] with significant desat to 75% (after 2 min). Estimated RVSP 7972m(up from 59 in 09/2009). Has been on letaris since September. Edema seems better on lasix 56m75mo times a day. Clearly both TTE and walk distance are worse.   ROV 05/19/10 -- returns for f/u of the above issues. last time we started Tyvaso in addition to his letaris. Has been on it for a month. Up to 9 puffs 4x a day. Has been assoc with some HA, occas cough right after doing it, not intrusive. On Pred 10mg70miriva + ventoilin as needed. He is not noticing any big changes in his breathing yet on the Tyvaso. Monthly LFT's normal 1/17.   ROV 07/29/10 -- COPD, psoriatic arthritis + ILD, PAH. Regimen is Tyvaso + Letaris. [facility] They have told him he needs screening CSY, EtOH and tobacco random screening, counseling. Tells me today that his breathing is stable, although he has had trouble w allergies and w the pollen. Taking spiriva + SABA. O2 is set on 6L/min via oximizer.   ROV 09/23/10 -- COPD, psoriatic arthritis + ILD, PAH. He is feeling well, has been doing pulm rehab. Has been maintained on Tyvaso, Letaris. [facility] >> cont on current regimen  POST HOSP 10/10/10  Pt returns for post hospital follow up  Pt was admitted   6/11-6/15 for acute on chronic resp failure w/ underlying PF/RA/PAH.  CT chest was neg for PE, chronic changes along with diffuse ASPDZ -?alveolitis vs pulm edema. BNP >2000 . At discharge BNP 548.  He did receive increased steroids and diuresis along pulmonary hygiene.   Since discharge he is better but still remains weak and wears out easily.  Ankles are still puffy esp in the evening. He says he is off his spironolactone but unclear why-no mention in discharge summary why this was held   Today BMET shows K+ 3.7    ROV 10/24/10 -- COPD, hx psoriatic arthritis and associated ILD, profound hypoxemia and associated Pulm HTN (PAP 57/26 mean 37, PAOP 14/11 mean 7 by cath 10/04/09. 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 Admitted  6/11 - 15, still quite limited, not back to full speed.  Biggest complaint is exertional dyspnea and hypoxemia - to 60's on oximizer 6L/min. He continues to have exertional hypoxemia.   ROV 01/20/11 -- COPD, hx psoriatic arthritis (Dr Tonia Brooms) and associated ILD, profound hypoxemia and associated Pulm HTN (PAP 57/26 mean 37, PAOP 14/11 mean 7 by cath 10/04/09). 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 We had him evaluated for possible transplant, turned down at [facility]. Using 5-6L/min via oximizer at rest, up to 8-10L/min when moving. Using Spiriva and albuterol prn. Continues to have exertional SOB, may be worse. He is coughing more,  has nasal congestion last few days. Taking zyrtec. He is using some saline. Has humidity in his O2 concentrator. Still in the [facility] pulm rehab maintenance program.    ROV 05/18/11 -- COPD, hx psoriatic arthritis (Dr Tonia Brooms) and associated ILD, profound hypoxemia and associated Pulm HTN (PAP 57/26 mean 37, PAOP 14/11 mean 7 by cath 10/04/09). 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 Cough well controlled. He is on Letaris + Tyvaso. He had R heart cath at [facility] in Spring 2012, I don't have data. Needs 6 minute walk.   ROV 08/20/11 -- COPD, hx psoriatic arthritis (Dr Tonia Brooms) and associated ILD, profound hypoxemia and associated Pulm HTN (PAP 57/26 mean 37, PAOP 14/11 mean 7 by cath 10/04/09). 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  6 min walk distance 146mtoday. 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 Couldn't walk as far today, feels more dyspneic overall. Using 6L/min per oximizer. [facility]arel[facility]. On Spiriva + albuterol. Pt wants me to stop his aldactone and increase lasix, doesn't like the aldactone because of gynecomastia.   ROV 10/05/11 -- COPD, hx psoriatic arthritis (Dr GTonia Brooms and associated ILD, profound hypoxemia and associated Pulm HTN (PAP 57/26 mean 37, PAOP 14/11 mean 7 by cath 10/04/09). 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 Has spoken with CAldrichabout increasing his Tyvaso to 12 puffs qid. I did not do this because I don't know of any studies to support. Still occasionally does the rehab maintenance program. LFT done today.   ROV 11/25/11 -- COPD, hx psoriatic arthritis (Dr GTonia Brooms and associated ILD, profound hypoxemia and associated Pulm HTN (PAP 57/26 mean 37, PAOP 14/11 mean 7 by cath 10/04/09). 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 Tyvaso increased to 12 puffs 4x a day in 6/13 but not sure if it has helped, remains on xarelto and letaris 5. After wt gain of 10 lbs and more SOB, lasix  temporarily increase to 856mbid, then back down to 4026mid. He felt that his breathing was better when he was < 190 lbs, currently at 194lbs.   ROV 12/25/11 -- COPD, hx psoriatic arthritis (Dr GruTonia Broomsnd associated ILD, profound hypoxemia and associated Pulm HTN. On Letaris + Tyvaso. He remains hypoxic with exertion, even on oximizer.  Last time we continued 12 puffs tyvaso. He feels stable, able to exert some. Since last time he [facility] in coming months. Wants the flu shot.   ROV 03/11/12 -- COPD, hx psoriatic arthritis (Dr GruTonia Broomsnd associated ILD, profound hypoxemia and associated Pulm HTN. On Letaris + Tyvaso 12 puffs. He remains hypoxic with exertion, even  on oximizer. Due for 6 minute walk in March. He has been having trouble with nose dryness, bleeding, due to high flow O2. He has tried nasal saline, saline gel, etc. Still very problematic.  His breathing has been fairly stable.   ROV 05/17/12 -- COPD, hx psoriatic arthritis (Dr Tonia Brooms) and associated ILD, profound hypoxemia and associated Pulm HTN. On Letaris + Tyvaso 12 puffs. Has been very difficult to keep him oxygenated, he is having progressive dyspnea. Has been seen at [facility] for transplant but not for [place]. He has more SOB, now unable to walk up the stairs - has started sleeping downstairs/. He uses albuterol tid (an increase) since last month.   ROV 06/22/12 -- COPD, hx psoriatic arthritis (Dr Tonia Brooms) and associated ILD, profound hypoxemia and associated Pulm HTN. On Letaris + Tyvaso 12 puffs.  He is profoundly hypoxemic and I suspect he needs IV therapy. He is still having problems with epistaxis, has seen ENT - due to his high flow O2. Last time we increased pred to 20m empirically - he may be recovering more quickly after exertion. Last TTE PASP was 50-60, 12/23/11.   [facility]  [facility]9 for acute resp failure w/ bronchitis, superimposed on ILD associated w/ PAH w/  decompensated cor pulmonale .  Tx w/ abx, steroids , diuretics.  Since discharge he is feeling some better. But still weak no energy.  Some ankle edema worse in evening.  No hemoptysis, chest pain , fever or n/v.  Letaris was increased during admission , needs rx sent to pharmacy.  Currenlty on tapering dose of steorids, baseline is 177mdaily   08/08/12 -- chronic resp failure and severe PAH due to ILD, obstructive disease. He was admitted recently w decompensation and hypoxemia, ? After a URI. We increased letaris to 1031motherwise no real changes in therapy. Steroids tapering to 66m44med daily.  He reports more LE and foot edema. For the last week he has increased his lasix to 80mg82m.   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 He has severe chronic hypoxemia. He is on lasix 80 bid, tyvaso 12 puffs, letaris 10, pred 20, humira. We tried going down to pred 10, but his breathing worsened so we went back to 20 - he improved. His edema is better on the increased lasix.   [facility]   [facility] for a post hospital followup. 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 He was treated with IV antibiotics, and discharged on Levaquin for a total of 10 days. Therapy. Although the prednisone taper. He was diuresed >23 lbs during admission.  Has severe pulmonary HTN on Letaris and tyvaso .  Remains on xarelto daily  Was started on zaroxyln 5mg 328mweek.  Pt says his breathing is better .  Complains that he so weak he can gets lightheaded standing. Has no swelling . Weight is down another 8 lbs since discharge . >labs w/  Electrolyte imbalances , zarolyn held and lasix decreased   01/30/2013 Follow up  3 day follow up - reports breathing doing well since  last ov.  had a good day on Saturday with more energy and appetite Seen for a post hospital follow up last week for decompensated acute on chronic systolic/diastolic CHF .  >23 lbs  was pulled off. Labs showed Na124 , K+2.9 , serum hco3 47 and scr 1.6. His zaroxyln was held. Lasix was decreased 48m in am and 434min pm.  Breathing does feel better. Weight is trending up . Does not feel as weak and lightheaded. Appetite picked up  Does complain that can not read small print as well, appears blurry and hearing in left ear not as good. No loss of vision . No chest pain or increased leg swelling  Labs repeated today with shows NA improved at 130 , K 4.9 and scr down 1.0.  BS was 281 . He does not carry dx of DM -not on meds.  BNP did rise 309>771 (down from recent admit 5000) .  No polyuria or polydipsia.  Last ov [facility] for assistance in home.     ROS:  Constitutional:   No  weight loss, night sweats,  Fevers, chills, +fatigue, or  lassitude.  HEENT:   No headaches,  Difficulty swallowing,  Tooth/dental problems, or  Sore throat,                No sneezing, itching, ear ache, nasal congestion, post nasal drip,   CV:  No chest pain,  Orthopnea, PND, swelling in lower extremities, anasarca, dizziness, palpitations, syncope.   GI  No heartburn, indigestion, abdominal pain, nausea, vomiting, diarrhea, change in bowel habits, loss of appetite, bloody stools.   Resp: No shortness of breath with exertion or at rest.  No excess mucus, no productive cough,  No non-productive cough,  No coughing up of blood.  No change in color of mucus.  No wheezing.  No chest wall deformity  Skin: no rash or lesions.  GU: no dysuria, change in color of urine, no urgency or frequency.  No flank pain, no hematuria   MS:  No joint pain or swelling.  No decreased range of motion.  No back pain.  Psych:  No change in mood or affect. No depression or anxiety.  No memory loss.       Objective:   Physical  Exam Filed Vitals:   01/30/13 1428  BP: 122/74  Pulse: 76  Temp: 96.9 F (36.1 C)   Gen: Pleasant, hyperpigmented face, no distress  ENT: No lesions,  mouth clear,  oropharynx clear, no postnasal drip, throat clearing. Nasal mucosa red and swollen bilaterally  Neck: No JVD, no TMG, no carotid bruits  Lungs: No use of accessory muscles, Bibasilar insp crackles. No wheeze  Cardiovascular: RRR, heart sounds normal, no murmur or gallops, no  edema  Musculoskeletal: No deformities, no cyanosis or clubbing  Neuro: alert, non focal  Skin: Warm, no lesions or rashes, hyperpigmented face psoriatic patches along arms and legs    Assessment & Plan:  No problem-specific assessment & plan notes found for this encounter.

## 2013-01-30 NOTE — Patient Instructions (Addendum)
Decrease Zaroxyln 34m 1/2 tab Twice weekly (Mon and Fri) Continue on Furosemide 456m2 tabs Twice daily  .  Call if weight goes up >5 lbs  Continue on prednisone 2027maily .  You have new onset Diabetes , need ov with family doctor ASAP  Low sweet diet  Begin Amaryl 2mg42mily with meal-do not take if not eaten.  Follow up Dr. ByruLamonte Sakaieeks and As needed   Please contact office for sooner follow up if symptoms do not improve or worsen or seek emergency care

## 2013-02-01 ENCOUNTER — Other Ambulatory Visit: Payer: Self-pay | Admitting: Cardiology

## 2013-02-02 ENCOUNTER — Other Ambulatory Visit: Payer: Self-pay | Admitting: Emergency Medicine

## 2013-02-02 NOTE — Telephone Encounter (Signed)
Rx was sent to pharmacy electronically.

## 2013-02-03 ENCOUNTER — Telehealth: Payer: Self-pay | Admitting: Emergency Medicine

## 2013-02-03 NOTE — Telephone Encounter (Signed)
pts wife called back and stated that the pt got up this morning and started to walk around and had this severe leg cramp in the left leg that went up into his lower back.  pts wife stated that the pt was not able to walk with this cramp and has been going on for about 2 hours.  She stated that he was having muscle spasms and this has eased up some for now.  Pt used tylenol and heat and cold to the area.  Pt was started on amarly on Tuesday for DM.  Please advise if any other recs can be given.  RB please advise. Thanks  No Known Allergies

## 2013-02-03 NOTE — Telephone Encounter (Signed)
lmomtcb to discuss with pt.  

## 2013-02-06 ENCOUNTER — Telehealth: Payer: Self-pay | Admitting: Emergency Medicine

## 2013-02-06 DIAGNOSIS — R0989 Other specified symptoms and signs involving the circulatory and respiratory systems: Secondary | ICD-10-CM

## 2013-02-06 DIAGNOSIS — R0609 Other forms of dyspnea: Secondary | ICD-10-CM

## 2013-02-06 NOTE — Telephone Encounter (Signed)
lmomtcb x1 

## 2013-02-07 NOTE — Telephone Encounter (Signed)
Not clear to me whether the amaryl is related. Please call him and ask how he is doing. Thanks.

## 2013-02-07 NOTE — Telephone Encounter (Signed)
I spoke with pt. He reports it just happened that one day and has not happened since. I advised will forward to RB as an [place]

## 2013-02-07 NOTE — Telephone Encounter (Signed)
Amber returned call and can be reached @ the same #. Dion Saucier

## 2013-02-07 NOTE — Telephone Encounter (Signed)
LMOM TCB x2 for [employer]

## 2013-02-07 NOTE — Telephone Encounter (Signed)
Spoke with Museum/gallery conservator, nurse with [facility] She states that the pt's wt tends to fluctuate a lot, and so she wanted to know if RB would like to give parameters for when lasix should be increased  Please advise, thanks!

## 2013-02-08 NOTE — Telephone Encounter (Signed)
Thanks

## 2013-02-09 NOTE — Telephone Encounter (Signed)
lmomtcb for [employer]

## 2013-02-09 NOTE — Telephone Encounter (Signed)
I would like to know his dry wt. I can discuss w cardiology about changing his diuretics.

## 2013-02-10 NOTE — Telephone Encounter (Signed)
Per RB dry weight is the pt weight first thing in the morning, no clothing, before eating anything, after using restroom. I have LMTCBx1. [place] Bing, CMA

## 2013-02-10 NOTE — Telephone Encounter (Signed)
Spoke with [place] is going to work on getting patients dry weight by Monday morning and will contact our office back with the value.

## 2013-02-10 NOTE — Telephone Encounter (Signed)
Amber returning call can be reached at [phone].Elnita Maxwell

## 2013-02-10 NOTE — Telephone Encounter (Signed)
LMOM x 2 -- for [employer]

## 2013-02-13 ENCOUNTER — Telehealth: Payer: Self-pay | Admitting: Emergency Medicine

## 2013-02-13 ENCOUNTER — Ambulatory Visit: Payer: BC Managed Care – PPO | Admitting: Emergency Medicine

## 2013-02-13 MED ORDER — METOLAZONE 5 MG PO TABS: 5.0000 mg | ORAL_TABLET | ORAL | Status: DC

## 2013-02-13 NOTE — Telephone Encounter (Signed)
Please have him increase zaroxolyn to 75m on Monday and Friday He should take lasix 834mbid as long as wt is 185 or below. If wt goes above 185 then he should take 12055mn the am, 41m1m the pm.  He needs to come to have BMP drawn in 7-10 days.

## 2013-02-13 NOTE — Telephone Encounter (Signed)
Advised pt FMLA papers signed and sent down to medical records.  Pt verbalized understanding and has no further questions

## 2013-02-13 NOTE — Telephone Encounter (Signed)
Amber returned call & states that pt weight 190 today (pt has gained 2 pounds in 1 day).  Pt has gotten as low as 181 & is comfortable at 185.  States that she measured pt's abdomin & ankles which have no increase.  Amber can be reached at [phone].  Satira Anis

## 2013-02-13 NOTE — Telephone Encounter (Signed)
i am filling out the paperwork, came to [place] on 02/09/13

## 2013-02-13 NOTE — Telephone Encounter (Signed)
Pt advised of recs. Order placed for BMP. I have LMTCBx1 with Amber, gentiva nurse to advise her of recs as well. [place] Bing, CMA

## 2013-02-13 NOTE — Telephone Encounter (Signed)
Dr. Lamonte Sakai has the paperwork but it is dated 02-09-13, no 2 weeks. So I spoke with the pt and and advised RB has not been here but he stated he will complete the forms today. I will forward this message to [facility] as a reminder to have RB complete the forms today before he leaves. [place] Bing, CMA

## 2013-02-15 NOTE — Telephone Encounter (Signed)
LMTCBx2 with Amber to advise of recs. Pt is aware. [place] Bing, CMA

## 2013-02-16 ENCOUNTER — Ambulatory Visit (INDEPENDENT_AMBULATORY_CARE_PROVIDER_SITE_OTHER): Payer: BC Managed Care – PPO | Admitting: Emergency Medicine

## 2013-02-16 ENCOUNTER — Encounter: Payer: Self-pay | Admitting: Emergency Medicine

## 2013-02-16 VITALS — BP 130/82 | HR 89 | Ht 68.0 in | Wt 191.8 lb

## 2013-02-16 DIAGNOSIS — J8410 Pulmonary fibrosis, unspecified: Secondary | ICD-10-CM

## 2013-02-16 DIAGNOSIS — I509 Heart failure, unspecified: Secondary | ICD-10-CM

## 2013-02-16 DIAGNOSIS — R0609 Other forms of dyspnea: Secondary | ICD-10-CM

## 2013-02-16 DIAGNOSIS — I272 Pulmonary hypertension, unspecified: Secondary | ICD-10-CM

## 2013-02-16 DIAGNOSIS — I5043 Acute on chronic combined systolic (congestive) and diastolic (congestive) heart failure: Secondary | ICD-10-CM

## 2013-02-16 DIAGNOSIS — I2789 Other specified pulmonary heart diseases: Secondary | ICD-10-CM

## 2013-02-16 NOTE — Assessment & Plan Note (Signed)
-  we adjusted lasix > he will take 120 am and 80pm on days when he is > 185lbs, otherwise take 80/80. He takes zaroxolyn on Mon and Friday > increased from 2.5 to 5.35m - check BMP in 7-10 days.

## 2013-02-16 NOTE — Patient Instructions (Addendum)
Please continue your lasix and zaroxylyn as below:  - lasix 1m twice a day if wt is 185 or less - lasix 1270min am and 808mn pm if wt is > 185 - zaroxolyn 5mg43m Monday and Friday Continue your other medications as ordered We will check your blood work in 7 -10 days since we have changed your medications We will work on getting a wheelchair Follow with Dr ByruLamonte Sakai3 months or sooner if you have any problems.

## 2013-02-16 NOTE — Progress Notes (Signed)
Subjective:    Patient ID: [name], male    DOB: 16-Mar-1951, 62 y.o.   MRN: [id number] HPI 63 yo man, former smoker with COPD, hx psoriatic arthritis and associated ILD, profound hypoxemia and associated Pulm HTN (PAP 57/26 mean 37, PAOP 14/11 mean 7 by cath 10/04/09. 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 Admitted 6/21-27 with resp failure and decompensated R heart failure. He was diuresed, treated with corticosteroids and started on bronchodilators for his COPD. O2 was titrated to 5L/min at rest, he is leaving it on 5 with exertion.   ROV 02/05/10 -- 62yo, PAH with Hx psoriatic arthritis, ILD and R heart failure, hypoxemia, on chronic pred 88m once daily. Also hx COPD.  On letaris since September '11. Since last time breathing may be a bit better. He has good days and bad days. Rarely uses ventolin. No HA, no syncope or near syncope. LE edema has been well controlled and stable. Was desaturated while walking today on presentation. Has been gaining some wt, aldactone was decreased by Dr SElisabeth Caraa month ago. PFTs done today.   ROV 04/16/10 -- Hx as above, follows up after TTE and 6 min walk. Returns feeling a bit more SOB w exertion. 6 min walk distance = 16[place] with significant desat to 75% (after 2 min). Estimated RVSP 7972m(up from 59 in 09/2009). Has been on letaris since September. Edema seems better on lasix 56m75mo times a day. Clearly both TTE and walk distance are worse.   ROV 05/19/10 -- returns for f/u of the above issues. last time we started Tyvaso in addition to his letaris. Has been on it for a month. Up to 9 puffs 4x a day. Has been assoc with some HA, occas cough right after doing it, not intrusive. On Pred 10mg70miriva + ventoilin as needed. He is not noticing any big changes in his breathing yet on the Tyvaso. Monthly LFT's normal 1/17.   ROV 07/29/10 -- COPD, psoriatic arthritis + ILD, PAH. Regimen is Tyvaso + Letaris. [facility] They have told him he needs screening CSY, EtOH and tobacco random screening, counseling. Tells me today that his breathing is stable, although he has had trouble w allergies and w the pollen. Taking spiriva + SABA. O2 is set on 6L/min via oximizer.   ROV 09/23/10 -- COPD, psoriatic arthritis + ILD, PAH. He is feeling well, has been doing pulm rehab. Has been maintained on Tyvaso, Letaris. [facility] >> cont on current regimen  POST HOSP 10/10/10  Pt returns for post hospital follow up  Pt was admitted   6/11-6/15 for acute on chronic resp failure w/ underlying PF/RA/PAH.  CT chest was neg for PE, chronic changes along with diffuse ASPDZ -?alveolitis vs pulm edema. BNP >2000 . At discharge BNP 548.  He did receive increased steroids and diuresis along pulmonary hygiene.   Since discharge he is better but still remains weak and wears out easily.  Ankles are still puffy esp in the evening. He says he is off his spironolactone but unclear why-no mention in discharge summary why this was held   Today BMET shows K+ 3.7    ROV 10/24/10 -- COPD, hx psoriatic arthritis and associated ILD, profound hypoxemia and associated Pulm HTN (PAP 57/26 mean 37, PAOP 14/11 mean 7 by cath 10/04/09. 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 Admitted  6/11 - 15, still quite limited, not back to full speed.  Biggest complaint is exertional dyspnea and hypoxemia - to 60's on oximizer 6L/min. He continues to have exertional hypoxemia.   ROV 01/20/11 -- COPD, hx psoriatic arthritis (Dr Tonia Brooms) and associated ILD, profound hypoxemia and associated Pulm HTN (PAP 57/26 mean 37, PAOP 14/11 mean 7 by cath 10/04/09). 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 We had him evaluated for possible transplant, turned down at [facility]. Using 5-6L/min via oximizer at rest, up to 8-10L/min when moving. Using Spiriva and albuterol prn. Continues to have exertional SOB, may be worse. He is coughing more,  has nasal congestion last few days. Taking zyrtec. He is using some saline. Has humidity in his O2 concentrator. Still in the [facility] pulm rehab maintenance program.    ROV 05/18/11 -- COPD, hx psoriatic arthritis (Dr Tonia Brooms) and associated ILD, profound hypoxemia and associated Pulm HTN (PAP 57/26 mean 37, PAOP 14/11 mean 7 by cath 10/04/09). 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 Cough well controlled. He is on Letaris + Tyvaso. He had R heart cath at [facility] in Spring 2012, I don't have data. Needs 6 minute walk.   ROV 08/20/11 -- COPD, hx psoriatic arthritis (Dr Tonia Brooms) and associated ILD, profound hypoxemia and associated Pulm HTN (PAP 57/26 mean 37, PAOP 14/11 mean 7 by cath 10/04/09). 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  6 min walk distance 146mtoday. 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 Couldn't walk as far today, feels more dyspneic overall. Using 6L/min per oximizer. [facility]arel[facility]. On Spiriva + albuterol. Pt wants me to stop his aldactone and increase lasix, doesn't like the aldactone because of gynecomastia.   ROV 10/05/11 -- COPD, hx psoriatic arthritis (Dr GTonia Brooms and associated ILD, profound hypoxemia and associated Pulm HTN (PAP 57/26 mean 37, PAOP 14/11 mean 7 by cath 10/04/09). 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 Has spoken with CAldrichabout increasing his Tyvaso to 12 puffs qid. I did not do this because I don't know of any studies to support. Still occasionally does the rehab maintenance program. LFT done today.   ROV 11/25/11 -- COPD, hx psoriatic arthritis (Dr GTonia Brooms and associated ILD, profound hypoxemia and associated Pulm HTN (PAP 57/26 mean 37, PAOP 14/11 mean 7 by cath 10/04/09). 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 Tyvaso increased to 12 puffs 4x a day in 6/13 but not sure if it has helped, remains on xarelto and letaris 5. After wt gain of 10 lbs and more SOB, lasix  temporarily increase to 856mbid, then back down to 4026mid. He felt that his breathing was better when he was < 190 lbs, currently at 194lbs.   ROV 12/25/11 -- COPD, hx psoriatic arthritis (Dr GruTonia Broomsnd associated ILD, profound hypoxemia and associated Pulm HTN. On Letaris + Tyvaso. He remains hypoxic with exertion, even on oximizer.  Last time we continued 12 puffs tyvaso. He feels stable, able to exert some. Since last time [facility] in coming months. Wants the flu shot.   ROV 03/11/12 -- COPD, hx psoriatic arthritis (Dr GruTonia Broomsnd associated ILD, profound hypoxemia and associated Pulm HTN. On Letaris + Tyvaso 12 puffs. He remains hypoxic with exertion, even  on oximizer. Due for 6 minute walk in March. He has been having trouble with nose dryness, bleeding, due to high flow O2. He has tried nasal saline, saline gel, etc. Still very problematic.  His breathing has been fairly stable.   ROV 05/17/12 -- COPD, hx psoriatic arthritis (Dr Tonia Brooms) and associated ILD, profound hypoxemia and associated Pulm HTN. On Letaris + Tyvaso 12 puffs. Has been very difficult to keep him oxygenated, he is having progressive dyspnea. Has been seen at [facility] for transplant but not [place]. He has more SOB, now unable to walk up the stairs - has started sleeping downstairs/. He uses albuterol tid (an increase) since last month.   ROV 06/22/12 -- COPD, hx psoriatic arthritis (Dr Tonia Brooms) and associated ILD, profound hypoxemia and associated Pulm HTN. On Letaris + Tyvaso 12 puffs.  He is profoundly hypoxemic and I suspect he needs IV therapy. He is still having problems with epistaxis, has seen ENT - due to his high flow O2. Last time we increased pred to 72m empirically - he may be recovering more quickly after exertion. Last TTE PASP was 50-60, 12/23/11.   [facility]  [facility]9 for acute resp failure w/ bronchitis, superimposed on ILD associated w/ PAH w/  decompensated cor pulmonale .  Tx w/ abx, steroids , diuretics.  Since discharge he is feeling some better. But still weak no energy.  Some ankle edema worse in evening.  No hemoptysis, chest pain , fever or n/v.  Letaris was increased during admission , needs rx sent to pharmacy.  Currenlty on tapering dose of steorids, baseline is 18mdaily   08/08/12 -- chronic resp failure and severe PAH due to ILD, obstructive disease. He was admitted recently w decompensation and hypoxemia, ? After a URI. We increased letaris to 1081motherwise no real changes in therapy. Steroids tapering to 62m86med daily.  He reports more LE and foot edema. For the last week he has increased his lasix to 80mg92m.   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 He has severe chronic hypoxemia. He is on lasix 80 bid, tyvaso 12 puffs, letaris 10, pred 20, humira. We tried going down to pred 10, but his breathing worsened so we went back to 20 - he improved. His edema is better on the increased lasix.   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 He has severe chronic hypoxemia. We have adjusted his diuretics recently given wt gain, more dyspnea. Taking zaroxolyn, planning to increase to 5mg o42mon and Fri. Oxygenation is marginal on high flow O2. Remains on pred 20.  He is wondering about using salt substitute - KCl. He also needs a wheelchair.   Labs from 02/15/13 >> S Cr 1.1, K 3.7, Na 137   Objective:   Physical Exam Filed Vitals:   02/16/13 1432  BP: 130/82  Pulse: 89  Height: _0  (1.727 m)  Weight: 191 lb 12.8 oz (87 kg)  SpO2: 90%   Gen: Pleasant, hyperpigmented face, no distress  ENT: No lesions,  mouth clear,  oropharynx clear, no postnasal drip, throat clearing. Nasal mucosa red and swollen bilaterally  Neck: No JVD, no TMG, no carotid bruits  Lungs: No  use of accessory muscles, Bibasilar insp crackles. No wheeze  Cardiovascular: RRR, heart sounds normal, no murmur or gallops, trace pretibial edema  Musculoskeletal: No deformities,  no cyanosis or clubbing  Neuro: alert, non focal  Skin: Warm, no lesions or rashes, hyperpigmented face psoriatic patches along arms and legs    Assessment & Plan:  PULMONARY FIBROSIS pred 20  Acute on chronic combined systolic and diastolic congestive heart failure -we adjusted lasix > he will take 120 am and 80pm on days when he is > 185lbs, otherwise take 80/80. He takes zaroxolyn on Mon and Friday > increased from 2.5 to 5.70m - check BMP in 7-10 days.   Pulmonary hypertension - continue current regimen > tyvaso, letaris - wants a wheelchair and possibly an electric wheelchair.

## 2013-02-16 NOTE — Telephone Encounter (Signed)
LMTCBx3 for Amber to give recs. Pt is aware of recs but was trying to advise Amber as well. [place] Bing, CMA

## 2013-02-16 NOTE — Assessment & Plan Note (Signed)
-  continue current regimen > tyvaso, letaris - wants a wheelchair and possibly an electric wheelchair.

## 2013-02-16 NOTE — Assessment & Plan Note (Signed)
pred 20

## 2013-02-17 NOTE — Telephone Encounter (Signed)
Amber returned call and can be reached @ [phone]. Dion Saucier

## 2013-02-17 NOTE — Telephone Encounter (Signed)
LMTCBx4 on [id number] and [id number]. [place] Bing, CMA

## 2013-02-17 NOTE — Telephone Encounter (Signed)
Spoke with Museum/gallery conservator with [place] of recs per RB  Collene Gobble, MD Physician Signed Service date: 02/13/2013 4:18 PM   Please have him increase zaroxolyn to 42m on Monday and Friday  He should take lasix 866mbid as long as wt is 185 or below. If wt goes above 185 then he should take 12055mn the am, 92m81m the pm.  He needs to come to have BMP drawn in 7-10 days.    Amber states that as far as the labs go, she is going to draw the BMP herself between 11/7-11/10 rather than trying to get the patient here to our lab. Aware to Fax results to our office ASAP ATTN: DR BYRUM "URGENT"  Will send msg to Meghan to keep eye out for these results around 11/7-11/10 and follow up on--will need to be sent to RB once received. Thanks.

## 2013-02-23 NOTE — Telephone Encounter (Signed)
[facility] for [employer] with [employer].  Ok to give verbal order for PT

## 2013-02-23 NOTE — Telephone Encounter (Addendum)
[name] with [employer] calling for verbal order for PT 2 x week for 4 weeks.  [phone]

## 2013-02-23 NOTE — Telephone Encounter (Signed)
I called and gave Amber VO. Will sign off message

## 2013-02-25 NOTE — Progress Notes (Signed)
agree

## 2013-03-08 ENCOUNTER — Telehealth: Payer: Self-pay | Admitting: Emergency Medicine

## 2013-03-08 NOTE — Telephone Encounter (Signed)
Noted by triage.  Will sign and forward to RB as FYI.

## 2013-03-08 NOTE — Telephone Encounter (Signed)
Thank you.

## 2013-03-20 ENCOUNTER — Telehealth: Payer: Self-pay | Admitting: Emergency Medicine

## 2013-03-20 NOTE — Telephone Encounter (Signed)
I called [name] and gave VO. Nothing further needed

## 2013-03-23 ENCOUNTER — Other Ambulatory Visit: Payer: Self-pay | Admitting: Dermatology

## 2013-04-03 ENCOUNTER — Telehealth: Payer: Self-pay | Admitting: Emergency Medicine

## 2013-04-03 MED ORDER — PREDNISONE 20 MG PO TABS: 20.0000 mg | ORAL_TABLET | Freq: Every day | ORAL | Status: DC

## 2013-04-03 NOTE — Telephone Encounter (Signed)
Pt requesting rx for pred 20 mg RB had changed dose from 10 mg to 2m. This rx has [facility] [address]  Pt aware and nothing further needed

## 2013-04-07 ENCOUNTER — Telehealth: Payer: Self-pay | Admitting: Emergency Medicine

## 2013-04-07 NOTE — Telephone Encounter (Signed)
Will forward to RB so that he is aware

## 2013-04-18 ENCOUNTER — Telehealth: Payer: Self-pay | Admitting: Emergency Medicine

## 2013-04-18 ENCOUNTER — Other Ambulatory Visit: Payer: Self-pay | Admitting: Emergency Medicine

## 2013-04-18 NOTE — Telephone Encounter (Signed)
I gave a verbal ok to refill Tyveso since Rb is out of the office. They took the verbal but the form also needs to be signed. I checked and this is in [place] look-at to sign on his return. Pharmacy is aware and will fill meds today. [place] Bing, CMA

## 2013-04-25 ENCOUNTER — Other Ambulatory Visit: Payer: Self-pay | Admitting: Emergency Medicine

## 2013-04-27 NOTE — Telephone Encounter (Signed)
I don't believe we can get him any further [facility] services without another [facility] needs assessment - please call him to see what the status of this is.

## 2013-05-04 ENCOUNTER — Other Ambulatory Visit: Payer: Self-pay | Admitting: Emergency Medicine

## 2013-05-04 NOTE — Telephone Encounter (Signed)
Per [name] paperwork [name] no longer qualifies for home health aide. [name] is aware and is no longer receiving these services.  Nothing further to be done at this time

## 2013-05-16 ENCOUNTER — Ambulatory Visit (INDEPENDENT_AMBULATORY_CARE_PROVIDER_SITE_OTHER): Payer: Medicare Other | Admitting: Emergency Medicine

## 2013-05-16 ENCOUNTER — Telehealth: Payer: Self-pay | Admitting: Emergency Medicine

## 2013-05-16 ENCOUNTER — Encounter: Payer: Self-pay | Admitting: Emergency Medicine

## 2013-05-16 VITALS — BP 130/84 | HR 79 | Ht 68.0 in | Wt 185.0 lb

## 2013-05-16 DIAGNOSIS — I272 Pulmonary hypertension, unspecified: Secondary | ICD-10-CM

## 2013-05-16 DIAGNOSIS — I2789 Other specified pulmonary heart diseases: Secondary | ICD-10-CM

## 2013-05-16 NOTE — Telephone Encounter (Signed)
I have corrected the sig on his lasix rx. Nothing further was needed. RB has been made aware of this information.

## 2013-05-16 NOTE — Progress Notes (Signed)
Subjective:    Patient ID: [name], male    DOB: 16-Mar-1951, 63 y.o.   MRN: [id number] HPI 63 yo man, former smoker with COPD, hx psoriatic arthritis and associated ILD, profound hypoxemia and associated Pulm HTN (PAP 57/26 mean 37, PAOP 14/11 mean 7 by cath 10/04/09. 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 Admitted 6/21-27 with resp failure and decompensated R heart failure. He was diuresed, treated with corticosteroids and started on bronchodilators for his COPD. O2 was titrated to 5L/min at rest, he is leaving it on 5 with exertion.   ROV 02/05/10 -- 63yo, PAH with Hx psoriatic arthritis, ILD and R heart failure, hypoxemia, on chronic pred 88m once daily. Also hx COPD.  On letaris since September '11. Since last time breathing may be a bit better. He has good days and bad days. Rarely uses ventolin. No HA, no syncope or near syncope. LE edema has been well controlled and stable. Was desaturated while walking today on presentation. Has been gaining some wt, aldactone was decreased by Dr SElisabeth Caraa month ago. PFTs done today.   ROV 04/16/10 -- Hx as above, follows up after TTE and 6 min walk. Returns feeling a bit more SOB w exertion. 6 min walk distance = 1639[place] with significant desat to 75% (after 2 min). Estimated RVSP 7972m(up from 59 in 09/2009). Has been on letaris since September. Edema seems better on lasix 56m75mo times a day. Clearly both TTE and walk distance are worse.   ROV 05/19/10 -- returns for f/u of the above issues. last time we started Tyvaso in addition to his letaris. Has been on it for a month. Up to 9 puffs 4x a day. Has been assoc with some HA, occas cough right after doing it, not intrusive. On Pred 10mg70miriva + ventoilin as needed. He is not noticing any big changes in his breathing yet on the Tyvaso. Monthly LFT's normal 1/17.   ROV 07/29/10 -- COPD, psoriatic arthritis + ILD, PAH. Regimen is Tyvaso + Letaris. [facility] They have told him he needs screening CSY, EtOH and tobacco random screening, counseling. Tells me today that his breathing is stable, although he has had trouble w allergies and w the pollen. Taking spiriva + SABA. O2 is set on 6L/min via oximizer.   ROV 09/23/10 -- COPD, psoriatic arthritis + ILD, PAH. He is feeling well, has been doing pulm rehab. Has been maintained on Tyvaso, Letaris. [facility] >> cont on current regimen  POST HOSP 10/10/10  Pt returns for post hospital follow up  Pt was admitted   6/11-6/15 for acute on chronic resp failure w/ underlying PF/RA/PAH.  CT chest was neg for PE, chronic changes along with diffuse ASPDZ -?alveolitis vs pulm edema. BNP >2000 . At discharge BNP 548.  He did receive increased steroids and diuresis along pulmonary hygiene.   Since discharge he is better but still remains weak and wears out easily.  Ankles are still puffy esp in the evening. He says he is off his spironolactone but unclear why-no mention in discharge summary why this was held   Today BMET shows K+ 3.7    ROV 10/24/10 -- COPD, hx psoriatic arthritis and associated ILD, profound hypoxemia and associated Pulm HTN (PAP 57/26 mean 37, PAOP 14/11 mean 7 by cath 10/04/09. 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 Admitted  6/11 - 15, still quite limited, not back to full speed.  Biggest complaint is exertional dyspnea and hypoxemia - to 60's on oximizer 6L/min. He continues to have exertional hypoxemia.   ROV 01/20/11 -- COPD, hx psoriatic arthritis (Dr Tonia Brooms) and associated ILD, profound hypoxemia and associated Pulm HTN (PAP 57/26 mean 37, PAOP 14/11 mean 7 by cath 10/04/09). 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 We had him evaluated for possible transplant, turned down at [facility]. Using 5-6L/min via oximizer at rest, up to 8-10L/min when moving. Using Spiriva and albuterol prn. Continues to have exertional SOB, may be worse. He is coughing more,  has nasal congestion last few days. Taking zyrtec. He is using some saline. Has humidity in his O2 concentrator. Still in the [facility] pulm rehab maintenance program.    ROV 05/18/11 -- COPD, hx psoriatic arthritis (Dr Tonia Brooms) and associated ILD, profound hypoxemia and associated Pulm HTN (PAP 57/26 mean 37, PAOP 14/11 mean 7 by cath 10/04/09). 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 Cough well controlled. He is on Letaris + Tyvaso. He had R heart cath at [facility] in Spring 2012, I don't have data. Needs 6 minute walk.   ROV 08/20/11 -- COPD, hx psoriatic arthritis (Dr Tonia Brooms) and associated ILD, profound hypoxemia and associated Pulm HTN (PAP 57/26 mean 37, PAOP 14/11 mean 7 by cath 10/04/09). 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  6 min walk distance 159mtoday. 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 Couldn't walk as far today, feels more dyspneic overall. Using 6L/min per oximizer. [facility] + xarelto. On Spiriva + albuterol. Pt wants me to stop his aldactone and increase lasix, doesn't like the aldactone because of gynecomastia.   ROV 10/05/11 -- COPD, hx psoriatic arthritis (Dr GTonia Brooms and associated ILD, profound hypoxemia and associated Pulm HTN (PAP 57/26 mean 37, PAOP 14/11 mean 7 by cath 10/04/09). 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 [facility] to 12 puffs qid. I did not do this because I don't know of any studies to support. Still occasionally does the rehab maintenance program. LFT done today.   ROV 11/25/11 -- COPD, hx psoriatic arthritis (Dr GTonia Brooms and associated ILD, profound hypoxemia and associated Pulm HTN (PAP 57/26 mean 37, PAOP 14/11 mean 7 by cath 10/04/09). 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 Tyvaso increased to 12 puffs 4x a day in 6/13 but not sure if it has helped, remains on xarelto and letaris 5. After wt gain of 10 lbs and more SOB, lasix  temporarily increase to 868mbid, then back down to 4065mid. He felt that his breathing was better when he was < 190 lbs, currently at 194lbs.   ROV 12/25/11 -- COPD, hx psoriatic arthritis (Dr GruTonia Broomsnd associated ILD, profound hypoxemia and associated Pulm HTN. On Letaris + Tyvaso. He remains hypoxic with exertion, even on oximizer.  Last time we continued 12 puffs tyvaso. He feels stable, able to exert some. Since last time [facility] in coming months. Wants the flu shot.   ROV 03/11/12 -- COPD, hx psoriatic arthritis (Dr GruTonia Broomsnd associated ILD, profound hypoxemia and associated Pulm HTN. On Letaris + Tyvaso 12 puffs. He remains hypoxic with exertion, even  on oximizer. Due for 6 minute walk in March. He has been having trouble with nose dryness, bleeding, due to high flow O2. He has tried nasal saline, saline gel, etc. Still very problematic.  His breathing has been fairly stable.   ROV 05/17/12 -- COPD, hx psoriatic arthritis (Dr Tonia Brooms) and associated ILD, profound hypoxemia and associated Pulm HTN. On Letaris + Tyvaso 12 puffs. Has been very difficult to keep him oxygenated, he is having progressive dyspnea. Has been seen at [facility] for transplant but not for [place]. He has more SOB, now unable to walk up the stairs - has started sleeping downstairs/. He uses albuterol tid (an increase) since last month.   ROV 06/22/12 -- COPD, hx psoriatic arthritis (Dr Tonia Brooms) and associated ILD, profound hypoxemia and associated Pulm HTN. On Letaris + Tyvaso 12 puffs.  He is profoundly hypoxemic and I suspect he needs IV therapy. He is still having problems with epistaxis, has seen ENT - due to his high flow O2. Last time we increased pred to 53m empirically - he may be recovering more quickly after exertion. Last TTE PASP was 50-60, 12/23/11.   [facility]  [facility]/31-4/9 for acute resp failure w/ bronchitis, superimposed on ILD associated w/ PAH w/  decompensated cor pulmonale .  Tx w/ abx, steroids , diuretics.  Since discharge he is feeling some better. But still weak no energy.  Some ankle edema worse in evening.  No hemoptysis, chest pain , fever or n/v.  Letaris was increased during admission , needs rx sent to pharmacy.  Currenlty on tapering dose of steorids, baseline is 144mdaily   08/08/12 -- chronic resp failure and severe PAH due to ILD, obstructive disease. He was admitted recently w decompensation and hypoxemia, ? After a URI. We increased letaris to 1025motherwise no real changes in therapy. Steroids tapering to 57m47med daily.  He reports more LE and foot edema. For the last week he has increased his lasix to 80mg8m.   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 He has severe chronic hypoxemia. He is on lasix 80 bid, tyvaso 12 puffs, letaris 10, pred 20, humira. We tried going down to pred 10, but his breathing worsened so we went back to 20 - he improved. His edema is better on the increased lasix.   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 He has severe chronic hypoxemia. We have adjusted his diuretics recently given wt gain, more dyspnea. Taking zaroxolyn, planning to increase to 5mg o84mon and Fri. Oxygenation is marginal on high flow O2. Remains on pred 20.  He is wondering about using salt substitute - KCl. He also needs a wheelchair.   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 He has severe chronic hypoxemia. He is on lasix but unsure of the dosing, tyvaso 12 puffs, letaris 10, pred 20, humira. He uses metolazone usually Mon and Fri, adjusts lasix based on wt.    Objective:   Physical Exam Filed Vitals:   05/16/13 1409  BP: 130/84  Pulse: 79   Height: _0  (1.727 m)  Weight: 185 lb (83.915 kg)  SpO2: 92%   Gen: Pleasant, hyperpigmented face, no distress  ENT: No lesions,  mouth clear,  oropharynx clear, no postnasal drip, throat clearing. Nasal mucosa red and swollen bilaterally  Neck: No JVD, no TMG, no carotid bruits  Lungs: No use of accessory muscles, Bibasilar insp crackles. No wheeze  Cardiovascular: RRR, heart sounds normal, no murmur or gallops, trace pretibial edema  Musculoskeletal: No deformities, no cyanosis or clubbing  Neuro: alert, non focal  Skin: Warm, no lesions or rashes, hyperpigmented face psoriatic patches along arms and legs    Assessment & Plan:  Pulmonary hypertension Please look at your lasix at home and call us to confirm the dose that you are currently taking.  Continue the metolazone Monday and Friday.  Continue your other medications as you have been taking them We can work to extend the period of time you have home health nursing and aide if needed. We will need to discuss with your insurance.  Follow with Dr Lamonte Sakai in 2 months or sooner if you have any problems

## 2013-05-16 NOTE — Patient Instructions (Signed)
Please look at your lasix at home and call us to confirm the dose that you are currently taking.  Continue the metolazone Monday and Friday.  Continue your other medications as you have been taking them We can work to extend the period of time you have home health nursing and aide if needed. We will need to discuss with your insurance.  Follow with Dr Lamonte Sakai in 2 months or sooner if you have any problems.

## 2013-05-16 NOTE — Assessment & Plan Note (Signed)
Please look at your lasix at home and call us to confirm the dose that you are currently taking.  Continue the metolazone Monday and Friday.  Continue your other medications as you have been taking them We can work to extend the period of time you have home health nursing and aide if needed. We will need to discuss with your insurance.  Follow with Dr Don Giarrusso in 2 months or sooner if you have any problems.  

## 2013-05-30 ENCOUNTER — Telehealth: Payer: Self-pay | Admitting: Emergency Medicine

## 2013-05-30 NOTE — Telephone Encounter (Signed)
RB has filled out PA for Tyvaso and Letairis and I have faxed these forms back to [facility] [phone] Nothing further is needed awaiting response on approval of these medications and will contact pt when this is recieved

## 2013-05-30 NOTE — Telephone Encounter (Signed)
RB is working on this PA for [employer] and I will fax back when he completes these forms to [place]. Nothing further is needed at this time

## 2013-05-30 NOTE — Telephone Encounter (Signed)
Called the number above and spoke with [facility] rep Samantha.  Per Aldona Bar, she faxed a PA form for pt's Tyvaso and Letairis to the main fax [phone]).  Did attempt to do PA over the phone, but questions were too complex (ie, asked for the [facility] Classification of pt's Pulmonary Hypertension).  Will forward message to [facility] to follow up on pending fax

## 2013-06-05 NOTE — Telephone Encounter (Addendum)
Per [facility] Tyvaso has been approved and is good as long as he is covered by this plan Nothing further is needed. In reference to [facility] stated these forms were incomplete and needed to be completed Will have RB finish these forms today and fax back in to [facility]

## 2013-06-16 ENCOUNTER — Telehealth: Payer: Self-pay | Admitting: Emergency Medicine

## 2013-06-16 ENCOUNTER — Other Ambulatory Visit: Payer: Self-pay | Admitting: Emergency Medicine

## 2013-06-16 ENCOUNTER — Other Ambulatory Visit: Payer: Self-pay | Admitting: Cardiology

## 2013-06-16 NOTE — Telephone Encounter (Signed)
Please advise Meghan thanks

## 2013-06-16 NOTE — Telephone Encounter (Signed)
Duplicate message. See prior phone note

## 2013-06-16 NOTE — Telephone Encounter (Signed)
Per [facility] [name] has been denied and RB aware and will call doc line to appeal on 3//3/15. Will notify pt and follow up on next week

## 2013-06-17 NOTE — Telephone Encounter (Signed)
Rx was sent to pharmacy electronically.

## 2013-06-21 NOTE — Telephone Encounter (Signed)
I need to start the appeal

## 2013-06-21 NOTE — Telephone Encounter (Signed)
Dr. Lamonte Sakai has appeal been initiated? thanks

## 2013-06-21 NOTE — Telephone Encounter (Signed)
Priyanka w/ [facility] would like to ensure that we did receive the denail for pt's PA & would like to know if the appeal has been started or not.  Can be reached at [phone].  Satira Anis

## 2013-06-22 NOTE — Telephone Encounter (Signed)
Advised pt that RB is working on PA for [employer] and as soon as I hear something on decision I will contact him. Nothing further needed at this time, awaiting decision

## 2013-06-26 ENCOUNTER — Encounter: Payer: Self-pay | Admitting: Emergency Medicine

## 2013-06-26 NOTE — Telephone Encounter (Signed)
Meghan, I typed a letter to accompany Mr Mcvey's chart notes, R heart cath data, etc. for the appeal. We will need to fax it all tomorrow 3/10. Thanks

## 2013-06-28 NOTE — Telephone Encounter (Signed)
Have faxed all documentation needed to urgent appeals.  Awaiting 72 hour determination.

## 2013-07-04 NOTE — Telephone Encounter (Signed)
Advised pt that we have received the PA for Letaris and this is good through 07/02/15. Nothing further needed.

## 2013-07-04 NOTE — Telephone Encounter (Signed)
Please advise if anything has been received? thanks

## 2013-07-17 ENCOUNTER — Encounter: Payer: Self-pay | Admitting: Emergency Medicine

## 2013-07-17 ENCOUNTER — Ambulatory Visit (INDEPENDENT_AMBULATORY_CARE_PROVIDER_SITE_OTHER): Payer: BC Managed Care – PPO | Admitting: Emergency Medicine

## 2013-07-17 VITALS — BP 128/74 | HR 79 | Ht 68.0 in | Wt 187.0 lb

## 2013-07-17 DIAGNOSIS — I2789 Other specified pulmonary heart diseases: Secondary | ICD-10-CM

## 2013-07-17 DIAGNOSIS — I272 Pulmonary hypertension, unspecified: Secondary | ICD-10-CM

## 2013-07-17 NOTE — Assessment & Plan Note (Signed)
Extremely complicated case with multifactorial PAH. I don;t believe there is anything else we can do with the medications he is on. Will need to consider IV therapy although he may not benefit much at this point.   Please continue your current medications as you are taking them Please call our office if you would like [place] to refer you to consider IV therapy for pulmonary hypertension .  Follow with Dr Lamonte Sakai in 3 months or sooner if you have any problems.

## 2013-07-17 NOTE — Patient Instructions (Signed)
Please continue your current medications as you are taking them Please call our office if you would like [place] to refer you to consider IV therapy for pulmonary hypertension .  Follow with Dr Lamonte Sakai in 3 months or sooner if you have any problems.

## 2013-07-17 NOTE — Progress Notes (Signed)
Subjective:    Patient ID: [name], male    DOB: 16-Mar-1951, 63 y.o.   MRN: [id number] HPI 63 yo man, former smoker with COPD, hx psoriatic arthritis and associated ILD, profound hypoxemia and associated Pulm HTN (PAP 57/26 mean 37, PAOP 14/11 mean 7 by cath 10/04/09. 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 Admitted 6/21-27 with resp failure and decompensated R heart failure. He was diuresed, treated with corticosteroids and started on bronchodilators for his COPD. O2 was titrated to 5L/min at rest, he is leaving it on 5 with exertion.   ROV 02/05/10 -- 63yo, PAH with Hx psoriatic arthritis, ILD and R heart failure, hypoxemia, on chronic pred 88m once daily. Also hx COPD.  On letaris since September '11. Since last time breathing may be a bit better. He has good days and bad days. Rarely uses ventolin. No HA, no syncope or near syncope. LE edema has been well controlled and stable. Was desaturated while walking today on presentation. Has been gaining some wt, aldactone was decreased by Dr SElisabeth Caraa month ago. PFTs done today.   ROV 04/16/10 -- Hx as above, follows up after TTE and 6 min walk. Returns feeling a bit more SOB w exertion. 6 min walk distance = 1639[place] with significant desat to 75% (after 2 min). Estimated RVSP 7972m(up from 59 in 09/2009). Has been on letaris since September. Edema seems better on lasix 56m75mo times a day. Clearly both TTE and walk distance are worse.   ROV 05/19/10 -- returns for f/u of the above issues. last time we started Tyvaso in addition to his letaris. Has been on it for a month. Up to 9 puffs 4x a day. Has been assoc with some HA, occas cough right after doing it, not intrusive. On Pred 10mg70miriva + ventoilin as needed. He is not noticing any big changes in his breathing yet on the Tyvaso. Monthly LFT's normal 1/17.   ROV 07/29/10 -- COPD, psoriatic arthritis + ILD, PAH. Regimen is Tyvaso + Letaris. [facility] They have told him he needs screening CSY, EtOH and tobacco random screening, counseling. Tells me today that his breathing is stable, although he has had trouble w allergies and w the pollen. Taking spiriva + SABA. O2 is set on 6L/min via oximizer.   ROV 09/23/10 -- COPD, psoriatic arthritis + ILD, PAH. He is feeling well, has been doing pulm rehab. Has been maintained on Tyvaso, Letaris. [facility] >> cont on current regimen  POST HOSP 10/10/10  Pt returns for post hospital follow up  Pt was admitted   6/11-6/15 for acute on chronic resp failure w/ underlying PF/RA/PAH.  CT chest was neg for PE, chronic changes along with diffuse ASPDZ -?alveolitis vs pulm edema. BNP >2000 . At discharge BNP 548.  He did receive increased steroids and diuresis along pulmonary hygiene.   Since discharge he is better but still remains weak and wears out easily.  Ankles are still puffy esp in the evening. He says he is off his spironolactone but unclear why-no mention in discharge summary why this was held   Today BMET shows K+ 3.7    ROV 10/24/10 -- COPD, hx psoriatic arthritis and associated ILD, profound hypoxemia and associated Pulm HTN (PAP 57/26 mean 37, PAOP 14/11 mean 7 by cath 10/04/09. 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 Admitted  6/11 - 15, still quite limited, not back to full speed.  Biggest complaint is exertional dyspnea and hypoxemia - to 60's on oximizer 6L/min. He continues to have exertional hypoxemia.   ROV 01/20/11 -- COPD, hx psoriatic arthritis (Dr Tonia Brooms) and associated ILD, profound hypoxemia and associated Pulm HTN (PAP 57/26 mean 37, PAOP 14/11 mean 7 by cath 10/04/09). 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 We had him evaluated for possible transplant, turned down at [facility]. Using 5-6L/min via oximizer at rest, up to 8-10L/min when moving. Using Spiriva and albuterol prn. Continues to have exertional SOB, may be worse. He is coughing more,  has nasal congestion last few days. Taking zyrtec. He is using some saline. Has humidity in his O2 concentrator. Still in the [facility] pulm rehab maintenance program.    ROV 05/18/11 -- COPD, hx psoriatic arthritis (Dr Tonia Brooms) and associated ILD, profound hypoxemia and associated Pulm HTN (PAP 57/26 mean 37, PAOP 14/11 mean 7 by cath 10/04/09). 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 Cough well controlled. He is on Letaris + Tyvaso. He had R heart cath at [facility] in Spring 2012, I don't have data. Needs 6 minute walk.   ROV 08/20/11 -- COPD, hx psoriatic arthritis (Dr Tonia Brooms) and associated ILD, profound hypoxemia and associated Pulm HTN (PAP 57/26 mean 37, PAOP 14/11 mean 7 by cath 10/04/09). 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  6 min walk distance 146mtoday. 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 Couldn't walk as far today, feels more dyspneic overall. Using 6L/min per oximizer. [facility]arel[facility]. On Spiriva + albuterol. Pt wants me to stop his aldactone and increase lasix, doesn't like the aldactone because of gynecomastia.   ROV 10/05/11 -- COPD, hx psoriatic arthritis (Dr GTonia Brooms and associated ILD, profound hypoxemia and associated Pulm HTN (PAP 57/26 mean 37, PAOP 14/11 mean 7 by cath 10/04/09). 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 Has spoken with CAldrichabout increasing his Tyvaso to 12 puffs qid. I did not do this because I don't know of any studies to support. Still occasionally does the rehab maintenance program. LFT done today.   ROV 11/25/11 -- COPD, hx psoriatic arthritis (Dr GTonia Brooms and associated ILD, profound hypoxemia and associated Pulm HTN (PAP 57/26 mean 37, PAOP 14/11 mean 7 by cath 10/04/09). 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 Tyvaso increased to 12 puffs 4x a day in 6/13 but not sure if it has helped, remains on xarelto and letaris 5. After wt gain of 10 lbs and more SOB, lasix  temporarily increase to 856mbid, then back down to 4026mid. He felt that his breathing was better when he was < 190 lbs, currently at 194lbs.   ROV 12/25/11 -- COPD, hx psoriatic arthritis (Dr GruTonia Broomsnd associated ILD, profound hypoxemia and associated Pulm HTN. On Letaris + Tyvaso. He remains hypoxic with exertion, even on oximizer.  Last time we continued 12 puffs tyvaso. He feels stable, able to exert some. Since last time he [facility] in coming months. Wants the flu shot.   ROV 03/11/12 -- COPD, hx psoriatic arthritis (Dr GruTonia Broomsnd associated ILD, profound hypoxemia and associated Pulm HTN. On Letaris + Tyvaso 12 puffs. He remains hypoxic with exertion, even  on oximizer. Due for 6 minute walk in March. He has been having trouble with nose dryness, bleeding, due to high flow O2. He has tried nasal saline, saline gel, etc. Still very problematic.  His breathing has been fairly stable.   ROV 05/17/12 -- COPD, hx psoriatic arthritis (Dr Tonia Brooms) and associated ILD, profound hypoxemia and associated Pulm HTN. On Letaris + Tyvaso 12 puffs. Has been very difficult to keep him oxygenated, he is having progressive dyspnea. Has been [facility] for [facility] but not for [place]. He has more SOB, now unable to walk up the stairs - has started sleeping downstairs/. He uses albuterol tid (an increase) since last month.   ROV 06/22/12 -- COPD, hx psoriatic arthritis (Dr Tonia Brooms) and associated ILD, profound hypoxemia and associated Pulm HTN. On Letaris + Tyvaso 12 puffs.  He is profoundly hypoxemic and I suspect he needs IV therapy. He is still having problems with epistaxis, has seen ENT - due to his high flow O2. Last time we increased pred to 61m empirically - he may be recovering more quickly after exertion. Last TTE PASP was 50-60, 12/23/11.   [facility]  [facility]9 for acute resp failure w/ bronchitis, superimposed on ILD associated w/ PAH w/  decompensated cor pulmonale .  Tx w/ abx, steroids , diuretics.  Since discharge he is feeling some better. But still weak no energy.  Some ankle edema worse in evening.  No hemoptysis, chest pain , fever or n/v.  Letaris was increased during admission , needs rx sent to pharmacy.  Currenlty on tapering dose of steorids, baseline is 163mdaily   08/08/12 -- chronic resp failure and severe PAH due to ILD, obstructive disease. He was admitted recently w decompensation and hypoxemia, ? After a URI. We increased letaris to 1047motherwise no real changes in therapy. Steroids tapering to 22m44med daily.  He reports more LE and foot edema. For the last week he has increased his lasix to 80mg54m.   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 He has severe chronic hypoxemia. He is on lasix 80 bid, tyvaso 12 puffs, letaris 10, pred 20, humira. We tried going down to pred 10, but his breathing worsened so we went back to 20 - he improved. His edema is better on the increased lasix.   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 He has severe chronic hypoxemia. We have adjusted his diuretics recently given wt gain, more dyspnea. Taking zaroxolyn, planning to increase to 5mg o49mon and Fri. Oxygenation is marginal on high flow O2. Remains on pred 20.  He is wondering about using salt substitute - KCl. He also needs a wheelchair.   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 He has severe chronic hypoxemia. He is on lasix but unsure of the dosing, tyvaso 12 puffs, letaris 10, pred 20, humira. He uses metolazone usually Mon and Fri, adjusts lasix based on wt.   ROV 07/17/13 -- chronic resp failure and severe PAH due to auto-immune ILD (psoriatic  arthritis), obstructive disease. He has severe chronic hypoxemia. He finally got his letaris approved. He is having more exertional sob and hypoxemia over the last several weeks. His wt fluctuates, he drops on Mon and  Friday when he takes metolazone. Wt 183 - 187.  Tyvaso 12 puffs qid. He has been on metolazone tiw in the past, but had a hard time tolerating. Minimal sputum, no cough, no wheeze. More anxious - scared to sleep, scared to take a shower worried that something bad will happen.    Objective:   Physical Exam Filed Vitals:   07/17/13 1524  BP: 128/74  Pulse: 79  Height: _0  (1.727 m)  Weight: 187 lb (84.823 kg)  SpO2: 85%   Gen: Pleasant, hyperpigmented face, no distress  ENT: No lesions,  mouth clear,  oropharynx clear, no postnasal drip, throat clearing. Nasal mucosa red and swollen bilaterally  Neck: No JVD, no TMG, no carotid bruits  Lungs: No use of accessory muscles, Bibasilar insp crackles. No wheeze  Cardiovascular: RRR, heart sounds normal, no murmur or gallops, trace pretibial edema  Musculoskeletal: No deformities, no cyanosis or clubbing  Neuro: alert, non focal  Skin: Warm, no lesions or rashes, hyperpigmented face psoriatic patches along arms and legs    Assessment & Plan:  Pulmonary hypertension Extremely complicated case with multifactorial PAH. I don;t believe there is anything else we can do with the medications he is on. Will need to consider IV therapy although he may not benefit much at this point.   Please continue your current medications as you are taking them Please call our office if you would like [place] to refer you to consider IV therapy for pulmonary hypertension .  Follow with Dr Lamonte Sakai in 3 months or sooner if you have any problems.

## 2013-07-20 ENCOUNTER — Telehealth: Payer: Self-pay | Admitting: Emergency Medicine

## 2013-07-20 ENCOUNTER — Other Ambulatory Visit: Payer: Self-pay | Admitting: Emergency Medicine

## 2013-07-20 DIAGNOSIS — I272 Pulmonary hypertension, unspecified: Secondary | ICD-10-CM

## 2013-07-20 NOTE — Telephone Encounter (Signed)
Spoke with pt and he is wanting to do IV therapy.  Advised him I would put in the referral for [facility] and someone would call him early next week. Order placed

## 2013-08-12 ENCOUNTER — Other Ambulatory Visit: Payer: Self-pay | Admitting: Emergency Medicine

## 2013-08-21 ENCOUNTER — Other Ambulatory Visit: Payer: Self-pay | Admitting: Emergency Medicine

## 2013-09-01 ENCOUNTER — Telehealth: Payer: Self-pay | Admitting: Emergency Medicine

## 2013-09-01 NOTE — Telephone Encounter (Signed)
I called made pt aware of MW recs. He voiced his understanding. Will forward to RB for further recs. Please advise RB thanks

## 2013-09-01 NOTE — Telephone Encounter (Signed)
Pt was started on revatio when he want to [facility]. They were rightly cautious in considering IV vasodilator therapy given Mr Pates's underlying lung disease na the potential for V/q mismatching. It's too early to tell if he would ultimately benefit from the Revatio, but clearly he is having significant side effect of fluid retention. I believe he should hold the Revatio, follow his weight. I will review plans with [facility] once we insure that he is stable - he may not be a candidate for any increase in his therapy. Pt will call me next week to check in

## 2013-09-01 NOTE — Telephone Encounter (Signed)
Pt reports starting Revatio IV Therapy on 08/30/13--gained 6lb since starting.  Concerned with fluid retention. Informed that patient that Dr Lamonte Sakai is working in the hospital this afternoon but will not be available to address this until after 3pm today. Pt did not want to wait, would like to have one of our other physicians address this matter asap.  Please advise Dr Melvyn Novas as Dr Lamonte Sakai is not available until 3pm. Thanks.

## 2013-09-01 NOTE — Telephone Encounter (Signed)
Ok to take an extra lasix now but in terms of changing the revatio this shouldn't be done without Dr Agustina Caroli review

## 2013-09-13 ENCOUNTER — Telehealth: Payer: Self-pay | Admitting: Emergency Medicine

## 2013-09-13 NOTE — Telephone Encounter (Signed)
RB and I spoke about this letter.  We found qualifying sats from pts visit at [facility] on 4/27.  I spoke with Mariann Laster at [facility], she stated that we can fax that paper to her at her attn and this would qualify for the patient.  I have faxed this.  Nothing further needed.

## 2013-09-13 NOTE — Telephone Encounter (Signed)
Called pt, he said that he faxed a handwritten letter the other day and enclosed a letter from his pharmacy regarding "medicare and paying his bills". I told him that I would look into where that paperwork was and would get back to him.  I found the paperwork in RB's look-at folder.  He is reviewing the note before we leave today to see what we need to do proceed for this patient.

## 2013-09-17 ENCOUNTER — Other Ambulatory Visit: Payer: Self-pay | Admitting: Emergency Medicine

## 2013-09-18 ENCOUNTER — Other Ambulatory Visit: Payer: Self-pay | Admitting: Emergency Medicine

## 2013-09-18 ENCOUNTER — Telehealth: Payer: Self-pay | Admitting: Emergency Medicine

## 2013-09-18 MED ORDER — AMBRISENTAN 10 MG PO TABS: ORAL_TABLET | ORAL | Status: DC

## 2013-09-18 NOTE — Telephone Encounter (Signed)
Rx for letaris has been sent to [facility]. Nothing further needed

## 2013-10-02 ENCOUNTER — Ambulatory Visit (INDEPENDENT_AMBULATORY_CARE_PROVIDER_SITE_OTHER): Payer: BC Managed Care – PPO | Admitting: Emergency Medicine

## 2013-10-02 ENCOUNTER — Encounter: Payer: Self-pay | Admitting: Emergency Medicine

## 2013-10-02 VITALS — BP 128/78 | HR 87 | Ht 68.0 in | Wt 185.0 lb

## 2013-10-02 DIAGNOSIS — J9620 Acute and chronic respiratory failure, unspecified whether with hypoxia or hypercapnia: Secondary | ICD-10-CM

## 2013-10-02 DIAGNOSIS — I272 Pulmonary hypertension, unspecified: Secondary | ICD-10-CM

## 2013-10-02 DIAGNOSIS — J8410 Pulmonary fibrosis, unspecified: Secondary | ICD-10-CM

## 2013-10-02 DIAGNOSIS — I2789 Other specified pulmonary heart diseases: Secondary | ICD-10-CM

## 2013-10-02 NOTE — Assessment & Plan Note (Signed)
-  continue same pred

## 2013-10-02 NOTE — Assessment & Plan Note (Signed)
Will need to get high flow O2 for 1.00 NRB mask and oximizer to his home and for portable use Thru [employer]

## 2013-10-02 NOTE — Patient Instructions (Signed)
Please continue your medications as you have been taking them We will work on getting / insuring high flow oxygen for your face mask and your oximizer from [place] Please continue your Myrtis Hopping and Tonie Griffith We will not restart Revatio We will have to discuss with [facility] whether there is any benefit to trying IV PAH therapy. This may not be any improvement over our current medications.  Follow with Dr Lamonte Sakai in 2 months or sooner if you have any problems.

## 2013-10-02 NOTE — Assessment & Plan Note (Signed)
Please continue your medications as you have been taking them We will work on getting / insuring high flow oxygen for your face mask and your oximizer from [facility] Please continue your Letaris and Tyvaso We will not restart Revatio We will have to discuss with [facility] whether there is any benefit to trying IV PAH therapy. This may not be any improvement over our current medications.  Follow with Dr Toben Acuna in 2 months or sooner if you have any problems.  

## 2013-10-02 NOTE — Progress Notes (Signed)
Subjective:    Patient ID: [name], male    DOB: 1950/12/25, 63 y.o.   MRN: [id number] HPI 63 yo man, former smoker with COPD, hx psoriatic arthritis and associated ILD, profound hypoxemia and associated Pulm HTN (PAP 57/26 mean 37, PAOP 14/11 mean 7 by cath 10/04/09. 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 Admitted 6/21-27 with resp failure and decompensated R heart failure. He was diuresed, treated with corticosteroids and started on bronchodilators for his COPD. O2 was titrated to 5L/min at rest, he is leaving it on 5 with exertion.   ROV 02/05/10 -- 63yo, PAH with Hx psoriatic arthritis, ILD and R heart failure, hypoxemia, on chronic pred 57m once daily. Also hx COPD.  On letaris since September '11. Since last time breathing may be a bit better. He has good days and bad days. Rarely uses ventolin. No HA, no syncope or near syncope. LE edema has been well controlled and stable. Was desaturated while walking today on presentation. Has been gaining some wt, aldactone was decreased by Dr SElisabeth Caraa month ago. PFTs done today.   ROV 04/16/10 -- Hx as above, follows up after TTE and 6 min walk. Returns feeling a bit more SOB w exertion. 6 min walk distance = 1644massoc with significant desat to 75% (after 2 min). Estimated RVSP 7944m(up from 59 in 09/2009). Has been on letaris since September. Edema seems better on lasix 40m8mo times a day. Clearly both TTE and walk distance are worse.   ROV 05/19/10 -- returns for f/u of the above issues. last time we started Tyvaso in addition to his letaris. Has been on it for a month. Up to 9 puffs 4x a day. Has been assoc with some HA, occas cough right after doing it, not intrusive. On Pred 10mg65miriva + ventoilin as needed. He is not noticing any big changes in his breathing yet on the Tyvaso. Monthly LFT's normal 1/17.   ROV 07/29/10 -- COPD, psoriatic arthritis + ILD, PAH. Regimen is Tyvaso + Letaris. [facility]. They have told him he needs screening CSY, EtOH and tobacco random screening, counseling. Tells me today that his breathing is stable, although he has had trouble w allergies and w the pollen. Taking spiriva + SABA. O2 is set on 6L/min via oximizer.   ROV 09/23/10 -- COPD, psoriatic arthritis + ILD, PAH. He is feeling well, has been doing pulm rehab. Has been maintained on Tyvaso, Letaris. [facility] >> cont on current regimen  POST HOSP 10/10/10  Pt returns for post hospital follow up  Pt was admitted   6/11-6/15 for acute on chronic resp failure w/ underlying PF/RA/PAH.  CT chest was neg for PE, chronic changes along with diffuse ASPDZ -?alveolitis vs pulm edema. BNP >2000 . At discharge BNP 548.  He did receive increased steroids and diuresis along pulmonary hygiene.   Since discharge he is better but still remains weak and wears out easily.  Ankles are still puffy esp in the evening. He says he is off his spironolactone but unclear why-no mention in discharge summary why this was held   Today BMET shows K+ 3.7    ROV 10/24/10 -- COPD, hx psoriatic arthritis and associated ILD, profound hypoxemia and associated Pulm HTN (PAP 57/26 mean 37, PAOP 14/11 mean 7 by cath 10/04/09. 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 Admitted  6/11 - 15, still quite limited, not back to full speed.  Biggest complaint is exertional dyspnea and hypoxemia - to 60's on oximizer 6L/min. He continues to have exertional hypoxemia.   ROV 01/20/11 -- COPD, hx psoriatic arthritis (Dr Tonia Brooms) and associated ILD, profound hypoxemia and associated Pulm HTN (PAP 57/26 mean 37, PAOP 14/11 mean 7 by cath 10/04/09). 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 We had him evaluated for possible transplant, turned down at [facility]. Using 5-6L/min via oximizer at rest, up to 8-10L/min when moving. Using Spiriva and albuterol prn. Continues to have exertional SOB, may be worse. He is coughing more,  has nasal congestion last few days. Taking zyrtec. He is using some saline. Has humidity in his O2 concentrator. Still in the [facility] pulm rehab maintenance program.    ROV 05/18/11 -- COPD, hx psoriatic arthritis (Dr Tonia Brooms) and associated ILD, profound hypoxemia and associated Pulm HTN (PAP 57/26 mean 37, PAOP 14/11 mean 7 by cath 10/04/09). 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 Cough well controlled. He is on Letaris + Tyvaso. He had R heart cath at [facility] in Spring 2012, I don't have data. Needs 6 minute walk.   ROV 08/20/11 -- COPD, hx psoriatic arthritis (Dr Tonia Brooms) and associated ILD, profound hypoxemia and associated Pulm HTN (PAP 57/26 mean 37, PAOP 14/11 mean 7 by cath 10/04/09). 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  6 min walk distance 146mtoday. 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 Couldn't walk as far today, feels more dyspneic overall. Using 6L/min per oximizer. [facility] + xarelto. On Spiriva + albuterol. Pt wants me to stop his aldactone and increase lasix, doesn't like the aldactone because of gynecomastia.   ROV 10/05/11 -- COPD, hx psoriatic arthritis (Dr GTonia Brooms and associated ILD, profound hypoxemia and associated Pulm HTN (PAP 57/26 mean 37, PAOP 14/11 mean 7 by cath 10/04/09). 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 Has spoken with CAldrichabout increasing his Tyvaso to 12 puffs qid. I did not do this because I don't know of any studies to support. Still occasionally does the rehab maintenance program. LFT done today.   ROV 11/25/11 -- COPD, hx psoriatic arthritis (Dr GTonia Brooms and associated ILD, profound hypoxemia and associated Pulm HTN (PAP 57/26 mean 37, PAOP 14/11 mean 7 by cath 10/04/09). 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 Tyvaso increased to 12 puffs 4x a day in 6/13 but not sure if it has helped, remains on xarelto and letaris 5. After wt gain of 10 lbs and more SOB, lasix  temporarily increase to 856mbid, then back down to 4026mid. He felt that his breathing was better when he was < 190 lbs, currently at 194lbs.   ROV 12/25/11 -- COPD, hx psoriatic arthritis (Dr GruTonia Broomsnd associated ILD, profound hypoxemia and associated Pulm HTN. On Letaris + Tyvaso. He remains hypoxic with exertion, even on oximizer.  Last time we continued 12 puffs tyvaso. He feels stable, able to exert some. Since last time he [facility] in coming months. Wants the flu shot.   ROV 03/11/12 -- COPD, hx psoriatic arthritis (Dr GruTonia Broomsnd associated ILD, profound hypoxemia and associated Pulm HTN. On Letaris + Tyvaso 12 puffs. He remains hypoxic with exertion, even  on oximizer. Due for 6 minute walk in March. He has been having trouble with nose dryness, bleeding, due to high flow O2. He has tried nasal saline, saline gel, etc. Still very problematic.  His breathing has been fairly stable.   ROV 05/17/12 -- COPD, hx psoriatic arthritis (Dr Tonia Brooms) and associated ILD, profound hypoxemia and associated Pulm HTN. On Letaris + Tyvaso 12 puffs. Has been very difficult to keep him oxygenated, he is having progressive dyspnea. Has been seen at [facility] for [facility] but not for [place]. He has more SOB, now unable to walk up the stairs - has started sleeping downstairs/. He uses albuterol tid (an increase) since last month.   ROV 06/22/12 -- COPD, hx psoriatic arthritis (Dr Tonia Brooms) and associated ILD, profound hypoxemia and associated Pulm HTN. On Letaris + Tyvaso 12 puffs.  He is profoundly hypoxemic and I suspect he needs IV therapy. He is still having problems with epistaxis, has seen ENT - due to his high flow O2. Last time we increased pred to 28m empirically - he may be recovering more quickly after exertion. Last TTE PASP was 50-60, 12/23/11.   [facility]  [facility]9 for acute resp failure w/ bronchitis, superimposed on ILD associated w/ PAH w/  decompensated cor pulmonale .  Tx w/ abx, steroids , diuretics.  Since discharge he is feeling some better. But still weak no energy.  Some ankle edema worse in evening.  No hemoptysis, chest pain , fever or n/v.  Letaris was increased during admission , needs rx sent to pharmacy.  Currenlty on tapering dose of steorids, baseline is 180mdaily   08/08/12 -- chronic resp failure and severe PAH due to ILD, obstructive disease. He was admitted recently w decompensation and hypoxemia, ? After a URI. We increased letaris to 1060motherwise no real changes in therapy. Steroids tapering to 13m39med daily.  He reports more LE and foot edema. For the last week he has increased his lasix to 80mg23m.   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 He has severe chronic hypoxemia. He is on lasix 80 bid, tyvaso 12 puffs, letaris 10, pred 20, humira. We tried going down to pred 10, but his breathing worsened so we went back to 20 - he improved. His edema is better on the increased lasix.   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 He has severe chronic hypoxemia. We have adjusted his diuretics recently given wt gain, more dyspnea. Taking zaroxolyn, planning to increase to 5mg o55mon and Fri. Oxygenation is marginal on high flow O2. Remains on pred 20.  He is wondering about using salt substitute - KCl. He also needs a wheelchair.   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 He has severe chronic hypoxemia. He is on lasix but unsure of the dosing, tyvaso 12 puffs, letaris 10, pred 20, humira. He uses metolazone usually Mon and Fri, adjusts lasix based on wt.   ROV 07/17/13 -- chronic resp failure and severe PAH due to auto-immune ILD (psoriatic  arthritis), obstructive disease. He has severe chronic hypoxemia. He finally got his letaris approved. He is having more exertional sob and hypoxemia over the last several weeks. His wt fluctuates, he drops on Mon and  Friday when he takes metolazone. Wt 183 - 187.  Tyvaso 12 puffs qid. He has been on metolazone tiw in the past, but had a hard time tolerating. Minimal sputum, no cough, no wheeze. More anxious - scared to sleep, scared to take a shower worried that something bad will happen.   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 He has severe chronic hypoxemia. He has been on tyvaso, letaris, diuretic regimen. At [facility] they added Revatio as opposed to start IV therapy. He had an increase in edema, stopped it soon after. Chronic pred for auto-immune disease. Alternates between oximizer, 1.00 NRB mask. Suspect that he will not be a good candidate for IV therapy if he hasn't responded to Revatio.   Need to change the orders from [employer] re: his O2 orders     Objective:   Physical Exam Filed Vitals:   10/02/13 1415  BP: 128/78  Pulse: 87  Height: _0  (1.727 m)  Weight: 185 lb (83.915 kg)  SpO2: 93%   Gen: Pleasant, hyperpigmented face, no distress  ENT: No lesions,  mouth clear,  oropharynx clear, no postnasal drip, throat clearing. Nasal mucosa red and swollen bilaterally  Neck: No JVD, no TMG, no carotid bruits  Lungs: No use of accessory muscles, Bibasilar insp crackles. No wheeze  Cardiovascular: RRR, heart sounds normal, no murmur or gallops, trace pretibial edema  Musculoskeletal: No deformities, no cyanosis or clubbing  Neuro: alert, non focal  Skin: Warm, no lesions or rashes, hyperpigmented face psoriatic patches along arms and legs    Assessment & Plan:  Acute and chronic respiratory failure Will need to get high flow O2 for 1.00 NRB mask and oximizer to his home and for portable use  Thru [place] - continue same pred  Pulmonary hypertension Please continue your medications as you have been taking them We will work on getting / insuring high flow oxygen for your face mask and your oximizer from [place] Please continue your Letaris and Tyvaso We will not restart Revatio We will have to discuss with [facility] whether there is any benefit to trying IV PAH therapy. This may not be any improvement over our current medications.  Follow with Dr Lamonte Sakai in 2 months or sooner if you have any problems

## 2013-10-11 ENCOUNTER — Other Ambulatory Visit: Payer: Self-pay | Admitting: Emergency Medicine

## 2013-12-05 ENCOUNTER — Other Ambulatory Visit: Payer: Self-pay | Admitting: Emergency Medicine

## 2013-12-11 ENCOUNTER — Telehealth: Payer: Self-pay | Admitting: *Deleted

## 2013-12-11 NOTE — Telephone Encounter (Signed)
OPEN IN ERROR

## 2013-12-11 NOTE — Telephone Encounter (Signed)
MAILED TO [facility]--- PHYSICIAN'S ORDER SIGNED - 10/17/13  O2 AND CONCENTRATOR, LQUID OxYGEN PORTABLE

## 2013-12-15 ENCOUNTER — Encounter: Payer: Self-pay | Admitting: Emergency Medicine

## 2013-12-15 ENCOUNTER — Ambulatory Visit (INDEPENDENT_AMBULATORY_CARE_PROVIDER_SITE_OTHER): Payer: BC Managed Care – PPO | Admitting: Emergency Medicine

## 2013-12-15 VITALS — BP 120/78 | HR 82 | Temp 98.1°F | Ht 68.0 in | Wt 184.0 lb

## 2013-12-15 DIAGNOSIS — I272 Pulmonary hypertension, unspecified: Secondary | ICD-10-CM

## 2013-12-15 DIAGNOSIS — I2789 Other specified pulmonary heart diseases: Secondary | ICD-10-CM

## 2013-12-15 DIAGNOSIS — J9620 Acute and chronic respiratory failure, unspecified whether with hypoxia or hypercapnia: Secondary | ICD-10-CM

## 2013-12-15 DIAGNOSIS — L408 Other psoriasis: Secondary | ICD-10-CM

## 2013-12-15 NOTE — Progress Notes (Signed)
Subjective:    Patient ID: [name], male    DOB: 1950/12/25, 63 y.o.   MRN: [id number] HPI 63 yo man, former smoker with COPD, hx psoriatic arthritis and associated ILD, profound hypoxemia and associated Pulm HTN (PAP 57/26 mean 37, PAOP 14/11 mean 7 by cath 10/04/09. 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 Admitted 6/21-27 with resp failure and decompensated R heart failure. He was diuresed, treated with corticosteroids and started on bronchodilators for his COPD. O2 was titrated to 5L/min at rest, he is leaving it on 5 with exertion.   ROV 02/05/10 -- 63yo, PAH with Hx psoriatic arthritis, ILD and R heart failure, hypoxemia, on chronic pred 57m once daily. Also hx COPD.  On letaris since September '11. Since last time breathing may be a bit better. He has good days and bad days. Rarely uses ventolin. No HA, no syncope or near syncope. LE edema has been well controlled and stable. Was desaturated while walking today on presentation. Has been gaining some wt, aldactone was decreased by Dr SElisabeth Caraa month ago. PFTs done today.   ROV 04/16/10 -- Hx as above, follows up after TTE and 6 min walk. Returns feeling a bit more SOB w exertion. 6 min walk distance = 1644massoc with significant desat to 75% (after 2 min). Estimated RVSP 7944m(up from 59 in 09/2009). Has been on letaris since September. Edema seems better on lasix 40m8mo times a day. Clearly both TTE and walk distance are worse.   ROV 05/19/10 -- returns for f/u of the above issues. last time we started Tyvaso in addition to his letaris. Has been on it for a month. Up to 9 puffs 4x a day. Has been assoc with some HA, occas cough right after doing it, not intrusive. On Pred 10mg59miriva + ventoilin as needed. He is not noticing any big changes in his breathing yet on the Tyvaso. Monthly LFT's normal 1/17.   ROV 07/29/10 -- COPD, psoriatic arthritis + ILD, PAH. Regimen is Tyvaso + Letaris. [facility]. They have told him he needs screening CSY, EtOH and tobacco random screening, counseling. Tells me today that his breathing is stable, although he has had trouble w allergies and w the pollen. Taking spiriva + SABA. O2 is set on 6L/min via oximizer.   ROV 09/23/10 -- COPD, psoriatic arthritis + ILD, PAH. He is feeling well, has been doing pulm rehab. Has been maintained on Tyvaso, Letaris. [facility] >> cont on current regimen  POST HOSP 10/10/10  Pt returns for post hospital follow up  Pt was admitted   6/11-6/15 for acute on chronic resp failure w/ underlying PF/RA/PAH.  CT chest was neg for PE, chronic changes along with diffuse ASPDZ -?alveolitis vs pulm edema. BNP >2000 . At discharge BNP 548.  He did receive increased steroids and diuresis along pulmonary hygiene.   Since discharge he is better but still remains weak and wears out easily.  Ankles are still puffy esp in the evening. He says he is off his spironolactone but unclear why-no mention in discharge summary why this was held   Today BMET shows K+ 3.7    ROV 10/24/10 -- COPD, hx psoriatic arthritis and associated ILD, profound hypoxemia and associated Pulm HTN (PAP 57/26 mean 37, PAOP 14/11 mean 7 by cath 10/04/09. 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 Admitted  6/11 - 15, still quite limited, not back to full speed.  Biggest complaint is exertional dyspnea and hypoxemia - to 60's on oximizer 6L/min. He continues to have exertional hypoxemia.   ROV 01/20/11 -- COPD, hx psoriatic arthritis (Dr Tonia Brooms) and associated ILD, profound hypoxemia and associated Pulm HTN (PAP 57/26 mean 37, PAOP 14/11 mean 7 by cath 10/04/09). 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 We had him evaluated for possible transplant, turned down at [facility]. Using 5-6L/min via oximizer at rest, up to 8-10L/min when moving. Using Spiriva and albuterol prn. Continues to have exertional SOB, may be worse. He is coughing more,  has nasal congestion last few days. Taking zyrtec. He is using some saline. Has humidity in his O2 concentrator. Still in the [facility] pulm rehab maintenance program.    ROV 05/18/11 -- COPD, hx psoriatic arthritis (Dr Tonia Brooms) and associated ILD, profound hypoxemia and associated Pulm HTN (PAP 57/26 mean 37, PAOP 14/11 mean 7 by cath 10/04/09). 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 Cough well controlled. He is on Letaris + Tyvaso. He had R heart cath at [facility] in Spring 2012, I don't have data. Needs 6 minute walk.   ROV 08/20/11 -- COPD, hx psoriatic arthritis (Dr Tonia Brooms) and associated ILD, profound hypoxemia and associated Pulm HTN (PAP 57/26 mean 37, PAOP 14/11 mean 7 by cath 10/04/09). 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  6 min walk distance 146mtoday. 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 Couldn't walk as far today, feels more dyspneic overall. Using 6L/min per oximizer. [facility] + xarelto. On Spiriva + albuterol. Pt wants me to stop his aldactone and increase lasix, doesn't like the aldactone because of gynecomastia.   ROV 10/05/11 -- COPD, hx psoriatic arthritis (Dr GTonia Brooms and associated ILD, profound hypoxemia and associated Pulm HTN (PAP 57/26 mean 37, PAOP 14/11 mean 7 by cath 10/04/09). 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 Has spoken with CAldrichabout increasing his Tyvaso to 12 puffs qid. I did not do this because I don't know of any studies to support. Still occasionally does the rehab maintenance program. LFT done today.   ROV 11/25/11 -- COPD, hx psoriatic arthritis (Dr GTonia Brooms and associated ILD, profound hypoxemia and associated Pulm HTN (PAP 57/26 mean 37, PAOP 14/11 mean 7 by cath 10/04/09). 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 Tyvaso increased to 12 puffs 4x a day in 6/13 but not sure if it has helped, remains on xarelto and letaris 5. After wt gain of 10 lbs and more SOB, lasix  temporarily increase to 856mbid, then back down to 4026mid. He felt that his breathing was better when he was < 190 lbs, currently at 194lbs.   ROV 12/25/11 -- COPD, hx psoriatic arthritis (Dr GruTonia Broomsnd associated ILD, profound hypoxemia and associated Pulm HTN. On Letaris + Tyvaso. He remains hypoxic with exertion, even on oximizer.  Last time we continued 12 puffs tyvaso. He feels stable, able to exert some. Since last time he [facility] in coming months. Wants the flu shot.   ROV 03/11/12 -- COPD, hx psoriatic arthritis (Dr GruTonia Broomsnd associated ILD, profound hypoxemia and associated Pulm HTN. On Letaris + Tyvaso 12 puffs. He remains hypoxic with exertion, even  on oximizer. Due for 6 minute walk in March. He has been having trouble with nose dryness, bleeding, due to high flow O2. He has tried nasal saline, saline gel, etc. Still very problematic.  His breathing has been fairly stable.   ROV 05/17/12 -- COPD, hx psoriatic arthritis (Dr Tonia Brooms) and associated ILD, profound hypoxemia and associated Pulm HTN. On Letaris + Tyvaso 12 puffs. Has been very difficult to keep him oxygenated, he is having progressive dyspnea. Has been [facility] for transplant but not for [place]. He has more SOB, now unable to walk up the stairs - has started sleeping downstairs/. He uses albuterol tid (an increase) since last month.   ROV 06/22/12 -- COPD, hx psoriatic arthritis (Dr Tonia Brooms) and associated ILD, profound hypoxemia and associated Pulm HTN. On Letaris + Tyvaso 12 puffs.  He is profoundly hypoxemic and I suspect he needs IV therapy. He is still having problems with epistaxis, has seen ENT - due to his high flow O2. Last time we increased pred to 61m empirically - he may be recovering more quickly after exertion. Last TTE PASP was 50-60, 12/23/11.   [facility]  [facility]9 for acute resp failure w/ bronchitis, superimposed on ILD associated w/ PAH w/  decompensated cor pulmonale .  Tx w/ abx, steroids , diuretics.  Since discharge he is feeling some better. But still weak no energy.  Some ankle edema worse in evening.  No hemoptysis, chest pain , fever or n/v.  Letaris was increased during admission , needs rx sent to pharmacy.  Currenlty on tapering dose of steorids, baseline is 163mdaily   08/08/12 -- chronic resp failure and severe PAH due to ILD, obstructive disease. He was admitted recently w decompensation and hypoxemia, ? After a URI. We increased letaris to 1047motherwise no real changes in therapy. Steroids tapering to 22m44med daily.  He reports more LE and foot edema. For the last week he has increased his lasix to 80mg54m.   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 He has severe chronic hypoxemia. He is on lasix 80 bid, tyvaso 12 puffs, letaris 10, pred 20, humira. We tried going down to pred 10, but his breathing worsened so we went back to 20 - he improved. His edema is better on the increased lasix.   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 He has severe chronic hypoxemia. We have adjusted his diuretics recently given wt gain, more dyspnea. Taking zaroxolyn, planning to increase to 5mg o49mon and Fri. Oxygenation is marginal on high flow O2. Remains on pred 20.  He is wondering about using salt substitute - KCl. He also needs a wheelchair.   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 He has severe chronic hypoxemia. He is on lasix but unsure of the dosing, tyvaso 12 puffs, letaris 10, pred 20, humira. He uses metolazone usually Mon and Fri, adjusts lasix based on wt.   ROV 07/17/13 -- chronic resp failure and severe PAH due to auto-immune ILD (psoriatic  arthritis), obstructive disease. He has severe chronic hypoxemia. He finally got his letaris approved. He is having more exertional sob and hypoxemia over the last several weeks. His wt fluctuates, he drops on Mon and  Friday when he takes metolazone. Wt 183 - 187.  Tyvaso 12 puffs qid. He has been on metolazone tiw in the past, but had a hard time tolerating. Minimal sputum, no cough, no wheeze. More anxious - scared to sleep, scared to take a shower worried that something bad will happen.   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 He has severe chronic hypoxemia. He has been on tyvaso, letaris, diuretic regimen. At [facility] they added Revatio as opposed to start IV therapy. He had an increase in edema, stopped it soon after. Chronic pred for auto-immune disease. Alternates between oximizer, 1.00 NRB mask. Suspect that he will not be a good candidate for IV therapy if he hasn't responded to Revatio.   Need to change the orders from [place] re: his O2 orders    ROV 12/15/13 -- chronic resp failure with severe PAh and hypoxemia, auto-immune ILD (psoriatic), COPD. Currently on tyvaso, letaris, diuretic regimen, chronic pred 20. He was on sildenifil > stopped. He gained wt on the revatio, is now back down to 184 lbs. He feels stable. No AE's. He has been dealing w gout in L great toe.    Objective:   Physical Exam Filed Vitals:   12/15/13 1409  BP: 120/78  Pulse: 82  Temp: 98.1 F (36.7 C)  TempSrc: Oral  Height: _0  (1.727 m)  Weight: 184 lb (83.462 kg)  SpO2: 91%   Gen: Pleasant, hyperpigmented face, no distress  ENT: No lesions,  mouth clear,  oropharynx clear, no postnasal drip, throat clearing. Nasal mucosa red and swollen bilaterally  Neck: No JVD, no TMG, no carotid bruits  Lungs: No use of accessory muscles, Bibasilar insp crackles. No wheeze  Cardiovascular: RRR, heart sounds normal, no murmur or  gallops, trace pretibial edema  Musculoskeletal: No deformities, no cyanosis or clubbing  Neuro: alert, non focal  Skin: Warm, no lesions or rashes, hyperpigmented face psoriatic patches along arms and legs    Assessment & Plan:  PSORIASIS - continue pred 20 qd   Pulmonary hypertension Gained wt and edema with revatio. Continue tyvaso, letaris, diuretics. He is likely not a good candidate for IV therapy.   Acute and chronic respiratory failure Needs [facility] to insure that he is using his 1.00 mask correctly, using correct flow rate

## 2013-12-15 NOTE — Patient Instructions (Signed)
Please continue your current medications as you have been using them.  We will trouble-shoot your oxygen delivery system with [employer]. They will likely need to send a respiratory therapist to see your system.  Follow with Dr Lamonte Sakai in 3 months or sooner if you have any problems.

## 2013-12-15 NOTE — Assessment & Plan Note (Signed)
Needs [facility] to insure that he is using his 1.00 mask correctly, using correct flow rate

## 2013-12-15 NOTE — Assessment & Plan Note (Signed)
Gained wt and edema with revatio. Continue tyvaso, letaris, diuretics. He is likely not a good candidate for IV therapy.

## 2013-12-15 NOTE — Assessment & Plan Note (Signed)
-  continue pred 20 qd

## 2013-12-18 ENCOUNTER — Telehealth: Payer: Self-pay | Admitting: Emergency Medicine

## 2013-12-18 MED ORDER — TIOTROPIUM BROMIDE MONOHYDRATE 18 MCG IN CAPS: ORAL_CAPSULE | RESPIRATORY_TRACT | Status: AC

## 2013-12-18 NOTE — Telephone Encounter (Signed)
Called and lmom to make the pt aware that the rx has been sent to the pharmacy and if anything further needed to call back.

## 2013-12-21 ENCOUNTER — Telehealth: Payer: Self-pay | Admitting: Emergency Medicine

## 2013-12-21 NOTE — Telephone Encounter (Signed)
LMTCB for the pt 

## 2013-12-22 ENCOUNTER — Telehealth: Payer: Self-pay | Admitting: Emergency Medicine

## 2013-12-22 DIAGNOSIS — J9620 Acute and chronic respiratory failure, unspecified whether with hypoxia or hypercapnia: Secondary | ICD-10-CM

## 2013-12-22 NOTE — Telephone Encounter (Signed)
lmtcb x1 [name]

## 2013-12-22 NOTE — Telephone Encounter (Signed)
Spoke with [name]  They are needing updated order for o2  I have sent order to [facility] for this  Nothing further needed

## 2013-12-22 NOTE — Telephone Encounter (Signed)
Mariann Laster returned call

## 2013-12-26 ENCOUNTER — Telehealth: Payer: Self-pay | Admitting: Emergency Medicine

## 2013-12-26 DIAGNOSIS — I2781 Cor pulmonale (chronic): Secondary | ICD-10-CM

## 2013-12-26 DIAGNOSIS — J8410 Pulmonary fibrosis, unspecified: Secondary | ICD-10-CM

## 2013-12-26 NOTE — Telephone Encounter (Signed)
Called spoke with [facility]. They have original order for pt to use 6-8 liters O2. Pt has been using 9 liters O2 on the concentrator in addition to 10-15 l/m w/ non re breather. She needs clarification on pt O2 order. Please advise thanks

## 2013-12-26 NOTE — Telephone Encounter (Signed)
Called pt. He reports he did not call us about him needing a PA for his spiriva. He reports he has what he needs. Will sign off.

## 2013-12-26 NOTE — Telephone Encounter (Signed)
[phone]

## 2013-12-26 NOTE — Telephone Encounter (Signed)
lmtcb

## 2013-12-28 NOTE — Telephone Encounter (Signed)
LMTCB X1 FOR [name] Order placed

## 2013-12-28 NOTE — Telephone Encounter (Signed)
[name] calling back a/b pt o2 order.Hillery Hunter

## 2013-12-28 NOTE — Telephone Encounter (Signed)
Please advise RB thanks 

## 2013-12-28 NOTE — Telephone Encounter (Signed)
The new flow rates are correct. Please update his standing order to 9L/min + 10-15L with non-rebreather

## 2013-12-29 ENCOUNTER — Telehealth: Payer: Self-pay | Admitting: *Deleted

## 2013-12-29 DIAGNOSIS — J8410 Pulmonary fibrosis, unspecified: Secondary | ICD-10-CM

## 2013-12-29 DIAGNOSIS — I272 Pulmonary hypertension, unspecified: Secondary | ICD-10-CM

## 2013-12-29 NOTE — Telephone Encounter (Signed)
I received a call back from [facility] states that with the current order they are not really able to care for this pt any longer. She states he would require 70 tanks a week and because they are a small company they do not have the staff or the tanks to continue to provide care for this pt. She wanted to know if there was another company that could better serve this pt. I advised we will send an order to [facility] to see what we can work out for the pt.  I spoke with the pt and advised. He states understanding and is ok with changing companies. He just wants to make sure that he will be provided with the amount of oxygen he needs. I assured him we would do everything possible to make sure he has what he needs. I have sent an order to [facility] to look into another DME for this pt. [place] Bing, CMA

## 2013-12-29 NOTE — Telephone Encounter (Signed)
Order placed and wanda is aware.[place] Bing, CMA

## 2013-12-29 NOTE — Telephone Encounter (Signed)
lmomtcb for [employer]

## 2014-01-05 ENCOUNTER — Telehealth: Payer: Self-pay | Admitting: Emergency Medicine

## 2014-01-05 NOTE — Telephone Encounter (Signed)
Pt is calling back & can be reached at [phone].  Satira Anis

## 2014-01-05 NOTE — Telephone Encounter (Signed)
Pt called back-he is able to come by on Tuesday 01-09-14 at 4:00pm to have his O2 levels rechecked for insurance purposes. Nothing more needed at this time.

## 2014-01-05 NOTE — Telephone Encounter (Signed)
LMTCB

## 2014-01-05 NOTE — Telephone Encounter (Signed)
Pt returned call [phone]

## 2014-01-05 NOTE — Telephone Encounter (Signed)
Spoke with Mariann Laster at [facility]-she states she needs sats on patient to get payment for his O2. There are not any in EPIC and therefore we will need to call the patient and have him come by our office to get this. Once we have the results we need to fax them to Attn: [name] at [phone].   I have called the patient and left a message-need to have patient come by so we can get sats for [employer],

## 2014-01-05 NOTE — Telephone Encounter (Signed)
[phone] (Home) is patients home number.Pt states he will need to call us back this afternoon or early Monday to let us know when he can come by next week to have sats checked. We will need to add to injection schedule to know when patient is coming but this can not be charged for as this is not the patients fault. Whoever is in Triage the day he comes will need to check O2 levels, document in [facility] note box and then NC1 (no charge visit) under injection schedule-if any questions please ask Joellen Jersey. Thanks.

## 2014-01-08 ENCOUNTER — Other Ambulatory Visit: Payer: Self-pay | Admitting: Emergency Medicine

## 2014-01-09 ENCOUNTER — Ambulatory Visit (INDEPENDENT_AMBULATORY_CARE_PROVIDER_SITE_OTHER): Payer: Medicare Other | Admitting: Emergency Medicine

## 2014-01-09 DIAGNOSIS — I2781 Cor pulmonale (chronic): Secondary | ICD-10-CM

## 2014-01-09 DIAGNOSIS — I279 Pulmonary heart disease, unspecified: Secondary | ICD-10-CM

## 2014-01-09 NOTE — Progress Notes (Signed)
   Subjective:    Patient ID: [name], male    DOB: 11/27/1950, 63 y.o.   MRN: [id number]  HPI    Review of Systems     Objective:   Physical Exam        Assessment & Plan:  Pt came in to re qualify for 02. Pt was 87% 10 liters on non re breather.

## 2014-02-12 ENCOUNTER — Telehealth: Payer: Self-pay | Admitting: Emergency Medicine

## 2014-02-12 ENCOUNTER — Other Ambulatory Visit: Payer: Self-pay | Admitting: Cardiology

## 2014-02-13 MED ORDER — PREDNISONE 20 MG PO TABS: 20.0000 mg | ORAL_TABLET | Freq: Every day | ORAL | Status: AC

## 2014-02-13 NOTE — Telephone Encounter (Signed)
Patient requesting refill on Prednisone.  Ok to refill?

## 2014-02-13 NOTE — Addendum Note (Signed)
Addended by: Virl Cagey on: 02/13/2014 05:15 PM   Modules accepted: Orders

## 2014-02-13 NOTE — Telephone Encounter (Signed)
Spoke with pt-- aware that Pred 30m - Take 1 tab QD sent to [facility] [address].  Nothing further needed.

## 2014-02-13 NOTE — Telephone Encounter (Signed)
Yes this is ok 

## 2014-02-13 NOTE — Telephone Encounter (Signed)
Rx was sent to pharmacy electronically.

## 2014-03-13 ENCOUNTER — Ambulatory Visit: Payer: Medicare Other | Admitting: Emergency Medicine

## 2014-03-13 ENCOUNTER — Encounter: Payer: Self-pay | Admitting: Emergency Medicine

## 2014-03-13 VITALS — BP 126/78 | HR 94 | Ht 68.0 in | Wt 181.5 lb

## 2014-03-13 DIAGNOSIS — I272 Pulmonary hypertension, unspecified: Secondary | ICD-10-CM

## 2014-03-13 DIAGNOSIS — Z23 Encounter for immunization: Secondary | ICD-10-CM

## 2014-03-13 NOTE — Patient Instructions (Signed)
Please continue your current medications patient has been taking them Continue your oxygen at all times We will refer you to speak with representatives from hospice to discuss there available services Follow with Dr Lamonte Sakai in 2 months

## 2014-03-13 NOTE — Assessment & Plan Note (Signed)
Please continue your current medications patient has been taking them Continue your oxygen at all times We will refer you to speak with representatives from hospice to discuss there available services Follow with Dr Lamonte Sakai in 2 months

## 2014-03-13 NOTE — Progress Notes (Signed)
Subjective:    Patient ID: [name], male    DOB: 07-03-1950, 63 y.o.   MRN: [id number] HPI 63 yo man, former smoker with COPD, hx psoriatic arthritis and associated ILD, profound hypoxemia and associated Pulm HTN (PAP 57/26 mean 37, PAOP 14/11 mean 7 by cath 10/04/09. 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 Admitted 6/21-27 with resp failure and decompensated R heart failure. He was diuresed, treated with corticosteroids and started on bronchodilators for his COPD. O2 was titrated to 5L/min at rest, he is leaving it on 5 with exertion.    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 He has severe chronic hypoxemia. He is on lasix but unsure of the dosing, tyvaso 12 puffs, letaris 10, pred 20, humira. He uses metolazone usually Mon and Fri, adjusts lasix based on wt.   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 He has severe chronic hypoxemia. He finally got his letaris approved. He is having more exertional sob and hypoxemia over the last several weeks. His wt fluctuates, he drops on Mon and Friday when he takes metolazone. Wt 183 - 187.  Tyvaso 12 puffs qid. He has been on metolazone tiw in the past, but had a hard time tolerating. Minimal sputum, no cough, no wheeze. More anxious - scared to sleep, scared to take a shower worried that something bad will happen.   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 He has severe chronic hypoxemia. He has been on tyvaso, letaris, diuretic regimen. At [facility] they added Revatio as opposed to start IV therapy. He had an increase in edema, stopped it soon after. Chronic pred for auto-immune disease. Alternates between oximizer, 1.00 NRB mask. Suspect that he will not be a good candidate for IV therapy if he hasn't responded to Revatio.    Need to change the orders from [place] re: his O2 orders    ROV 12/15/13 -- chronic resp failure with severe PAh and hypoxemia, auto-immune ILD (psoriatic), COPD. Currently on tyvaso, letaris, diuretic regimen, chronic pred 20. He was on sildenifil > stopped. He gained wt on the revatio, is now back down to 184 lbs. He feels stable. No AE's. He has been dealing w gout in L great toe.   ROV 03/13/14 -- follow-up visit chronic resp failure with severe PAh and hypoxemia, auto-immune ILD (psoriatic), COPD.  His wt has been stable at 181-182. He has been progressively SOB over the last 2 months. He desaturates easily, has been having trouble with [facility] regarding his O2 needs   Objective:   Physical Exam Filed Vitals:   03/13/14 1458  BP: 126/78  Pulse: 94  Height: _0  (1.727 m)  Weight: 181 lb 8 oz (82.328 kg)  SpO2: 89%   Gen: Pleasant, hyperpigmented face, no distress  ENT: No lesions,  mouth clear,  oropharynx clear, no postnasal drip, throat clearing. Nasal mucosa red and swollen bilaterally  Neck: No JVD, no TMG, no carotid bruits  Lungs: No use of accessory muscles, Bibasilar insp crackles. No wheeze  Cardiovascular: RRR, heart sounds normal, no murmur or gallops, trace pretibial edema  Musculoskeletal: No deformities, no cyanosis or clubbing  Neuro: alert, non focal  Skin: Warm, no lesions or rashes, hyperpigmented face psoriatic patches along arms and legs    Assessment & Plan:  Pulmonary hypertension Please continue your current medications patient has been taking them Continue your oxygen at all times We will refer you to speak with representatives from hospice to discuss there available services Follow with Dr Lamonte Sakai in 2 months

## 2014-03-14 ENCOUNTER — Other Ambulatory Visit: Payer: Self-pay | Admitting: Emergency Medicine

## 2014-03-16 ENCOUNTER — Telehealth: Payer: Self-pay | Admitting: Emergency Medicine

## 2014-03-16 NOTE — Telephone Encounter (Signed)
Dr. Myrle Sheng called and wants to discuss the visit they had with the pt. He states you can call him at [phone]. [place] Bing, CMA

## 2014-03-19 NOTE — Telephone Encounter (Signed)
I discussed with him today. Thank you

## 2014-03-21 ENCOUNTER — Inpatient Hospital Stay (HOSPITAL_COMMUNITY): Payer: BC Managed Care – PPO

## 2014-03-21 ENCOUNTER — Telehealth: Payer: Self-pay | Admitting: Emergency Medicine

## 2014-03-21 ENCOUNTER — Encounter (HOSPITAL_COMMUNITY): Payer: Self-pay | Admitting: *Deleted

## 2014-03-21 ENCOUNTER — Inpatient Hospital Stay (HOSPITAL_COMMUNITY)
Admission: AD | Admit: 2014-03-21 | Discharge: 2014-03-27 | DRG: 314 | Disposition: A | Discharge status: Home / Self Care | Payer: BC Managed Care – PPO | Source: Ambulatory Visit | Attending: Pulmonary Disease | Admitting: Pulmonary Disease

## 2014-03-21 DIAGNOSIS — D751 Secondary polycythemia: Secondary | ICD-10-CM | POA: Diagnosis present

## 2014-03-21 DIAGNOSIS — I272 Other secondary pulmonary hypertension: Secondary | ICD-10-CM | POA: Diagnosis present

## 2014-03-21 DIAGNOSIS — I739 Peripheral vascular disease, unspecified: Secondary | ICD-10-CM | POA: Diagnosis present

## 2014-03-21 DIAGNOSIS — Z515 Encounter for palliative care: Secondary | ICD-10-CM | POA: Diagnosis not present

## 2014-03-21 DIAGNOSIS — R0600 Dyspnea, unspecified: Secondary | ICD-10-CM | POA: Diagnosis present

## 2014-03-21 DIAGNOSIS — M069 Rheumatoid arthritis, unspecified: Secondary | ICD-10-CM | POA: Diagnosis present

## 2014-03-21 DIAGNOSIS — I82509 Chronic embolism and thrombosis of unspecified deep veins of unspecified lower extremity: Secondary | ICD-10-CM | POA: Diagnosis present

## 2014-03-21 DIAGNOSIS — G6289 Other specified polyneuropathies: Secondary | ICD-10-CM | POA: Diagnosis present

## 2014-03-21 DIAGNOSIS — J9621 Acute and chronic respiratory failure with hypoxia: Secondary | ICD-10-CM | POA: Diagnosis present

## 2014-03-21 DIAGNOSIS — I270 Primary pulmonary hypertension: Secondary | ICD-10-CM

## 2014-03-21 DIAGNOSIS — E119 Type 2 diabetes mellitus without complications: Secondary | ICD-10-CM | POA: Diagnosis present

## 2014-03-21 DIAGNOSIS — I2510 Atherosclerotic heart disease of native coronary artery without angina pectoris: Secondary | ICD-10-CM | POA: Diagnosis present

## 2014-03-21 DIAGNOSIS — E785 Hyperlipidemia, unspecified: Secondary | ICD-10-CM | POA: Diagnosis present

## 2014-03-21 DIAGNOSIS — I2781 Cor pulmonale (chronic): Secondary | ICD-10-CM | POA: Diagnosis present

## 2014-03-21 DIAGNOSIS — Z66 Do not resuscitate: Secondary | ICD-10-CM | POA: Diagnosis present

## 2014-03-21 DIAGNOSIS — Z85828 Personal history of other malignant neoplasm of skin: Secondary | ICD-10-CM

## 2014-03-21 DIAGNOSIS — J449 Chronic obstructive pulmonary disease, unspecified: Secondary | ICD-10-CM | POA: Diagnosis present

## 2014-03-21 DIAGNOSIS — L4050 Arthropathic psoriasis, unspecified: Secondary | ICD-10-CM | POA: Diagnosis present

## 2014-03-21 DIAGNOSIS — Z87891 Personal history of nicotine dependence: Secondary | ICD-10-CM | POA: Diagnosis not present

## 2014-03-21 DIAGNOSIS — J8489 Other specified interstitial pulmonary diseases: Secondary | ICD-10-CM | POA: Diagnosis present

## 2014-03-21 DIAGNOSIS — J9620 Acute and chronic respiratory failure, unspecified whether with hypoxia or hypercapnia: Secondary | ICD-10-CM | POA: Diagnosis present

## 2014-03-21 LAB — CBC WITH DIFFERENTIAL/PLATELET
Basophils Absolute: 0 10*3/uL (ref 0.0–0.1)
Basophils Relative: 0 % (ref 0–1)
EOS ABS: 0 10*3/uL (ref 0.0–0.7)
EOS PCT: 0 % (ref 0–5)
HCT: 56.2 % — ABNORMAL HIGH (ref 39.0–52.0)
HEMOGLOBIN: 18.3 g/dL — AB (ref 13.0–17.0)
LYMPHS ABS: 0.7 10*3/uL (ref 0.7–4.0)
Lymphocytes Relative: 6 % — ABNORMAL LOW (ref 12–46)
MCH: 32.7 pg (ref 26.0–34.0)
MCHC: 32.6 g/dL (ref 30.0–36.0)
MCV: 100.5 fL — AB (ref 78.0–100.0)
MONO ABS: 0.4 10*3/uL (ref 0.1–1.0)
Monocytes Relative: 4 % (ref 3–12)
NEUTROS PCT: 90 % — AB (ref 43–77)
Neutro Abs: 10.5 10*3/uL — ABNORMAL HIGH (ref 1.7–7.7)
Platelets: 123 10*3/uL — ABNORMAL LOW (ref 150–400)
RBC: 5.59 MIL/uL (ref 4.22–5.81)
RDW: 15.8 % — ABNORMAL HIGH (ref 11.5–15.5)
WBC: 11.7 10*3/uL — ABNORMAL HIGH (ref 4.0–10.5)

## 2014-03-21 LAB — POTASSIUM: Potassium: 3.4 mEq/L — ABNORMAL LOW (ref 3.7–5.3)

## 2014-03-21 LAB — COMPREHENSIVE METABOLIC PANEL
ALT: 55 U/L — ABNORMAL HIGH (ref 0–53)
AST: 39 U/L — ABNORMAL HIGH (ref 0–37)
Albumin: 3.7 g/dL (ref 3.5–5.2)
Alkaline Phosphatase: 80 U/L (ref 39–117)
Anion gap: 16 — ABNORMAL HIGH (ref 5–15)
BILIRUBIN TOTAL: 0.9 mg/dL (ref 0.3–1.2)
BUN: 29 mg/dL — ABNORMAL HIGH (ref 6–23)
CALCIUM: 9.3 mg/dL (ref 8.4–10.5)
CHLORIDE: 95 meq/L — AB (ref 96–112)
CO2: 30 meq/L (ref 19–32)
CREATININE: 1.19 mg/dL (ref 0.50–1.35)
GFR calc non Af Amer: 62 mL/min — ABNORMAL LOW (ref >90)
GFR, EST AFRICAN AMERICAN: 72 mL/min — AB (ref >90)
Glucose, Bld: 133 mg/dL — ABNORMAL HIGH (ref 70–99)
Potassium: 4.9 mEq/L (ref 3.7–5.3)
Sodium: 141 mEq/L (ref 137–147)
Total Protein: 6.6 g/dL (ref 6.0–8.3)

## 2014-03-21 LAB — PROCALCITONIN: PROCALCITONIN: 0.15 ng/mL

## 2014-03-21 LAB — MAGNESIUM: Magnesium: 1.7 mg/dL (ref 1.5–2.5)

## 2014-03-21 LAB — MRSA PCR SCREENING: MRSA BY PCR: NEGATIVE

## 2014-03-21 LAB — PRO B NATRIURETIC PEPTIDE: Pro B Natriuretic peptide (BNP): 7619 pg/mL — ABNORMAL HIGH (ref 0–125)

## 2014-03-21 MED ORDER — TREPROSTINIL 0.6 MG/ML IN SOLN
18.0000 ug | Freq: Four times a day (QID) | RESPIRATORY_TRACT | Status: DC
  Administered 2014-03-21: 18 ug via RESPIRATORY_TRACT
  Filled 2014-03-21 (×30): qty 0.03

## 2014-03-21 MED ORDER — TIOTROPIUM BROMIDE MONOHYDRATE 18 MCG IN CAPS
18.0000 ug | ORAL_CAPSULE | Freq: Every day | RESPIRATORY_TRACT | Status: DC
  Administered 2014-03-22 – 2014-03-27 (×5): 18 ug via RESPIRATORY_TRACT
  Filled 2014-03-21: qty 5

## 2014-03-21 MED ORDER — FEBUXOSTAT 40 MG PO TABS
40.0000 mg | ORAL_TABLET | Freq: Every day | ORAL | Status: DC
  Administered 2014-03-22 – 2014-03-27 (×6): 40 mg via ORAL
  Filled 2014-03-21 (×6): qty 1

## 2014-03-21 MED ORDER — FLUTICASONE PROPIONATE 50 MCG/ACT NA SUSP
2.0000 | Freq: Every day | NASAL | Status: DC
  Administered 2014-03-23 – 2014-03-27 (×5): 2 via NASAL
  Filled 2014-03-21: qty 16

## 2014-03-21 MED ORDER — SIMVASTATIN 20 MG PO TABS
20.0000 mg | ORAL_TABLET | Freq: Every day | ORAL | Status: DC
  Administered 2014-03-21 – 2014-03-22 (×2): 20 mg via ORAL
  Filled 2014-03-21 (×3): qty 1

## 2014-03-21 MED ORDER — FOLIC ACID 1 MG PO TABS
1.0000 mg | ORAL_TABLET | Freq: Every day | ORAL | Status: DC
  Administered 2014-03-22 – 2014-03-27 (×6): 1 mg via ORAL
  Filled 2014-03-21 (×6): qty 1

## 2014-03-21 MED ORDER — RIVAROXABAN 20 MG PO TABS
20.0000 mg | ORAL_TABLET | Freq: Every day | ORAL | Status: DC
  Administered 2014-03-21 – 2014-03-26 (×6): 20 mg via ORAL
  Filled 2014-03-21 (×7): qty 1

## 2014-03-21 MED ORDER — ALBUTEROL SULFATE (2.5 MG/3ML) 0.083% IN NEBU
2.5000 mg | INHALATION_SOLUTION | Freq: Four times a day (QID) | RESPIRATORY_TRACT | Status: DC | PRN
  Administered 2014-03-21: 2.5 mg via RESPIRATORY_TRACT
  Filled 2014-03-21: qty 3

## 2014-03-21 MED ORDER — ARFORMOTEROL TARTRATE 15 MCG/2ML IN NEBU
15.0000 ug | INHALATION_SOLUTION | Freq: Two times a day (BID) | RESPIRATORY_TRACT | Status: DC
  Administered 2014-03-21 – 2014-03-27 (×11): 15 ug via RESPIRATORY_TRACT
  Filled 2014-03-21 (×15): qty 2

## 2014-03-21 MED ORDER — FUROSEMIDE 10 MG/ML IJ SOLN
40.0000 mg | Freq: Three times a day (TID) | INTRAMUSCULAR | Status: AC
  Administered 2014-03-21 – 2014-03-22 (×3): 40 mg via INTRAVENOUS
  Filled 2014-03-21 (×3): qty 4

## 2014-03-21 MED ORDER — GLIMEPIRIDE 2 MG PO TABS
2.0000 mg | ORAL_TABLET | Freq: Every day | ORAL | Status: DC
  Administered 2014-03-22 – 2014-03-27 (×6): 2 mg via ORAL
  Filled 2014-03-21 (×8): qty 1

## 2014-03-21 MED ORDER — AMBRISENTAN 10 MG PO TABS
10.0000 mg | ORAL_TABLET | Freq: Every day | ORAL | Status: DC
  Administered 2014-03-22 – 2014-03-27 (×6): 10 mg via ORAL
  Filled 2014-03-21 (×3): qty 1

## 2014-03-21 MED ORDER — SODIUM CHLORIDE 0.9 % IJ SOLN
3.0000 mL | Freq: Two times a day (BID) | INTRAMUSCULAR | Status: DC
  Administered 2014-03-21 – 2014-03-27 (×12): 3 mL via INTRAVENOUS

## 2014-03-21 MED ORDER — SODIUM CHLORIDE 0.9 % IJ SOLN: 3.0000 mL | INTRAMUSCULAR | Status: DC | PRN

## 2014-03-21 MED ORDER — TREPROSTINIL 0.6 MG/ML IN SOLN
18.0000 ug | RESPIRATORY_TRACT | Status: DC
  Administered 2014-03-21 – 2014-03-27 (×21): 18 ug via RESPIRATORY_TRACT

## 2014-03-21 MED ORDER — PANTOPRAZOLE SODIUM 40 MG PO TBEC
40.0000 mg | DELAYED_RELEASE_TABLET | Freq: Every day | ORAL | Status: DC
  Administered 2014-03-22 – 2014-03-27 (×6): 40 mg via ORAL
  Filled 2014-03-21 (×6): qty 1

## 2014-03-21 MED ORDER — METOLAZONE 5 MG PO TABS
5.0000 mg | ORAL_TABLET | Freq: Every day | ORAL | Status: DC
  Administered 2014-03-22 – 2014-03-27 (×6): 5 mg via ORAL
  Filled 2014-03-21 (×7): qty 1

## 2014-03-21 MED ORDER — PREDNISONE 20 MG PO TABS
20.0000 mg | ORAL_TABLET | Freq: Every day | ORAL | Status: DC
  Administered 2014-03-22 – 2014-03-27 (×6): 20 mg via ORAL
  Filled 2014-03-21 (×8): qty 1

## 2014-03-21 MED ORDER — SODIUM CHLORIDE 0.9 % IV SOLN: 250.0000 mL | INTRAVENOUS | Status: DC | PRN

## 2014-03-21 MED ORDER — BUDESONIDE 0.5 MG/2ML IN SUSP
0.5000 mg | Freq: Two times a day (BID) | RESPIRATORY_TRACT | Status: DC
  Administered 2014-03-21 – 2014-03-27 (×10): 0.5 mg via RESPIRATORY_TRACT
  Filled 2014-03-21 (×15): qty 2

## 2014-03-21 NOTE — Telephone Encounter (Signed)
Spoke with patient again. Wife states that O2 is still low, 83%. Chest pain and tightness. Pt states that he is doing "okay" Still wants rec's from [place]. Refuses to go to ED Will send again and page again.

## 2014-03-21 NOTE — Telephone Encounter (Signed)
Spoke with [name], pt is clearly progressively worse - more dyspnea, CP, hypoxemia. Unclear to me whether this reflects slow progression of his disease. I suspect instead that he has decompensated cor pulmonale. He will need to be admitted for slow, safe volume removal (if possible).  He also has COPD, ILD associated with auto-immune disease that could be contributing. We have recently broached the subject of Hospice care. If we aren't able to get him to a compensated state by optimizing his volume status then will need to push this issue further. He will be admitted to a SDU bed at [facility], wife will bring him from home to admitting.   Derek Apo, MD, PhD 03/21/2014, 11:04 AM [facility] [phone] or if no answer [phone]

## 2014-03-21 NOTE — Plan of Care (Signed)
Problem: Phase I Progression Outcomes Goal: O2 sats > or equal 90% or at baseline Outcome: Progressing Goal: Dyspnea controlled at rest Outcome: Progressing Goal: Hemodynamically stable Outcome: Progressing Goal: Flu/PneumoVaccines if indicated Outcome: Not Applicable Date Met:  43/15/40 Goal: Pain controlled Outcome: Not Applicable Date Met:  08/67/61 Goal: Progress activity as tolerated unless otherwise ordered Outcome: Progressing

## 2014-03-21 NOTE — Progress Notes (Signed)
Pt has been moving in bed from lying position to sitting position. Pt states no pain. Pt has had no requests except for ice and some fluids but pt understands he is on fluid restriction. Pt alert and oriented. Pt still has some labored breathing on exertion. Pt's oxygen saturation in low 90's when he is resting. Passing on to night nurse.

## 2014-03-21 NOTE — H&P (Signed)
Name: [name] MRN: [id number] DOB: Jun 10, 1950    ADMISSION DATE:  03/21/2014   CHIEF COMPLAINT:  Progressive dyspnea and hypoxia   BRIEF PATIENT DESCRIPTION:  24 yom f/b [place] for chronic resp failure due to severe secondary PAH in setting of underlying COPD and psoriatic arthritis related ILD. Maintained at home on: 19 liters of O2, letairis, tyvaso, BDs and diuretics. Admitted 12/2 w/ progressive acute on chronic resp failure.   SIGNIFICANT EVENTS    STUDIES:  3/14: ECHO: The cavity size was moderately reduced.Wall thickness was increased in a pattern of moderate LVH. Systolic function was normal. The estimated ejectionfraction was in the range of 60% to 65%. Wall motion was normal; there were no regional wall motion abnormalities.Doppler parameters are consistent with abnormal left ventricular relaxation (grade 1 diastolic dysfunction).    HISTORY OF PRESENT ILLNESS:   72 yom f/b [place] for chronic resp failure due to severe secondary PAH in setting of underlying COPD and psoriatic arthritis related ILD. Maintained at home on: 19 liters of O2, letairis, tyvaso, BDs and diuretics. Called the pulmonary office on 12/2 increased SOB and severe chest tightness with pain. Felt like someone is "tearing his heart out". Reports O2 levels are 73% and 90HR. Was on 18 liters of oxygen. We had recommended calling 911 but pt refused and wanted to speak to Dr Lamonte Sakai, who advised and set up direct admission to the SDU. On arrival he notes: ~ 1 week progressive dyspnea, no cough, fever, possibly chills. Chest pain for last 2 days w/ desaturation events. No LE swelling. Has had increased abd swelling. No sick exposures. Admitted w/ above symptoms    PAST MEDICAL HISTORY :   has a past medical history of Pulmonary hypertension; Thrombocytopenia, unspecified; Chronic pulmonary heart disease, unspecified; Peripheral vascular disease, unspecified; Other and unspecified hyperlipidemia;  Postinflammatory pulmonary fibrosis; Other psoriasis; Rheumatoid arthritis(714.0); Chronic respiratory failure; Hypoxemia; Chronic airway obstruction, not elsewhere classified; Hyperlipidemia; Perforation of colon (10/2001); Internal hemorrhoids; Diverticulosis; Shortness of breath; Chronic right-sided HF (heart failure); Headache(784.0); Cancer (skin); History of DVT (deep vein thrombosis) - right leg; and CAD (coronary artery disease), native coronary artery (2012).  has past surgical history that includes Repair of cecal perforation (10/2001); Colon surgery (2003); Back surgery; Tonsillectomy; doppler echocardiography (June 2012); Persantine Myoview (June 2012); and Left and Right Heart Catheterization (2012). Prior to Admission medications   Medication Sig Start Date End Date Taking? Authorizing Provider  acetaminophen (TYLENOL ARTHRITIS PAIN) 650 MG CR tablet Take 1,300 mg by mouth 2 (two) times daily.     Historical Provider, MD  adalimumab (HUMIRA) 40 MG/0.8ML injection Inject 40 mg into the skin every 14 (fourteen) days.     Historical Provider, MD  albuterol (PROVENTIL) (2.5 MG/3ML) 0.083% nebulizer solution Take 3 mLs (2.5 mg total) by nebulization every 6 (six) hours as needed for wheezing. 06/21/12   Collene Gobble, MD  b complex vitamins tablet Take 1 tablet by mouth daily.    Historical Provider, MD  calcium-vitamin D (OSCAL) 250-125 MG-UNIT per tablet Take 1 tablet by mouth 2 (two) times daily.    Historical Provider, MD  cetirizine (ZYRTEC) 10 MG tablet Take 10 mg by mouth daily.      Historical Provider, MD  fluticasone (FLONASE) 50 MCG/ACT nasal spray Place 2 sprays into the nose 2 (two) times daily. 01/19/13   Raylene Miyamoto, MD  folic acid (FOLVITE) 1 MG tablet Take 1 mg by mouth daily.      Historical  Provider, MD  furosemide (LASIX) 80 MG tablet Take 1-2 tablets (80-160 mg total) by mouth 2 (two) times daily. <please make appointment for refills> 02/13/14   Leonie Man, MD   glimepiride (AMARYL) 2 MG tablet Take 1 tablet (2 mg total) by mouth daily before breakfast. 01/30/13   Tammy S Parrett, NP  LETAIRIS 10 MG tablet TAKE 1 TABLET (10 MG) ORALLY DAILY. DO NOT HANDLE IF PREGNANT. DO NOTSPLIT, CRUSH OR CHEW. AVOID INHALATION AND CONTACT WITH SKIN OR EYE. 01/11/14   Collene Gobble, MD  metolazone (ZAROXOLYN) 5 MG tablet Take 5 mg by mouth 2 (two) times a week. 02/13/13   Collene Gobble, MD  metolazone (ZAROXOLYN) 5 MG tablet TAKE 1 TABLET 3 TIMES A WEEK OR AS DIRECTED 03/16/14   Collene Gobble, MD  Multiple Vitamin (MULTIVITAMIN) capsule Take 1 capsule by mouth daily.      Historical Provider, MD  OXYGEN-HELIUM IN 8+ Liters continuous inhalation    Historical Provider, MD  pantoprazole (PROTONIX) 40 MG tablet Take 40 mg by mouth daily.    Historical Provider, MD  potassium chloride SA (K-DUR,KLOR-CON) 20 MEQ tablet Take 40 mEq by mouth 4 (four) times daily.     Historical Provider, MD  predniSONE (DELTASONE) 20 MG tablet Take 1 tablet (20 mg total) by mouth daily with breakfast. 02/13/14   Collene Gobble, MD  simvastatin (ZOCOR) 20 MG tablet TAKE 1 TABLET DAILY AT BEDTIME.    Leonie Man, MD  tiotropium (SPIRIVA HANDIHALER) 18 MCG inhalation capsule PLACE 1 CAPSULE INTO INHALER AND INHALE INTO THE LUNGS ONCE DAILY 12/18/13   Collene Gobble, MD  Treprostinil (TYVASO IN) Inhale 4 puffs into the lungs 4 (four) times daily.     Historical Provider, MD  ULORIC 40 MG tablet Take 1 tablet by mouth daily. 12/11/13   Historical Provider, MD  XARELTO 20 MG TABS Take 20 mg by mouth daily.  05/14/11   Historical Provider, MD   No Known Allergies  FAMILY HISTORY:  family history includes Diabetes in his brother and mother; Heart disease in his mother. SOCIAL HISTORY:  reports that he quit smoking about 7 years ago. His smoking use included Cigarettes. He has a 84 pack-year smoking history. He has never used smokeless tobacco. He reports that he drinks about 0.6 oz of alcohol per  week. He reports that he does not use illicit drugs.  REVIEW OF SYSTEMS:   Constitutional: Negative for fever, chills, weight loss, malaise/fatigue and diaphoresis.  HENT: Negative for hearing loss, ear pain, nosebleeds, congestion, sore throat, neck pain, tinnitus and ear discharge.   Eyes: Negative for blurred vision, double vision, photophobia, pain, discharge and redness.  Respiratory: Negative for cough, hemoptysis, sputum production, shortness of breath, wheezing and stridor.   Cardiovascular: Negative for chest pain, w desaturation, palpitations, orthopnea, claudication, leg swelling and PND.  Gastrointestinal: Negative for heartburn, nausea, vomiting, abdominal pain, distention, anorexia, diarrhea, constipation, blood in stool and melena.  Genitourinary: Negative for dysuria, urgency, frequency, hematuria and flank pain.  Musculoskeletal: Negative for myalgias, back pain, joint pain and falls.  Skin: Negative for itching and rash.  Neurological: Negative for dizziness, tingling, tremors, sensory change, speech change, focal weakness, seizures, loss of consciousness, weakness and headaches.  Endo/Heme/Allergies: Negative for environmental allergies and polydipsia. Does not bruise/bleed easily.  SUBJECTIVE:  Resting dyspnea  VITAL SIGNS: Weight:  [81.9 kg (180 lb 8.9 oz)] 81.9 kg (180 lb 8.9 oz) (12/02 1245)  PHYSICAL EXAMINATION: General:  Acute on chronically ill appearing white male. Gets in distress w/ any exertion  Neuro:  No focal def  HEENT:  [place], + JVD  Cardiovascular:  II/VI SEM  Lungs:  Diffuse rales + accessory muscle use  Abdomen:  Distended + bowel sounds Musculoskeletal:  Intact  Skin:  Trace edema   No results for input(s): NA, K, CL, CO2, BUN, CREATININE, GLUCOSE in the last 168 hours. No results for input(s): HGB, HCT, WBC, PLT in the last 168 hours. No results found.  ASSESSMENT / PLAN:  Acute on Chronic respiratory failure due to decompensated severe  Pulmonary Artery HTN (PAP 57/26 mean 37, PAOP 14/11 mean 7 by cath 10/04/09) and cor pulmonale in setting of underlying COPD and psoriatic arthritis w/ associated ILD.  > last admit June this year for decompensated R heart failure. He was diuresed, treated with corticosteroids and started on bronchodilators for his COPD. O2 was titrated to 5L/min at rest, he was discharged  on 5 with exertion.  >baseline wt 183-187  Plan Admit to SDU IV diuresis  Cont Tyvaso and letaris  Scheduled BDs Wean FIO2 May need gentle inotrope support to assist w/ diuresis  Ck PCT, hold off on abx  Limited code: no CPR, defib, intubation   H/o chronic LE DVT  Plan Cont xarelto   DM Plan Cont amaryl Add ssi of glucose > [place] ACNP-BC [place] Pager # [phone] OR # [phone] if no answer  03/21/2014, 1:20 PM  Admit to SDU.  Code status clarified.  No intubation/CPR or cardioversion.  Admit for IV diureses.  Continue home medications as above.  Will continue to manage.  Patient seen and examined, agree with above note.  I dictated the care and orders written for this patient under my direction.  Rush Farmer, MD [phone]

## 2014-03-21 NOTE — Progress Notes (Signed)
Pt just got to floor. Pt came from home to doctors office and PCP admitted pt and directed him to come to floor as a direct admit. Pt is alert and oriented x 4. Pt has wife at bedside. Called telemetry to notify. Called [facility] and notified. Dr. Nelda Marseille in to see pt. Placed a 20 G iv to rt forarm, second attempt, blood return, pt tolerated well. Pt came on a non-rebreather at 15 liters and this is what pt is on currently. Pt states no pain. Pt is very red/blue in face and body. Pt's oxygen is registering on monitor in 90's. Will monitor.

## 2014-03-21 NOTE — Telephone Encounter (Signed)
Spoke with patient, states that he is having increased SOB and severe chest tightness with pain. Feels like someone is "tearing his heart out". I asked patient to put his pulse ox on and tell me what his oxygen levels are reading -- pt reports O2 levels are 73% and 90HR  Pt very labored breathing on the phone and had to put his wife on to speak with me. Spoke with wife, pt just got back from walking (walks at a slow pace) - 18Lpm O2 Wearing O2 currently, resting and O2 is slowly returning to baseline. Pt refuses to travel anywhere, states that it is too much on his body. I advised the patient and wife that with his current symptoms and his labored breathing that he needs to go to ED or call EMS to come out and assess him. Pt refused stating that he wants RB to do the assessment and tell him what to do. I advised the patient wife again to call 911 but the patient refused.  Will send to [place] to address.

## 2014-03-22 ENCOUNTER — Encounter (HOSPITAL_COMMUNITY): Payer: Self-pay | Admitting: Surgery

## 2014-03-22 LAB — BASIC METABOLIC PANEL
ANION GAP: 22 — AB (ref 5–15)
BUN: 28 mg/dL — ABNORMAL HIGH (ref 6–23)
CO2: 25 mEq/L (ref 19–32)
Calcium: 9.1 mg/dL (ref 8.4–10.5)
Chloride: 92 mEq/L — ABNORMAL LOW (ref 96–112)
Creatinine, Ser: 1.19 mg/dL (ref 0.50–1.35)
GFR, EST AFRICAN AMERICAN: 72 mL/min — AB (ref >90)
GFR, EST NON AFRICAN AMERICAN: 62 mL/min — AB (ref >90)
Glucose, Bld: 82 mg/dL (ref 70–99)
Potassium: 3.8 mEq/L (ref 3.7–5.3)
SODIUM: 139 meq/L (ref 137–147)

## 2014-03-22 LAB — CBC
HCT: 56.9 % — ABNORMAL HIGH (ref 39.0–52.0)
Hemoglobin: 18.8 g/dL — ABNORMAL HIGH (ref 13.0–17.0)
MCH: 32.4 pg (ref 26.0–34.0)
MCHC: 33 g/dL (ref 30.0–36.0)
MCV: 98.1 fL (ref 78.0–100.0)
Platelets: 111 10*3/uL — ABNORMAL LOW (ref 150–400)
RBC: 5.8 MIL/uL (ref 4.22–5.81)
RDW: 15.8 % — AB (ref 11.5–15.5)
WBC: 12.9 10*3/uL — ABNORMAL HIGH (ref 4.0–10.5)

## 2014-03-22 LAB — TROPONIN I: Troponin I: <0.3 ng/mL (ref <0.30)

## 2014-03-22 MED ORDER — FUROSEMIDE 10 MG/ML IJ SOLN
40.0000 mg | Freq: Two times a day (BID) | INTRAMUSCULAR | Status: DC
  Administered 2014-03-22: 40 mg via INTRAVENOUS
  Filled 2014-03-22 (×3): qty 4

## 2014-03-22 MED ORDER — ACETAMINOPHEN 325 MG PO TABS
650.0000 mg | ORAL_TABLET | Freq: Four times a day (QID) | ORAL | Status: DC | PRN
  Administered 2014-03-22: 650 mg via ORAL
  Filled 2014-03-22: qty 2

## 2014-03-22 MED ORDER — POTASSIUM CHLORIDE CRYS ER 20 MEQ PO TBCR
40.0000 meq | EXTENDED_RELEASE_TABLET | Freq: Two times a day (BID) | ORAL | Status: DC
  Administered 2014-03-22 (×3): 40 meq via ORAL
  Filled 2014-03-22 (×8): qty 2

## 2014-03-22 MED ORDER — FENTANYL CITRATE 0.05 MG/ML IJ SOLN
25.0000 ug | INTRAMUSCULAR | Status: DC | PRN
  Administered 2014-03-22 – 2014-03-27 (×6): 25 ug via INTRAVENOUS
  Filled 2014-03-22 (×6): qty 2

## 2014-03-22 NOTE — Plan of Care (Signed)
Problem: Consults Goal: Nutrition Consult-if indicated Outcome: Not Applicable Date Met:  55/97/41  Problem: ICU Phase Progression Outcomes Goal: Pain controlled with appropriate interventions Outcome: Completed/Met Date Met:  03/22/14 Goal: Other ICU Phase Outcomes/Goals Outcome: Not Applicable Date Met:  63/84/53  Problem: Phase I Progression Outcomes Goal: Tolerating diet Outcome: Completed/Met Date Met:  03/22/14

## 2014-03-22 NOTE — Plan of Care (Signed)
Problem: ICU Phase Progression Outcomes Goal: Dyspnea controlled at rest Outcome: Completed/Met Date Met:  03/22/14

## 2014-03-22 NOTE — Progress Notes (Signed)
Name: [name] MRN: [id number] DOB: [birth year]    ADMISSION DATE:  03/21/2014   CHIEF COMPLAINT:  Progressive dyspnea and hypoxia   BRIEF PATIENT DESCRIPTION:  26 yom f/b [place] for chronic resp failure due to severe secondary PAH in setting of underlying COPD and psoriatic arthritis related ILD. Maintained at home on: 19 liters of O2, letairis, tyvaso, BDs and diuretics. Admitted 12/2 w/ progressive acute on chronic resp failure.   SIGNIFICANT EVENTS    STUDIES:  3/14: ECHO: The cavity size was moderately reduced.Wall thickness was increased in a pattern of moderate LVH. Systolic function was normal. The estimated ejectionfraction was in the range of 60% to 65%. Wall motion was normal; there were no regional wall motion abnormalities.Doppler parameters are consistent with abnormal left ventricular relaxation (grade 1 diastolic dysfunction).   SUBJECTIVE:  Slightly improved.  Still SOB at rest above baseline.   VITAL SIGNS: Temp:  [97.2 F (36.2 C)-97.8 F (36.6 C)] 97.2 F (36.2 C) (12/03 1122) Pulse Rate:  [56-95] 80 (12/03 1122) Resp:  [14-21] 17 (12/03 1122) BP: (95-117)/(49-87) 101/49 mmHg (12/03 1122) SpO2:  [87 %-95 %] 87 % (12/03 1122) FiO2 (%):  [100 %] 100 % (12/03 0856) Weight:  [180 lb 8.9 oz (81.9 kg)] 180 lb 8.9 oz (81.9 kg) (12/03 0500)  PHYSICAL EXAMINATION: General:  Acute on chronically ill appearing white male. Diffuse cyanotic discoloration. Gets in distress w/ any exertion  Neuro:  No focal def  HEENT:  [place], + JVD  Cardiovascular:  II/VI SEM  Lungs:  Diffuse rales, no accessory muscle use  Abdomen:  Distended + bowel sounds Musculoskeletal:  Intact  Skin:  Trace edema    Recent Labs Lab 03/21/14 1515 03/21/14 2244 03/22/14 0535  NA 141  --  139  K 4.9 3.4* 3.8  CL 95*  --  92*  CO2 30  --  25  BUN 29*  --  28*  CREATININE 1.19  --  1.19  GLUCOSE 133*  --  82    Recent Labs Lab 03/21/14 1515 03/22/14 0535  HGB 18.3* 18.8*    HCT 56.2* 56.9*  WBC 11.7* 12.9*  PLT 123* 111*   Dg Chest Port 1 View  03/21/2014   CLINICAL DATA:  Worsening shortness of breath. Evaluate for pneumonia.  EXAM: PORTABLE CHEST - 1 VIEW  COMPARISON:  01/14/2013 and CT of 09/29/2010.  FINDINGS: Midline trachea. Moderate cardiomegaly. Pulmonary artery enlargement. Apparent right paratracheal soft tissue fullness may correspond to adenopathy on the remote CT. No definite pleural fluid. No pneumothorax. Interstitial lung disease which is moderate. No convincing evidence of acute superimposed process.  IMPRESSION: Interstitial lung disease, likely pulmonary fibrosis. No convincing evidence of acute superimposed process.  Cardiomegaly with suspicion of right mediastinal adenopathy. Consider further evaluation with chest CT. Of note, adenopathy is present on the 09/29/2010 CT.  Pulmonary artery enlargement suggests pulmonary arterial hypertension.   Electronically Signed   By: Abigail Miyamoto M.D.   On: 03/21/2014 14:57    ASSESSMENT / PLAN:  Acute on Chronic respiratory failure due to decompensated severe Pulmonary Artery HTN (PAP 57/26 mean 37, PAOP 14/11 mean 7 by cath 10/04/09) and cor pulmonale in setting of underlying COPD and psoriatic arthritis w/ associated ILD.  >baseline wt 183-187  Plan Cont IV diuresis  Cont Tyvaso and letaris  Scheduled BDs, pulmicort  Wean FIO2 May need gentle inotrope support to assist w/ diuresis - will hold off with slight clinical improvement  F/u CXR  NEG pct, cont to hold off on abx  Limited code: no CPR, defib, intubation    H/o chronic LE DVT Plan Cont xarelto    DM Plan Cont amaryl Add ssi of glucose > 150  Tx [address] [place], NP 03/22/2014  11:34 AM Pager: [phone] or [phone]  Continue current diureses, continue use of O2 at home level, no PT/OT (unable to perform), transfer tele and remain on PCCM service.  Patient seen and examined, agree with above note.  I dictated the  care and orders written for this patient under my direction.  Rush Farmer, MD [phone]

## 2014-03-22 NOTE — Progress Notes (Signed)
Central telemetry notified of patient having run of [place]. EKG done, Vitals stable, patient has c/o chest pain localized in left upper chest rated as a 3 on the pain level. Called and notified PCCM spoke with Dr. Elsworth Soho. Advised to encourage patient to take Potassium supplement. Will continue to monitor patient status.[name]

## 2014-03-22 NOTE — Plan of Care (Signed)
Problem: Phase I Progression Outcomes Goal: Other Phase II Outcomes/Goals Outcome: Not Applicable Date Met:  64/33/29

## 2014-03-22 NOTE — Progress Notes (Signed)
Called report to 5 [place] spoke with [facility] RN    Pt will be transferred per bed due to SOB with very little exertion.

## 2014-03-22 NOTE — Plan of Care (Signed)
Problem: Consults Goal: Respiratory Problems Patient Education See Patient Education Module for education specifics.  Outcome: Completed/Met Date Met:  03/22/14  Problem: ICU Phase Progression Outcomes Goal: Voiding-avoid urinary catheter unless indicated Outcome: Completed/Met Date Met:  03/22/14

## 2014-03-22 NOTE — Progress Notes (Addendum)
Pt transferred to [place] bed 12 per bed with all belongings. Pt on 100 % nonrebreather Limited code with meds only. Pt is nontele.

## 2014-03-22 NOTE — Plan of Care (Signed)
Problem: ICU Phase Progression Outcomes Goal: Flu/PneumoVaccines if indicated Outcome: Not Applicable Date Met:  03/22/14 Pt states he had his Flu shot this season      

## 2014-03-22 NOTE — Plan of Care (Signed)
Problem: Consults Goal: Skin Care Protocol Initiated - if Braden Score 18 or less If consults are not indicated, leave blank or document N/A Outcome: Not Applicable Date Met:  03/22/14     

## 2014-03-22 NOTE — Progress Notes (Addendum)
NURSING PROGRESS NOTE  [name] [id number] Transfer Data: 03/22/2014 6:06 PM Attending Provider: Rush Farmer, MD FVO:HKGOV,PCHEKB DAVIDSON, MD Code Status: Partial (only IV Drugs)  Allergies:  Review of patient's allergies indicates no known allergies. Past Medical History:   has a past medical history of Pulmonary hypertension; Thrombocytopenia, unspecified; Chronic pulmonary heart disease, unspecified; Peripheral vascular disease, unspecified; Other and unspecified hyperlipidemia; Postinflammatory pulmonary fibrosis; Other psoriasis; Rheumatoid arthritis(714.0); Chronic respiratory failure; Hypoxemia; Chronic airway obstruction, not elsewhere classified; Hyperlipidemia; Perforation of colon (10/2001); Internal hemorrhoids; Diverticulosis; Shortness of breath; Chronic right-sided HF (heart failure); Headache(784.0); Cancer (skin); History of DVT (deep vein thrombosis) - right leg; CAD (coronary artery disease), native coronary artery (2012); and CHF (congestive heart failure). Past Surgical History:   has past surgical history that includes Repair of cecal perforation (10/2001); Colon surgery (2003); Back surgery; Tonsillectomy; doppler echocardiography (June 2012); Persantine Myoview (June 2012); and Left and Right Heart Catheterization (2012). Social History:   reports that he quit smoking about 7 years ago. His smoking use included Cigarettes. He has a 84 pack-year smoking history. He has never used smokeless tobacco. He reports that he drinks about 0.6 oz of alcohol per week. He reports that he does not use illicit drugs.  [name] is a 63 y.o. male patient transferred from 2C05  Blood pressure 99/36, pulse 80, temperature 97.9 F (36.6 C), temperature source Oral, resp. rate 17, height _0  (1.727 m), weight 82.2 kg (181 lb 3.5 oz), SpO2 94 %.  IV Fluids:  IV in place, occlusive dsg intact without redness, IV cath forearm right, condition patent and no redness IV push only, no  IV fluids.   Skin: no skin issues  Will continue to evaluate and treat per MD orders.   Joslyn Hy, MSN, RN, [employer]

## 2014-03-22 NOTE — Progress Notes (Signed)
Patient still c/o of chest pain in left upper chest, rating at 5 on pain scale. Patient asking for stronger pain medication. Called PCCM. Awaiting return phone call to verbally update MD on patient status. Will continue to monitor patient status. [name]

## 2014-03-22 NOTE — Plan of Care (Signed)
Problem: Consults Goal: Diabetes Guidelines if Diabetic/Glucose > 140 If diabetic or lab glucose is > 140 mg/dl - Initiate Diabetes/Hyperglycemia Guidelines & Document Interventions  Outcome: Not Applicable Date Met:  63/84/53

## 2014-03-22 NOTE — Plan of Care (Signed)
Problem: ICU Phase Progression Outcomes Goal: Dyspnea controlled at rest Outcome: Progressing

## 2014-03-22 NOTE — Plan of Care (Signed)
Problem: ICU Phase Progression Outcomes Goal: O2 sats trending toward baseline Outcome: Progressing

## 2014-03-22 NOTE — Progress Notes (Signed)
[facility] Progress Note Patient Name: [name] DOB: 08-24-1950 MRN: [id number]   Date of Service  03/22/2014  HPI/Events of Note  K low now  eICU Interventions  Resume KCL -home med     Intervention Category Intermediate Interventions: Electrolyte abnormality - evaluation and management  ALVA,RAKESH V. 03/22/2014, 12:17 AM

## 2014-03-23 LAB — BASIC METABOLIC PANEL
Anion gap: 17 — ABNORMAL HIGH (ref 5–15)
BUN: 33 mg/dL — AB (ref 6–23)
CALCIUM: 8.2 mg/dL — AB (ref 8.4–10.5)
CO2: 31 mEq/L (ref 19–32)
Chloride: 91 mEq/L — ABNORMAL LOW (ref 96–112)
Creatinine, Ser: 1.29 mg/dL (ref 0.50–1.35)
GFR calc Af Amer: 66 mL/min — ABNORMAL LOW (ref >90)
GFR, EST NON AFRICAN AMERICAN: 57 mL/min — AB (ref >90)
GLUCOSE: 74 mg/dL (ref 70–99)
Potassium: 2.9 mEq/L — CL (ref 3.7–5.3)
SODIUM: 139 meq/L (ref 137–147)

## 2014-03-23 LAB — GLUCOSE, CAPILLARY: Glucose-Capillary: 116 mg/dL — ABNORMAL HIGH (ref 70–99)

## 2014-03-23 MED ORDER — POTASSIUM CHLORIDE CRYS ER 20 MEQ PO TBCR
40.0000 meq | EXTENDED_RELEASE_TABLET | Freq: Once | ORAL | Status: AC
  Administered 2014-03-23: 40 meq via ORAL

## 2014-03-23 MED ORDER — ZOLPIDEM TARTRATE 5 MG PO TABS: 5.0000 mg | ORAL_TABLET | Freq: Every evening | ORAL | Status: DC | PRN

## 2014-03-23 MED ORDER — FUROSEMIDE 10 MG/ML IJ SOLN
40.0000 mg | Freq: Two times a day (BID) | INTRAMUSCULAR | Status: DC
  Administered 2014-03-23 – 2014-03-24 (×3): 40 mg via INTRAVENOUS
  Filled 2014-03-23 (×2): qty 4

## 2014-03-23 MED ORDER — POTASSIUM CHLORIDE CRYS ER 20 MEQ PO TBCR
40.0000 meq | EXTENDED_RELEASE_TABLET | Freq: Three times a day (TID) | ORAL | Status: DC
  Administered 2014-03-23 – 2014-03-27 (×13): 40 meq via ORAL
  Filled 2014-03-23 (×14): qty 2

## 2014-03-23 NOTE — Progress Notes (Signed)
Medicare Important Message given?  YES (If response is "NO", the following Medicare IM given date fields will be blank) Date Medicare IM given:   Medicare IM given by:  Tomi Bamberger

## 2014-03-23 NOTE — Progress Notes (Signed)
Thank you for consulting the Palliative Medicine Team at [facility] to meet your patient's and family's needs.   The reason that you asked [place] to see your patient is  For Clarification of [place] and options  We have scheduled your patient for a meeting: 03-24-14 at 1000 am with Dr Rhea Pink   Other family members that need to be present: wife Roseanne Blankenhorn  Wadie Lessen NP  Palliative Medicine Team Team Phone # [phone] Pager [phone]

## 2014-03-23 NOTE — Progress Notes (Signed)
Name: [name] MRN: [id number] DOB: 1950/09/15    ADMISSION DATE:  03/21/2014   CHIEF COMPLAINT:  Progressive dyspnea and hypoxia   BRIEF PATIENT DESCRIPTION:  35 yom f/b Byrum for chronic resp failure due to severe secondary PAH in setting of underlying COPD and psoriatic arthritis related ILD. Maintained at home on: 19 liters of O2, letairis, tyvaso, BDs and diuretics. Admitted 12/2 w/ progressive acute on chronic resp failure.  .   SUBJECTIVE:  Slightly improved.  Still SOB at rest above baseline.   VITAL SIGNS: Temp:  [97.2 F (36.2 C)-97.9 F (36.6 C)] 97.8 F (36.6 C) (12/04 0647) Pulse Rate:  [61-87] 81 (12/04 0647) Resp:  [16-18] 18 (12/04 0647) BP: (94-113)/(36-58) 99/58 mmHg (12/04 0647) SpO2:  [87 %-94 %] 91 % (12/04 0647) Weight:  [80.5 kg (177 lb 7.5 oz)-82.2 kg (181 lb 3.5 oz)] 80.5 kg (177 lb 7.5 oz) (12/04 0700) 100%  PHYSICAL EXAMINATION: General:  Plethoric, cyanotic, no overt distress, on NRB Neuro:  No focal def  HEENT:  [place], + JVD  Cardiovascular:  II/VI SEM  Lungs:  Diffuse rales, no accessory muscle use  Abdomen:  Distended + bowel sounds Ext: digital clubbing, trace symmetric edema   I have reviewed all of today's lab results. Relevant abnormalities are discussed in the A/P section   Dg Chest Port 1 View  03/21/2014   CLINICAL DATA:  Worsening shortness of breath. Evaluate for pneumonia.  EXAM: PORTABLE CHEST - 1 VIEW  COMPARISON:  01/14/2013 and CT of 09/29/2010.  FINDINGS: Midline trachea. Moderate cardiomegaly. Pulmonary artery enlargement. Apparent right paratracheal soft tissue fullness may correspond to adenopathy on the remote CT. No definite pleural fluid. No pneumothorax. Interstitial lung disease which is moderate. No convincing evidence of acute superimposed process.  IMPRESSION: Interstitial lung disease, likely pulmonary fibrosis. No convincing evidence of acute superimposed process.  Cardiomegaly with suspicion of right mediastinal  adenopathy. Consider further evaluation with chest CT. Of note, adenopathy is present on the 09/29/2010 CT.  Pulmonary artery enlargement suggests pulmonary arterial hypertension.   Electronically Signed   By: Abigail Miyamoto M.D.   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  After 5:30 PM or weekends, call [phone]

## 2014-03-23 NOTE — Progress Notes (Signed)
CRITICAL VALUE ALERT  Critical value received:  Potassium 2.9  Date of notification:  03/23/14  Time of notification:  5427  Critical value read back:Yes.    Nurse who received alert:  Karena Addison  MD notified (1st page):  Nelda Marseille  Time of first page:  0745  MD notified (2nd page):  Time of second page:  Responding MD:  yacoub  Time MD responded: 8:39 AM MD stated aware

## 2014-03-24 DIAGNOSIS — J438 Other emphysema: Secondary | ICD-10-CM

## 2014-03-24 LAB — BASIC METABOLIC PANEL
ANION GAP: 14 (ref 5–15)
BUN: 28 mg/dL — ABNORMAL HIGH (ref 6–23)
CO2: 22 meq/L (ref 19–32)
CREATININE: 1.76 mg/dL — AB (ref 0.50–1.35)
Calcium: 8.7 mg/dL (ref 8.4–10.5)
Chloride: 98 mEq/L (ref 96–112)
GFR calc Af Amer: 45 mL/min — ABNORMAL LOW (ref >90)
GFR calc non Af Amer: 39 mL/min — ABNORMAL LOW (ref >90)
Glucose, Bld: 129 mg/dL — ABNORMAL HIGH (ref 70–99)
Potassium: 4 mEq/L (ref 3.7–5.3)
SODIUM: 134 meq/L — AB (ref 137–147)

## 2014-03-24 MED ORDER — FUROSEMIDE 10 MG/ML IJ SOLN
40.0000 mg | Freq: Every day | INTRAMUSCULAR | Status: DC
  Administered 2014-03-25 – 2014-03-26 (×2): 40 mg via INTRAVENOUS
  Filled 2014-03-24 (×2): qty 4

## 2014-03-24 NOTE — Progress Notes (Signed)
   Name: [name] MRN: [id number] DOB: 02-Aug-1950    ADMISSION DATE:  03/21/2014   CHIEF COMPLAINT:  Progressive dyspnea and hypoxia   BRIEF PATIENT DESCRIPTION:  76 yom f/b Byrum for chronic resp failure due to severe secondary PAH in setting of underlying COPD and psoriatic arthritis related ILD. Maintained at home on: 19 liters of O2, letairis, tyvaso, BDs and diuretics. Admitted 12/2 w/ progressive acute on chronic resp failure.  .   SUBJECTIVE:  Remains dyspneic even at rest.  Diuresing well.  Has meeting with palliative care today  VITAL SIGNS: Temp:  [97.5 F (36.4 C)] 97.5 F (36.4 C) (12/05 0526) Pulse Rate:  [78-84] 83 (12/05 1003) Resp:  [18] 18 (12/05 0526) BP: (90-119)/(53-81) 106/71 mmHg (12/05 1003) SpO2:  [86 %-96 %] 93 % (12/05 0922) FiO2 (%):  [100 %] 100 % (12/04 2049) 100%    PHYSICAL EXAMINATION: General:  Cyanotic, but not in signicant resp distress Neuro:  Alert, moves all 4 HEENT:  +JVD, no TMG or LN Cardiovascular: mild tachy, 2/6 sem Lungs:  Decreased bs, scattered crackles. Abdomen:  Protuberant, bs+ Ext: digital clubbing, mild edema   No results found.  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  To meet with palliative care today. -decrease lasix given worsening renal function and lower BP.   -continue other aggressive therapies.

## 2014-03-24 NOTE — Progress Notes (Signed)
Pt blood pressure 102/53, rechecked blood pressure 90/55. Paged MD, awaiting call back. Made Day RN aware, will follow-up.

## 2014-03-25 DIAGNOSIS — J849 Interstitial pulmonary disease, unspecified: Secondary | ICD-10-CM

## 2014-03-25 DIAGNOSIS — Z515 Encounter for palliative care: Secondary | ICD-10-CM

## 2014-03-25 LAB — BASIC METABOLIC PANEL
ANION GAP: 17 — AB (ref 5–15)
BUN: 30 mg/dL — ABNORMAL HIGH (ref 6–23)
CO2: 27 meq/L (ref 19–32)
Calcium: 8 mg/dL — ABNORMAL LOW (ref 8.4–10.5)
Chloride: 90 mEq/L — ABNORMAL LOW (ref 96–112)
Creatinine, Ser: 1.05 mg/dL (ref 0.50–1.35)
GFR calc Af Amer: 84 mL/min — ABNORMAL LOW (ref >90)
GFR calc non Af Amer: 73 mL/min — ABNORMAL LOW (ref >90)
GLUCOSE: 109 mg/dL — AB (ref 70–99)
Potassium: 3.6 mEq/L — ABNORMAL LOW (ref 3.7–5.3)
SODIUM: 134 meq/L — AB (ref 137–147)

## 2014-03-25 MED ORDER — FENTANYL 12 MCG/HR TD PT72
12.5000 ug | MEDICATED_PATCH | TRANSDERMAL | Status: DC
  Administered 2014-03-25: 12.5 ug via TRANSDERMAL
  Filled 2014-03-25: qty 1

## 2014-03-25 MED ORDER — CALCIUM CARBONATE ANTACID 500 MG PO CHEW
1.0000 | CHEWABLE_TABLET | Freq: Three times a day (TID) | ORAL | Status: DC | PRN
  Administered 2014-03-25: 200 mg via ORAL
  Filled 2014-03-25 (×2): qty 1

## 2014-03-25 MED ORDER — ALPRAZOLAM 0.5 MG PO TABS
0.5000 mg | ORAL_TABLET | Freq: Three times a day (TID) | ORAL | Status: DC | PRN
  Administered 2014-03-25 – 2014-03-27 (×2): 0.5 mg via ORAL
  Filled 2014-03-25 (×2): qty 1

## 2014-03-25 NOTE — Progress Notes (Signed)
Name: [name] MRN: [id number] DOB: 07-07-1950    ADMISSION DATE:  03/21/2014   CHIEF COMPLAINT:  Progressive dyspnea and hypoxia   BRIEF PATIENT DESCRIPTION:  1 yom f/b Byrum for chronic resp failure due to severe secondary PAH in setting of underlying COPD and psoriatic arthritis related ILD. Maintained at home on: 19 liters of O2, letairis, tyvaso, BDs and diuretics. Admitted 12/2 w/ progressive acute on chronic resp failure.  .   SUBJECTIVE:  Remains dyspneic even at rest, but wob is not excessive.   AL SIGNS: Temp:  [97.4 F (36.3 C)] 97.4 F (36.3 C) (12/05 2126) Pulse Rate:  [74-80] 80 (12/06 0541) Resp:  [18-20] 20 (12/06 0541) BP: (94-116)/(49-75) 115/54 mmHg (12/06 0543) SpO2:  [90 %-97 %] 94 % (12/06 0541) FiO2 (%):  [100 %] 100 % (12/05 2056) 100%    PHYSICAL EXAMINATION: General:  Cyanotic, but not in signicant resp distress Neuro:  Alert, moves all 4 HEENT:  +JVD, no TMG or LN Cardiovascular: mild tachy, 2/6 sem Lungs:  Decreased bs, basilar crackles. Abdomen:  Protuberant, bs+ Ext: digital clubbing, mild edema   No results found.  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  Was supposed to meet with palliative care, but no note currently -continue to monitor BMET.  Renal function better since diuretic dose decreased. -continue other aggressive therapies for now.

## 2014-03-25 NOTE — Consult Note (Addendum)
Palliative Medicine Team Consult Note  Met with Mr. [name] and his wife-he has end stage ILD with severe PAH. His O2 requirements at home have been as high as 19L- this is becoming challenging to manage and his dyspnea has become increasingly debilitating. He knows he is nearing EOL "just a matter of time". He describes severe panic from the air hunger and tells me "I just cant get enough O2". He has never used opiates or anxiolytics for his dyspnea-he has noticed that when he takes hydrocodone for gout flares that it helps with his breathing. He has been tolerating PRN IV fentanyl very well during this hospitalization.He has severe peripheral neuropathy. His functional status is quite limited he can ambulate very short distances. His wife works and he is home most of the day alone -he has everything very close including his bathroom and has been able to be independent.  He understands the severity of his disease and is open to Hospice care- he wants to be at home not in the hospital-he comes into the hospital because he cant manage his dyspnea and he tells me they wont deliver anymore oxygen than what he is already getting. He is on Tyvaso and Letaris which may greatly complicate his Hospice eligibility unfortunately. Will need to see if we can figure out how to get him hospice care despite these extremely expensive medications. Abrupt withdrawal of Tyvaso and Letaris can lead to significant symptom burden (dyspnea, anxiety, nausea, light-headedness, chest and abdominal pain) and rapid death that would mimic a terminal wean. [facility] has a set of guidelines for tapering the prostacyclins but it needs to be done under physician supervision. I discussed this issue openly.  Recommendations:  1. DNR 2. Explore Hospice options with [facility] 3. Start Duragesic 12.5 and prn alprazolam for air hunger 4. His wife has caregiver burnout- she needs support.  Will follow up tomorrow regarding Hospice  Options and see if CM can help.  Time: 4PM-5:10 (70 minutes) Lane Hacker, DO Palliative Medicine [phone]

## 2014-03-26 LAB — BASIC METABOLIC PANEL
ANION GAP: 12 (ref 5–15)
BUN: 27 mg/dL — ABNORMAL HIGH (ref 6–23)
CHLORIDE: 91 meq/L — AB (ref 96–112)
CO2: 28 meq/L (ref 19–32)
Calcium: 8.2 mg/dL — ABNORMAL LOW (ref 8.4–10.5)
Creatinine, Ser: 0.93 mg/dL (ref 0.50–1.35)
GFR calc Af Amer: >90 mL/min (ref >90)
GFR calc non Af Amer: 86 mL/min — ABNORMAL LOW (ref >90)
Glucose, Bld: 153 mg/dL — ABNORMAL HIGH (ref 70–99)
Potassium: 4.4 mEq/L (ref 3.7–5.3)
SODIUM: 131 meq/L — AB (ref 137–147)

## 2014-03-26 MED ORDER — FUROSEMIDE 40 MG PO TABS
60.0000 mg | ORAL_TABLET | Freq: Two times a day (BID) | ORAL | Status: DC
  Administered 2014-03-26 – 2014-03-27 (×2): 60 mg via ORAL
  Filled 2014-03-26 (×4): qty 1

## 2014-03-26 NOTE — Progress Notes (Signed)
   Name: [name] MRN: [id number] DOB: 11-12-1950    ADMISSION DATE:  03/21/2014   CHIEF COMPLAINT:  Progressive dyspnea and hypoxia   BRIEF PATIENT DESCRIPTION:  48 yom f/b Ramone Gander for chronic resp failure due to severe secondary PAH in setting of underlying COPD and psoriatic arthritis related ILD. Maintained at home on: 19 liters of O2, letairis, tyvaso, BDs and diuretics. Admitted 12/2 w/ progressive acute on chronic resp failure.    SUBJECTIVE: Pt indicates his diet as been too restricted, wants a regular diet.     VITAL SIGNS: Temp:  [97.6 F (36.4 C)-97.7 F (36.5 C)] 97.6 F (36.4 C) (12/07 0540) Pulse Rate:  [78-80] 79 (12/07 0540) Resp:  [15-20] 15 (12/07 0540) BP: (95-135)/(56-72) 99/56 mmHg (12/07 0540) SpO2:  [92 %-95 %] 93 % (12/07 0540) On 15 L   PHYSICAL EXAMINATION: General:  Cyanotic, but not in signicant resp distress Neuro:  Alert, moves all 4 HEENT:  +JVD, no TMG or LN Cardiovascular: mild tachy, 2/6 sem Lungs:  Decreased bs, basilar crackles. Abdomen:  Protuberant, bs+ Ext: digital clubbing, trace edema    Recent Labs Lab 03/21/14 1515 03/22/14 0535  HGB 18.3* 18.8*  HCT 56.2* 56.9*  WBC 11.7* 12.9*  PLT 123* 111*    Recent Labs Lab 03/21/14 1515  03/22/14 0535 03/23/14 0545 03/24/14 0628 03/25/14 0620 03/26/14 0556  NA 141  --  139 139 134* 134* 131*  K 4.9  < > 3.8 2.9* 4.0 3.6* 4.4  CL 95*  --  92* 91* 98 90* 91*  CO2 30  --  _0 GLUCOSE 133*  --  82 74 129* 109* 153*  BUN 29*  --  28* 33* 28* 30* 27*  CREATININE 1.19  --  1.19 1.29 1.76* 1.05 0.93  CALCIUM 9.3  --  9.1 8.2* 8.7 8.0* 8.2*  MG 1.7  --   --   --   --   --   --   < > = values in this interval not displayed.  ASSESSMENT / PLAN:  Acute on Chronic Respiratory Failure -the pt has endstage disease, and remains dyspneic despite maximal therapy and diuresis.   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  Renal function better since diuretic dose decreased. Will attempt to push dose up slightly to get him net negative, currently -319cc for the hospitalization.  Discharge planning > likely home with palliative care support once stable Has two O2 concentrators at home  Noe Gens, NP-C [place] Pgr: [phone] or [phone]   Attending Note:  I have examined patient, reviewed the labs, notes, studies. I have discussed the case with B Ollis and I agree with the data and plans as amended above. He has end stage secondary PAH, decompensated. Will be difficult or impossible to get back to a compensated state. I support involvement of hospice, note that there may be barriers to getting Tyvaso, Letaris due to cost - both meds that directly impact his dyspnea. Will discuss this w Dr Hilma Favors, see what our options are.   Baltazar Apo, MD, PhD 03/26/2014, 4:01 PM [facility] [phone] or if no answer [phone]

## 2014-03-26 NOTE — Progress Notes (Signed)
Medicare Important Message given?  YES (If response is "NO", the following Medicare IM given date fields will be blank) Date Medicare IM given:  03/26/14 Medicare IM given by:  Tomi Bamberger

## 2014-03-27 MED ORDER — METOLAZONE 5 MG PO TABS: 5.0000 mg | ORAL_TABLET | Freq: Every day | ORAL | Status: AC

## 2014-03-27 MED ORDER — FUROSEMIDE 20 MG PO TABS: 60.0000 mg | ORAL_TABLET | Freq: Two times a day (BID) | ORAL | Status: AC

## 2014-03-27 MED ORDER — ALPRAZOLAM 0.5 MG PO TABS: 0.5000 mg | ORAL_TABLET | Freq: Three times a day (TID) | ORAL | Status: DC | PRN

## 2014-03-27 MED ORDER — FENTANYL 12 MCG/HR TD PT72: 12.5000 ug | MEDICATED_PATCH | TRANSDERMAL | Status: DC

## 2014-03-27 NOTE — Discharge Summary (Signed)
Physician Discharge Summary  Patient ID: MILLAN LEGAN MRN: [id number] DOB/AGE: [birth year] 63 y.o.  Admit date: 03/21/2014 Discharge date: 03/27/2014    Discharge Diagnoses:  Acute on Chronic Respiratory Failure  Decompensated Severe PAH Refractory Hypoxemia  Secondary Polycythemia Cor Pulmonale Severe End-Stage COPD Pulmonary Fibrosis secondary to Psoriatic Arthritis  Chronic LE DVT Diabetes Mellitus                                                                        DISCHARGE PLAN BY DIAGNOSIS     Acute on Chronic Respiratory Failure  Decompensated Severe PAH Refractory Hypoxemia  Secondary Polycythemia Cor Pulmonale Severe End-Stage COPD Pulmonary Fibrosis secondary to Psoriatic Arthritis   Discharge Plan: -Discharge with [facility] following.  They have agreed to cover Tyvaso / Letaris.  At this point, this is a palliative medication and should not be stopped abruptly under any circumstance.   -Duragesic patch 12.5 mg Q 72 hours -PRN xanax for air hunger -O2 via two concentrators at home for 19L O2 -Continue lasix 60 mg BID + 70m QD Metolazone -Spiriva + Q6 Albuterol neb -Prednisone 20 mg QD  -Follow up with TRexene Edison NP in pulmonary office as below -Consider follow up BMP at time of visit to review renal function with diuretic regimen adjustment.   Chronic LE DVT  Discharge Plan: -continue xarelto   Diabetes Mellitus   Discharge Plan: -Continue amaryl                  DISCHARGE SUMMARY   RKERMITT HARJOis a 63y.o. y/o male with a PMH of HLD, Cor Pulmonale, CAD, COPD, Psoriatic Arthritis, ILD and severe secondary PAH who at baseline is on 19L O2, letairis, tyvaso, bronchodilators, prednisone (65m and diuretics.  The patient called the pulmonary office on 12/2 with increased shortness of breath and chest tightness.  He was directly admitted to SDU.  The patient reported 1 week history of progressive dyspnea, chills,fatigue, anorexia,  abdominal distention and desaturations.  He was admitted for progressive acute on chronic respiratory failure in the setting of decompensated severe PAH.  He was aggressively diuresed with improvement in symptoms.  They patient was maintained on baseline medications of Letaris & Tyvaso.  Code status was clarified on admission and he requested DNR. Initial chest xray was negative for acute infiltrate / process.  [facility] and started on duragesic & xanax for dyspnea control.  Diuretic regimen was adjusted due to acute renal insufficiency while inpatient (see meds below).  [facility] baseline medications & assist with home care.  These medications should not be stopped abruptly.  He made slow improvement and was cleared for discharge on 12/8 with plans as above.               SIGNIFICANT DIAGNOSTIC STUDIES 10/04/09 >> PAP 57/26 mean 37, PAOP 14/11 mean 7 by cath 12/02  CXR >> ILD, no acute superimposed process   CONSULTS Hospice & Palliative Care    Discharge Exam: General: chronically ill in NAD, cyanotic at baseline Neuro: AAOx4 CV: s1s2 rrr, 2/6 SEM PULM: diminished breath sounds bilaterally, basilar crackles  GI: protuberant,  BSx4 active  Extremities: warm/dry, digital clubbing, trace edema   Filed Vitals:   03/26/14 1456 03/26/14 2032 03/26/14 2049 03/27/14 0631  BP: 162/72  110/60 125/80  Pulse: 86  74 75  Temp: 98.4 F (36.9 C)  97.5 F (36.4 C) 97.4 F (36.3 C)  TempSrc: Oral  Oral Oral  Resp: _0 Height:      Weight:      SpO2: 91% 91% 90% 92%     Discharge Labs  BMET  Recent Labs Lab 03/21/14 1515  03/22/14 0535 03/23/14 0545 03/24/14 0628 03/25/14 0620 03/26/14 0556  NA 141  --  139 139 134* 134* 131*  K 4.9  < > 3.8 2.9* 4.0 3.6* 4.4  CL 95*  --  92* 91* 98 90* 91*  CO2 30  --  _1 GLUCOSE 133*  --  82 74 129* 109* 153*  BUN 29*  --  28* 33* 28*  30* 27*  CREATININE 1.19  --  1.19 1.29 1.76* 1.05 0.93  CALCIUM 9.3  --  9.1 8.2* 8.7 8.0* 8.2*  MG 1.7  --   --   --   --   --   --   < > = values in this interval not displayed.  CBC  Recent Labs Lab 03/21/14 1515 03/22/14 0535  HGB 18.3* 18.8*  HCT 56.2* 56.9*  WBC 11.7* 12.9*  PLT 123* 111*        Follow-up Information    Follow [facility].   Why:  Hospice at home   Contact information:   [facility]      Follow up with PARRETT,TAMMY, NP On 04/02/2014.   Specialty:  Nurse Practitioner   Why:  Appt at 3:15 PM   Contact information:   [address] [address] [phone]          Medication List    TAKE these medications        albuterol (2.5 MG/3ML) 0.083% nebulizer solution  Commonly known as:  PROVENTIL  Take 3 mLs (2.5 mg total) by nebulization every 6 (six) hours as needed for wheezing.     ALPRAZolam 0.5 MG tablet  Commonly known as:  XANAX  Take 1 tablet (0.5 mg total) by mouth 3 (three) times daily as needed for anxiety or sleep (air hunger).     b complex vitamins tablet  Take 1 tablet by mouth daily.     calcium-vitamin D 250-125 MG-UNIT per tablet  Commonly known as:  OSCAL  Take 1 tablet by mouth 2 (two) times daily.     cetirizine 10 MG tablet  Commonly known as:  ZYRTEC  Take 10 mg by mouth daily.     fentaNYL 12 MCG/HR  Commonly known as:  DURAGESIC - dosed mcg/hr  Place 1 patch (12.5 mcg total) onto the skin every 3 (three) days.     fluticasone 50 MCG/ACT nasal spray  Commonly known as:  FLONASE  Place 2 sprays into the nose 2 (two) times daily.     folic acid 1 MG tablet  Commonly known as:  FOLVITE  Take 1 mg by mouth daily.     furosemide 20 MG tablet  Commonly known as:  LASIX  Take 3 tablets (60 mg total) by mouth 2 (two) times daily.     glimepiride 2 MG tablet  Commonly known  as:  AMARYL  Take 1 tablet (2 mg total) by  mouth daily before breakfast.     HUMIRA 40 MG/0.8ML injection  Generic drug:  adalimumab  Inject 40 mg into the skin every 14 (fourteen) days.     LETAIRIS 10 MG tablet  Generic drug:  ambrisentan  TAKE 1 TABLET (10 MG) ORALLY DAILY. DO NOT HANDLE IF PREGNANT. DO NOTSPLIT, CRUSH OR CHEW. AVOID INHALATION AND CONTACT WITH SKIN OR EYE.     metolazone 5 MG tablet  Commonly known as:  ZAROXOLYN  Take 5 mg by mouth 2 (two) times a week. Take on Mondays and Fridays     metolazone 5 MG tablet  Commonly known as:  ZAROXOLYN  Take 1 tablet (5 mg total) by mouth daily.     multivitamin capsule  Take 1 capsule by mouth daily.     OXYGEN  8+ Liters continuous inhalation     pantoprazole 40 MG tablet  Commonly known as:  PROTONIX  Take 40 mg by mouth daily.     potassium chloride SA 20 MEQ tablet  Commonly known as:  K-DUR,KLOR-CON  Take 80 mEq by mouth 2 (two) times daily.     predniSONE 20 MG tablet  Commonly known as:  DELTASONE  Take 1 tablet (20 mg total) by mouth daily with breakfast.     simvastatin 20 MG tablet  Commonly known as:  ZOCOR  TAKE 1 TABLET DAILY AT BEDTIME.     tiotropium 18 MCG inhalation capsule  Commonly known as:  SPIRIVA HANDIHALER  PLACE 1 CAPSULE INTO INHALER AND INHALE INTO THE LUNGS ONCE DAILY     TYLENOL ARTHRITIS PAIN 650 MG CR tablet  Generic drug:  acetaminophen  Take 1,300 mg by mouth 2 (two) times daily.     TYVASO IN  Inhale 4 puffs into the lungs 4 (four) times daily.     ULORIC 40 MG tablet  Generic drug:  febuxostat  Take 1 tablet by mouth daily.     XARELTO 20 MG Tabs tablet  Generic drug:  rivaroxaban  Take 20 mg by mouth daily.          Disposition: Home with Hospice.    Discharged Condition: KNOLAN SIMIEN has met maximum benefit of inpatient care and is medically stable and cleared for discharge.  Patient is pending follow up as above.      Time spent on disposition:  Greater than 35 minutes.   Signed: Noe Gens, NP-C [facility] Pgr: [phone] Office: [phone]    Baltazar Apo, MD, PhD 03/27/2014, 3:17 PM [facility] [phone] or if no answer [phone]

## 2014-03-27 NOTE — Progress Notes (Addendum)
Palliative Care Team at [facility] Note   SUBJECTIVE: Tolerating Fenantyl patch very well. Close to baseline.  OBJECTIVE: Vital Signs: BP 125/80 mmHg  Pulse 75  Temp(Src) 97.4 F (36.3 C) (Oral)  Resp 22  Ht _0  (1.727 m)  Wt 80.5 kg (177 lb 7.5 oz)  BMI 26.99 kg/m2  SpO2 92%   Intake and Output: 12/07 0701 - 12/08 0700 In: 1480 [P.O.:1480] Out: 1000 [Urine:1000]  Physical Exam: General: Alert, cooperative, full face mask at baseline  Head: Normal-cyanosis  Lungs:  Diffuse crackles  Heart: normal  Abdomen:  Slightly distended, non-tender  Extremities: +edema, +clubbing    No Known Allergies  Medications: Scheduled Meds:  . ambrisentan  10 mg Oral Daily  . arformoterol  15 mcg Nebulization BID  . budesonide (PULMICORT) nebulizer solution  0.5 mg Nebulization BID  . febuxostat  40 mg Oral Daily  . fentaNYL  12.5 mcg Transdermal Q72H  . fluticasone  2 spray Each Nare Daily  . folic acid  1 mg Oral Daily  . furosemide  60 mg Oral BID  . glimepiride  2 mg Oral QAC breakfast  . metolazone  5 mg Oral Daily  . pantoprazole  40 mg Oral Daily  . potassium chloride SA  40 mEq Oral TID  . predniSONE  20 mg Oral Q breakfast  . rivaroxaban  20 mg Oral Q supper  . sodium chloride  3 mL Intravenous Q12H  . tiotropium  18 mcg Inhalation Daily  . Treprostinil  18 mcg Inhalation 4 times per day    Continuous Infusions:    PRN Meds: sodium chloride, acetaminophen, albuterol, ALPRAZolam, calcium carbonate, fentaNYL, sodium chloride  Stool Softner: yes  Palliative Performance Scale: 30 %    Labs: CBC    Component Value Date/Time   WBC 12.9* 03/22/2014 0535   WBC 12.7* 11/13/2009 1034   RBC 5.80 03/22/2014 0535   RBC 5.03 11/13/2009 1034   HGB 18.8* 03/22/2014 0535   HGB 15.7 11/13/2009 1034   HCT 56.9* 03/22/2014 0535   HCT 46.4 11/13/2009 1034   PLT 111* 03/22/2014 0535   PLT 167 11/13/2009 1034   MCV 98.1 03/22/2014 0535   MCV 92.3 11/13/2009  1034   MCH 32.4 03/22/2014 0535   MCH 31.3 11/13/2009 1034   MCHC 33.0 03/22/2014 0535   MCHC 33.9 11/13/2009 1034   RDW 15.8* 03/22/2014 0535   RDW 15.8* 11/13/2009 1034   LYMPHSABS 0.7 03/21/2014 1515   LYMPHSABS 1.3 11/13/2009 1034   MONOABS 0.4 03/21/2014 1515   MONOABS 0.6 11/13/2009 1034   EOSABS 0.0 03/21/2014 1515   EOSABS 0.7* 11/13/2009 1034   BASOSABS 0.0 03/21/2014 1515   BASOSABS 0.0 11/13/2009 1034    CMET     Component Value Date/Time   NA 131* 03/26/2014 0556   K 4.4 03/26/2014 0556   CL 91* 03/26/2014 0556   CO2 28 03/26/2014 0556   GLUCOSE 153* 03/26/2014 0556   BUN 27* 03/26/2014 0556   CREATININE 0.93 03/26/2014 0556   CALCIUM 8.2* 03/26/2014 0556   PROT 6.6 03/21/2014 1515   ALBUMIN 3.7 03/21/2014 1515   AST 39* 03/21/2014 1515   ALT 55* 03/21/2014 1515   ALKPHOS 80 03/21/2014 1515   BILITOT 0.9 03/21/2014 1515   GFRNONAA 86* 03/26/2014 0556   GFRAA >90 03/26/2014 0556     ASSESSMENT/ PLAN: 63 yo with end stage Interstitial Lung disease with Pulmonary Arterial Hypertension-severe. On 19L (2 concentrators) at home at baseline. Still  ambulatory, PO intake minimal.   1. Pain: Continue Duragesic Patch at 12.58mg q72 hours 2. Dyspnea: Maintain Tyveso and Levataris for SYMPTOM MANAGEMENT. Please document in discharge summary that these medications must not be abruptly discontinued or stopped and they are directly related to management of his symptoms and dyspnea. 3. Disposition: [facility] agreed to accept this patient and follow at home.   Prognosis: <3 months (probably much less)  Maintain all PAH medications.  ELane Hacker DO Palliative Medicine [phone]  35 minutes  Greater than 50%  of this time was spent counseling and coordinating care related to the above assessment and plan.   EAcquanetta Chain DO  03/27/2014, 10:26 AM  Please contact Palliative Medicine Team phone at [phone] questions and  concerns.

## 2014-03-27 NOTE — Progress Notes (Signed)
Patient was discharged home by MD order; discharged instructions review and give to patient with care notes and prescriptions; IV DIC; skin intact; patient will be escorted to the car by nurse tech via wheelchair.  

## 2014-03-27 NOTE — Care Management Note (Signed)
    Page 1 of 1   03/27/2014     3:08:44 PM CARE MANAGEMENT NOTE 03/27/2014  Patient:  [name], [name]   Account Number:  [id number]  Date Initiated:  03/21/2014  Documentation initiated by:  MAYO,HENRIETTA  Subjective/Objective Assessment:   dx acute on chronic resp failure; lives with spouse, has home O2 through [facility]    PCP  Deland Pretty     Action/Plan:   Anticipated DC Date:  03/27/2014   Anticipated DC Plan:  [place]  CM consult      PAC Choice  HOSPICE   Choice offered to / List presented to:  C-1 Patient           Status of service:  Completed, signed off Medicare Important Message given?  YES (If response is "NO", the following Medicare IM given date fields will be blank) Date Medicare IM given:  03/27/2014 Medicare IM given by:  Tomi Bamberger Date Additional Medicare IM given:   Additional Medicare IM given by:    Discharge Disposition:  [place]  Per UR Regulation:  Reviewed for med. necessity/level of care/duration of stay  If discussed at Long Length of Stay Meetings, dates discussed:    Comments:  03/27/14 [place], BSN [phone] NCM spoke with Dr. Hilma Favors, patient is on two expensive medications Tyvasco and Letaris, patient has decided to go home with hospice.  No Hospice would take patient due to the expensive meds he is on, patient is usually on 19 liters of home oxygen but here in hospital is on 15 liters. NCM informed patient of this situation and explained to him that NCM will be faxing out to [place] in [facility], he agreed.  NCM received a call from [facility] with [facility] and Hospice and states he will speak with his director and get back with me.  Cory called the NCM this am and stated they can take patient as long as the meds are for plalliative care and symptom management. NCM informed Dr. Hilma Favors of this information.  NCM informed patient that  [facility] would be able to take him as a patient and Tommi Rumps who is a rep is here to speak with him.  NCM informed Rose , patient 's wife that plan is for dc today home with Hospice and that she will need to contact [facility] as soon as they leave the hospital so that a [facility] and aide will be there to meet them.  NCM gave her the card with the phone number. Rose also brought patient's tanks with her for patient to travel with.

## 2014-03-28 ENCOUNTER — Telehealth: Payer: Self-pay | Admitting: Emergency Medicine

## 2014-03-28 NOTE — Telephone Encounter (Signed)
Pt's wife states the patient needs Rx's sent to pharmacy for Lasix 20 mg take 3 tablets BID and his K+ Rx. Wife states the dose was changed in the hospital. Wife aware that I am sending message to [place] to advise on refills.    RB please advise as patient will be out soon and needs this today. Thanks.

## 2014-03-28 NOTE — Telephone Encounter (Signed)
Called and spoke with pts wife and she stated that the pt jumped the gun on his refills and the pharmacy had already refilled these meds.  No refills needed at this time but she will call back if anything further is needed.

## 2014-03-28 NOTE — Telephone Encounter (Signed)
Yes please change him to 60 mg by mouth twice a day Also please note that his Zaroxolyn was changed to daily. Make sure he has enough of this medication because a new prescription may need to be sent

## 2014-03-29 ENCOUNTER — Telehealth: Payer: Self-pay | Admitting: Emergency Medicine

## 2014-03-29 NOTE — Telephone Encounter (Signed)
Called and spoke to pt. Pt is requesting a refill of Xarelto. Per pt's chart we have not filled this medication before. Called and spoke to pt's pharmacy and was informed that Dr. Shelia Media has been filling the medication and was last filled on 03/12/14. Called pt back and informed him he will need to have Dr. Shelia Media continue filling med because he is the MD that has been filling it and Dr. Lamonte Sakai has never filled it. Pt verbalized understanding and stated he would call Dr. Pennie Banter office for refill. Pt denied any further questions or concerns at this time.

## 2014-04-02 ENCOUNTER — Inpatient Hospital Stay: Payer: BC Managed Care – PPO | Admitting: Adult Health

## 2014-04-03 ENCOUNTER — Encounter: Payer: Self-pay | Admitting: Adult Health

## 2014-04-03 ENCOUNTER — Telehealth: Payer: Self-pay | Admitting: Emergency Medicine

## 2014-04-03 ENCOUNTER — Other Ambulatory Visit (INDEPENDENT_AMBULATORY_CARE_PROVIDER_SITE_OTHER): Payer: BC Managed Care – PPO

## 2014-04-03 ENCOUNTER — Ambulatory Visit (INDEPENDENT_AMBULATORY_CARE_PROVIDER_SITE_OTHER): Payer: BC Managed Care – PPO | Admitting: Adult Health

## 2014-04-03 VITALS — BP 112/64 | HR 85 | Temp 97.3°F

## 2014-04-03 DIAGNOSIS — I272 Pulmonary hypertension, unspecified: Secondary | ICD-10-CM

## 2014-04-03 DIAGNOSIS — J8410 Pulmonary fibrosis, unspecified: Secondary | ICD-10-CM

## 2014-04-03 DIAGNOSIS — I270 Primary pulmonary hypertension: Secondary | ICD-10-CM

## 2014-04-03 DIAGNOSIS — I2781 Cor pulmonale (chronic): Secondary | ICD-10-CM

## 2014-04-03 LAB — BASIC METABOLIC PANEL
BUN: 39 mg/dL — ABNORMAL HIGH (ref 6–23)
CHLORIDE: 88 meq/L — AB (ref 96–112)
CO2: 40 meq/L — AB (ref 19–32)
CREATININE: 1.4 mg/dL (ref 0.4–1.5)
Calcium: 8.8 mg/dL (ref 8.4–10.5)
GFR: 52.23 mL/min — ABNORMAL LOW (ref >60.00)
Glucose, Bld: 163 mg/dL — ABNORMAL HIGH (ref 70–99)
POTASSIUM: 3.7 meq/L (ref 3.5–5.1)
Sodium: 136 mEq/L (ref 135–145)

## 2014-04-03 MED ORDER — ALPRAZOLAM 0.5 MG PO TABS: 0.5000 mg | ORAL_TABLET | Freq: Three times a day (TID) | ORAL | Status: AC | PRN

## 2014-04-03 MED ORDER — FENTANYL 12 MCG/HR TD PT72: 12.5000 ug | MEDICATED_PATCH | TRANSDERMAL | Status: DC

## 2014-04-03 NOTE — Patient Instructions (Signed)
Decrease Metalazone 36m to every other day  Labs today  Continue your oxygen at all times Continue with all labs Follow with Dr BLamonte Sakaiin 3 months and As needed

## 2014-04-03 NOTE — Telephone Encounter (Signed)
Per RB - should be 12 puffs QID Will call [facility] tomorrow to give verbally

## 2014-04-03 NOTE — Telephone Encounter (Signed)
Sending this message to Janett Billow per her request. Pt is seeing TP today in the office.

## 2014-04-04 NOTE — Assessment & Plan Note (Signed)
Cont on O2  End stage -cont w/ hospice

## 2014-04-04 NOTE — Progress Notes (Signed)
Subjective:    Patient ID: RUVIM RISKO, male    DOB: 03-07-1951, 63 y.o.   MRN: [id number] HPI 63 yo man, former smoker with COPD, hx psoriatic arthritis and associated ILD, profound hypoxemia and associated Pulm HTN (PAP 57/26 mean 37, PAOP 14/11 mean 7 by cath 10/04/09. 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 Admitted 6/21-27 with resp failure and decompensated R heart failure. He was diuresed, treated with corticosteroids and started on bronchodilators for his COPD. O2 was titrated to 5L/min at rest, he is leaving it on 5 with exertion.    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 He has severe chronic hypoxemia. He is on lasix but unsure of the dosing, tyvaso 12 puffs, letaris 10, pred 20, humira. He uses metolazone usually Mon and Fri, adjusts lasix based on wt.   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 He has severe chronic hypoxemia. He finally got his letaris approved. He is having more exertional sob and hypoxemia over the last several weeks. His wt fluctuates, he drops on Mon and Friday when he takes metolazone. Wt 183 - 187.  Tyvaso 12 puffs qid. He has been on metolazone tiw in the past, but had a hard time tolerating. Minimal sputum, no cough, no wheeze. More anxious - scared to sleep, scared to take a shower worried that something bad will happen.   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 He has severe chronic hypoxemia. He has been on tyvaso, letaris, diuretic regimen. At [facility] they added Revatio as opposed to start IV therapy. He had an increase in edema, stopped it soon after. Chronic pred for auto-immune disease. Alternates between oximizer, 1.00 NRB mask. Suspect that he will not be a good candidate for IV therapy if he hasn't responded to Revatio.    Need to change the orders from [place] re: his O2 orders    ROV 12/15/13 -- chronic resp failure with severe PAh and hypoxemia, auto-immune ILD (psoriatic), COPD. Currently on tyvaso, letaris, diuretic regimen, chronic pred 20. He was on sildenifil > stopped. He gained wt on the revatio, is now back down to 184 lbs. He feels stable. No AE's. He has been dealing w gout in [place].   ROV 03/13/14 -- follow-up visit chronic resp failure with severe PAh and hypoxemia, auto-immune ILD (psoriatic), COPD.  His wt has been stable at 181-182. He has been progressively SOB over the last 2 months. He desaturates easily, has been having trouble with [place] [facility] regarding his O2 needs  [facility] follow up 04/03/14  Patient returns for a follow-up visit for his chronic respiratory failure with severe PAH and hypoxemia, autoimmune ILD (Psoriatic) and COPD Recently admitted last week for acute on chronic hypoxemic respiratory failure due to severe memory artery hypertension and cor pulmonale. Patient was seen by palliative care and was discharged on community hospice. He was started on Duragesic patches and Xanax. He was treated with diuresis high flow O2. Patient says since discharge. He is holding his own.  Is chronically short of breath at rest on high flow O2. Wife is his primary caregiver, FMLA paperwork was completed for her. Prior to admission was on metolazone 2 times a week but was discharged on daily. Does feel very dried out in week Patient had renal  insufficiency during his hospitalization. He denies any leg swelling, or increased orthopnea He denies any hemoptysis, fever, chest pain, or nausea, vomiting, diarrhea  ROS  Reviewed , see HPI   Objective:   Physical Exam  Gen: Pleasant, hyperpigmented face, no distress, chronically ill appearing on high flow O2 via mask   ENT: No lesions,  mouth clear,  oropharynx clear, no postnasal drip, throat clearing. Nasal  mucosa red and swollen bilaterally  Neck: No JVD, no TMG, no carotid bruits  Lungs: No use of accessory muscles, Bibasilar insp crackles. No wheeze  Cardiovascular: RRR, heart sounds normal, no murmur or gallops, trace pretibial edema  Musculoskeletal: No deformities, no cyanosis or clubbing  Neuro: alert, non focal  Skin: Warm, no lesions or rashes, hyperpigmented face psoriatic patches along arms and legs    Assessment & Plan:

## 2014-04-04 NOTE — Assessment & Plan Note (Signed)
Recent decompensation now improved  Check bmet  Plan  Decrease Metalazone 103m to every other day  Labs today  Continue your oxygen at all times Continue with all labs Follow with Dr BLamonte Sakaiin 3 months and As needed

## 2014-04-04 NOTE — Assessment & Plan Note (Signed)
Continue on his chronic regimen with Tyvaso and LEtaris , O2 , lasix and metalazone  Will adjust diuretics to avoid worseing renal insufficiency and over diuresis

## 2014-04-04 NOTE — Telephone Encounter (Signed)
Called [facility] and spoke with pharmacist Chrissy Per Chrissy, unable to receive verbally as this requires signature - form had been faxed on 12.14.15 per their system Asked Chrissy to please fax again to triage, my attn as RB will be back in the office tomorrow - was assured this will be done  Will hold to await fax

## 2014-04-04 NOTE — Addendum Note (Signed)
Addended by: Parke Poisson E on: 04/04/2014 02:31 PM   Modules accepted: Orders, Medications

## 2014-04-05 NOTE — Telephone Encounter (Signed)
[facility] wanting to confirm if we received their fax. [phone]

## 2014-04-05 NOTE — Telephone Encounter (Signed)
Fax was received and given to RB to sign  Spoke with Lissette and notified that this was done  Nothing further needed

## 2014-04-06 ENCOUNTER — Telehealth: Payer: Self-pay | Admitting: Emergency Medicine

## 2014-04-06 ENCOUNTER — Other Ambulatory Visit: Payer: Self-pay | Admitting: *Deleted

## 2014-04-06 MED ORDER — SENNOSIDES-DOCUSATE SODIUM 8.6-50 MG PO TABS
1.0000 | ORAL_TABLET | Freq: Two times a day (BID) | ORAL | Status: AC | PRN
Start: 2014-04-06 — End: ?

## 2014-04-06 MED ORDER — RIVAROXABAN 20 MG PO TABS: 20.0000 mg | ORAL_TABLET | Freq: Every day | ORAL | Status: AC

## 2014-04-06 NOTE — Telephone Encounter (Signed)
Called spoke with pt hospice nurse [place]. She reports pt needs refill on xarelto 20 mg and also wants RX for senna-s called in since pt now opiods. Please advise RB thanks  --nurse requests call back at [phone]  [facility] [address]

## 2014-04-06 NOTE — Telephone Encounter (Signed)
Called nurse and is aware CB'S sent in. Nothing further needed

## 2014-04-06 NOTE — Telephone Encounter (Signed)
Ok to order 

## 2014-04-07 ENCOUNTER — Other Ambulatory Visit: Payer: Self-pay | Admitting: Emergency Medicine

## 2014-04-10 ENCOUNTER — Telehealth: Payer: Self-pay | Admitting: Emergency Medicine

## 2014-04-10 NOTE — Telephone Encounter (Signed)
Called spoke with Jackelyn Poling w/ [facility] - she saw pt yesterday Social worker saw pt today and called Debbie about an hour ago stating that pt is open to trying Morphine.  Would also like to try something for depression (not on anything long-acting for depression, only Xanax).  Per Jackelyn Poling, pt is not acute and this can wait until tomorrow Sats are unchanged with drops into the 70's w/ activity but in the 90's when he is able to sit 'for a while'  RB is scheduled off all week Dr Melvyn Novas, would you mind okaying rx for pt?

## 2014-04-10 NOTE — Telephone Encounter (Signed)
Fine with me

## 2014-04-11 MED ORDER — MORPHINE SULFATE (CONCENTRATE) 20 MG/ML PO SOLN: 5.0000 mg | ORAL | Status: AC | PRN

## 2014-04-11 NOTE — Telephone Encounter (Signed)
Called and spoke to [place]. Debbie stated the normal morphine dose is 22m/ml, 0.254mq4h prn for SOB. DeJackelyn Polings also requesting a medication to help with pt's depression. Dr. ByLamonte Sakaiill be available on 04/18/14.   Dr. WeMelvyn Novaslease advise.

## 2014-04-11 NOTE — Telephone Encounter (Signed)
Signed rx faxed to preferred pharmacy, hospice pt noted on rx. Pt's wife and Debbie aware.   Dr. Lamonte Sakai are you ok prescribing something to help pt's depression or defer?

## 2014-04-11 NOTE — Telephone Encounter (Signed)
Dr. Melvyn Novas just to clarify are you ok with with the morphine order?

## 2014-04-11 NOTE — Telephone Encounter (Signed)
yes 

## 2014-04-11 NOTE — Telephone Encounter (Signed)
Depression meds take weeks to work so no need for the doctor of the day to presribe any and best done by primary or [name] when he returns

## 2014-04-16 NOTE — Telephone Encounter (Signed)
Dr. B please advise if you're ok with prescribing something for pt's depression.  Thanks!

## 2014-04-17 MED ORDER — ESCITALOPRAM OXALATE 5 MG PO TABS: 5.0000 mg | ORAL_TABLET | Freq: Every day | ORAL | Status: AC

## 2014-04-17 NOTE — Telephone Encounter (Signed)
Called spoke with [facility] and advised of RB's recs as stated below Jackelyn Poling stated that Hospice prefers to do a 2 week rx with refills >> #15 with 3 refills [facility] pharmacist Ardine Bjork, she is aware this is a Hospice pt Med list updated Nothing further needed at this time; will sign off

## 2014-04-17 NOTE — Telephone Encounter (Signed)
Yes. Please order Lexapro 49m qd. We will consider increasing after a month depending on how he is doing

## 2014-04-26 ENCOUNTER — Telehealth: Payer: Self-pay | Admitting: Emergency Medicine

## 2014-04-26 MED ORDER — FENTANYL 12 MCG/HR TD PT72: 12.5000 ug | MEDICATED_PATCH | TRANSDERMAL | Status: AC

## 2014-04-26 NOTE — Telephone Encounter (Signed)
Spoke with Freda Munro with Hospice, states that pt is out of fentanyl patches and will need a new one tomorrow.  TP had prescribed 5 patches on 12/15.  Since RB is out of the office today and off this afternoon, I spoke with TP who says she is happy to sign for this in RB's absence.  Pt has appt with RB on 05/08/14.    This has been signed by TP and faxed to pharmacy.  Freda Munro is aware that this has been sent.  Nothing further needed.

## 2014-05-04 ENCOUNTER — Encounter (HOSPITAL_COMMUNITY): Payer: Self-pay | Admitting: Emergency Medicine

## 2014-05-04 ENCOUNTER — Emergency Department (HOSPITAL_COMMUNITY): Payer: BLUE CROSS/BLUE SHIELD

## 2014-05-04 ENCOUNTER — Inpatient Hospital Stay (HOSPITAL_COMMUNITY)
Admission: EM | Admit: 2014-05-04 | Discharge: 2014-05-21 | DRG: 196 | Disposition: E | Payer: BLUE CROSS/BLUE SHIELD | Attending: Internal Medicine | Admitting: Internal Medicine

## 2014-05-04 ENCOUNTER — Other Ambulatory Visit: Payer: Self-pay

## 2014-05-04 DIAGNOSIS — J8410 Pulmonary fibrosis, unspecified: Secondary | ICD-10-CM | POA: Diagnosis not present

## 2014-05-04 DIAGNOSIS — J189 Pneumonia, unspecified organism: Secondary | ICD-10-CM | POA: Diagnosis present

## 2014-05-04 DIAGNOSIS — Z833 Family history of diabetes mellitus: Secondary | ICD-10-CM

## 2014-05-04 DIAGNOSIS — Z515 Encounter for palliative care: Secondary | ICD-10-CM

## 2014-05-04 DIAGNOSIS — Z86718 Personal history of other venous thrombosis and embolism: Secondary | ICD-10-CM | POA: Diagnosis not present

## 2014-05-04 DIAGNOSIS — E873 Alkalosis: Secondary | ICD-10-CM | POA: Diagnosis present

## 2014-05-04 DIAGNOSIS — Z79899 Other long term (current) drug therapy: Secondary | ICD-10-CM

## 2014-05-04 DIAGNOSIS — J449 Chronic obstructive pulmonary disease, unspecified: Secondary | ICD-10-CM | POA: Diagnosis present

## 2014-05-04 DIAGNOSIS — Z8249 Family history of ischemic heart disease and other diseases of the circulatory system: Secondary | ICD-10-CM | POA: Diagnosis not present

## 2014-05-04 DIAGNOSIS — D696 Thrombocytopenia, unspecified: Secondary | ICD-10-CM | POA: Diagnosis present

## 2014-05-04 DIAGNOSIS — Z66 Do not resuscitate: Secondary | ICD-10-CM | POA: Diagnosis present

## 2014-05-04 DIAGNOSIS — R000 Tachycardia, unspecified: Secondary | ICD-10-CM | POA: Diagnosis present

## 2014-05-04 DIAGNOSIS — L4050 Arthropathic psoriasis, unspecified: Secondary | ICD-10-CM | POA: Diagnosis present

## 2014-05-04 DIAGNOSIS — Z7901 Long term (current) use of anticoagulants: Secondary | ICD-10-CM | POA: Diagnosis not present

## 2014-05-04 DIAGNOSIS — Z87891 Personal history of nicotine dependence: Secondary | ICD-10-CM | POA: Diagnosis not present

## 2014-05-04 DIAGNOSIS — E785 Hyperlipidemia, unspecified: Secondary | ICD-10-CM | POA: Diagnosis present

## 2014-05-04 DIAGNOSIS — E1139 Type 2 diabetes mellitus with other diabetic ophthalmic complication: Secondary | ICD-10-CM | POA: Diagnosis present

## 2014-05-04 DIAGNOSIS — E876 Hypokalemia: Secondary | ICD-10-CM | POA: Diagnosis present

## 2014-05-04 DIAGNOSIS — J849 Interstitial pulmonary disease, unspecified: Secondary | ICD-10-CM | POA: Diagnosis present

## 2014-05-04 DIAGNOSIS — J9620 Acute and chronic respiratory failure, unspecified whether with hypoxia or hypercapnia: Secondary | ICD-10-CM | POA: Diagnosis present

## 2014-05-04 DIAGNOSIS — IMO0002 Reserved for concepts with insufficient information to code with codable children: Secondary | ICD-10-CM | POA: Diagnosis present

## 2014-05-04 DIAGNOSIS — Y95 Nosocomial condition: Secondary | ICD-10-CM | POA: Diagnosis present

## 2014-05-04 DIAGNOSIS — I5032 Chronic diastolic (congestive) heart failure: Secondary | ICD-10-CM | POA: Diagnosis present

## 2014-05-04 DIAGNOSIS — I2510 Atherosclerotic heart disease of native coronary artery without angina pectoris: Secondary | ICD-10-CM | POA: Diagnosis present

## 2014-05-04 DIAGNOSIS — I739 Peripheral vascular disease, unspecified: Secondary | ICD-10-CM | POA: Diagnosis present

## 2014-05-04 DIAGNOSIS — I509 Heart failure, unspecified: Secondary | ICD-10-CM

## 2014-05-04 DIAGNOSIS — I272 Other secondary pulmonary hypertension: Secondary | ICD-10-CM | POA: Diagnosis present

## 2014-05-04 DIAGNOSIS — M069 Rheumatoid arthritis, unspecified: Secondary | ICD-10-CM | POA: Diagnosis present

## 2014-05-04 DIAGNOSIS — R791 Abnormal coagulation profile: Secondary | ICD-10-CM

## 2014-05-04 DIAGNOSIS — Z79891 Long term (current) use of opiate analgesic: Secondary | ICD-10-CM

## 2014-05-04 DIAGNOSIS — Z9981 Dependence on supplemental oxygen: Secondary | ICD-10-CM

## 2014-05-04 DIAGNOSIS — J9621 Acute and chronic respiratory failure with hypoxia: Secondary | ICD-10-CM | POA: Diagnosis present

## 2014-05-04 DIAGNOSIS — Z7952 Long term (current) use of systemic steroids: Secondary | ICD-10-CM

## 2014-05-04 DIAGNOSIS — R451 Restlessness and agitation: Secondary | ICD-10-CM | POA: Diagnosis present

## 2014-05-04 DIAGNOSIS — E871 Hypo-osmolality and hyponatremia: Secondary | ICD-10-CM | POA: Diagnosis not present

## 2014-05-04 DIAGNOSIS — Z85828 Personal history of other malignant neoplasm of skin: Secondary | ICD-10-CM

## 2014-05-04 DIAGNOSIS — Z7401 Bed confinement status: Secondary | ICD-10-CM

## 2014-05-04 DIAGNOSIS — R0603 Acute respiratory distress: Secondary | ICD-10-CM

## 2014-05-04 DIAGNOSIS — I270 Primary pulmonary hypertension: Secondary | ICD-10-CM | POA: Diagnosis not present

## 2014-05-04 DIAGNOSIS — E1165 Type 2 diabetes mellitus with hyperglycemia: Secondary | ICD-10-CM | POA: Diagnosis present

## 2014-05-04 DIAGNOSIS — I50812 Chronic right heart failure: Secondary | ICD-10-CM

## 2014-05-04 LAB — COMPREHENSIVE METABOLIC PANEL
ALK PHOS: 52 U/L (ref 39–117)
ALT: 22 U/L (ref 0–53)
ALT: 19 U/L (ref 0–53)
AST: 36 U/L (ref 0–37)
AST: 31 U/L (ref 0–37)
Albumin: 4 g/dL (ref 3.5–5.2)
Albumin: 3.3 g/dL — ABNORMAL LOW (ref 3.5–5.2)
Alkaline Phosphatase: 59 U/L (ref 39–117)
Anion gap: 17 — ABNORMAL HIGH (ref 5–15)
Anion gap: 15 (ref 5–15)
BILIRUBIN TOTAL: 2.2 mg/dL — AB (ref 0.3–1.2)
BILIRUBIN TOTAL: 2 mg/dL — AB (ref 0.3–1.2)
BUN: 22 mg/dL (ref 6–23)
BUN: 20 mg/dL (ref 6–23)
CHLORIDE: 72 meq/L — AB (ref 96–112)
CHLORIDE: 76 meq/L — AB (ref 96–112)
CO2: 33 mmol/L — ABNORMAL HIGH (ref 19–32)
CO2: 30 mmol/L (ref 19–32)
CREATININE: 1.13 mg/dL (ref 0.50–1.35)
Calcium: 7.7 mg/dL — ABNORMAL LOW (ref 8.4–10.5)
Calcium: 6.9 mg/dL — ABNORMAL LOW (ref 8.4–10.5)
Creatinine, Ser: 1.19 mg/dL (ref 0.50–1.35)
GFR calc Af Amer: 72 mL/min — ABNORMAL LOW (ref >90)
GFR calc Af Amer: 77 mL/min — ABNORMAL LOW (ref >90)
GFR calc non Af Amer: 62 mL/min — ABNORMAL LOW (ref >90)
GFR calc non Af Amer: 66 mL/min — ABNORMAL LOW (ref >90)
GLUCOSE: 122 mg/dL — AB (ref 70–99)
GLUCOSE: 166 mg/dL — AB (ref 70–99)
POTASSIUM: 2.9 mmol/L — AB (ref 3.5–5.1)
Potassium: 3.2 mmol/L — ABNORMAL LOW (ref 3.5–5.1)
SODIUM: 121 mmol/L — AB (ref 135–145)
Sodium: 122 mmol/L — ABNORMAL LOW (ref 135–145)
TOTAL PROTEIN: 6.6 g/dL (ref 6.0–8.3)
Total Protein: 5.7 g/dL — ABNORMAL LOW (ref 6.0–8.3)

## 2014-05-04 LAB — I-STAT ARTERIAL BLOOD GAS, ED
Acid-Base Excess: 7 mmol/L — ABNORMAL HIGH (ref 0.0–2.0)
Bicarbonate: 31.9 mEq/L — ABNORMAL HIGH (ref 20.0–24.0)
O2 Saturation: 96 %
Patient temperature: 98.6
TCO2: 33 mmol/L (ref 0–100)
pCO2 arterial: 42.6 mmHg (ref 35.0–45.0)
pH, Arterial: 7.483 — ABNORMAL HIGH (ref 7.350–7.450)
pO2, Arterial: 75 mmHg — ABNORMAL LOW (ref 80.0–100.0)

## 2014-05-04 LAB — I-STAT TROPONIN, ED: Troponin i, poc: 0.02 ng/mL (ref 0.00–0.08)

## 2014-05-04 LAB — PROTIME-INR
INR: 3.65 — ABNORMAL HIGH (ref 0.00–1.49)
PROTHROMBIN TIME: 36.6 s — AB (ref 11.6–15.2)

## 2014-05-04 LAB — CBC
HCT: 56.2 % — ABNORMAL HIGH (ref 39.0–52.0)
HEMOGLOBIN: 19.9 g/dL — AB (ref 13.0–17.0)
MCH: 32.6 pg (ref 26.0–34.0)
MCHC: 35.4 g/dL (ref 30.0–36.0)
MCV: 92.1 fL (ref 78.0–100.0)
Platelets: 115 10*3/uL — ABNORMAL LOW (ref 150–400)
RBC: 6.1 MIL/uL — ABNORMAL HIGH (ref 4.22–5.81)
RDW: 14.8 % (ref 11.5–15.5)
WBC: 13 10*3/uL — AB (ref 4.0–10.5)

## 2014-05-04 LAB — BRAIN NATRIURETIC PEPTIDE: B Natriuretic Peptide: 850.4 pg/mL — ABNORMAL HIGH (ref 0.0–100.0)

## 2014-05-04 LAB — CBC WITH DIFFERENTIAL/PLATELET
BASOS ABS: 0 10*3/uL (ref 0.0–0.1)
Basophils Relative: 0 % (ref 0–1)
Eosinophils Absolute: 0.2 10*3/uL (ref 0.0–0.7)
Eosinophils Relative: 1 % (ref 0–5)
HCT: 54.2 % — ABNORMAL HIGH (ref 39.0–52.0)
Hemoglobin: 19 g/dL — ABNORMAL HIGH (ref 13.0–17.0)
LYMPHS ABS: 1.1 10*3/uL (ref 0.7–4.0)
LYMPHS PCT: 9 % — AB (ref 12–46)
MCH: 32.8 pg (ref 26.0–34.0)
MCHC: 35.1 g/dL (ref 30.0–36.0)
MCV: 93.6 fL (ref 78.0–100.0)
MONOS PCT: 9 % (ref 3–12)
Monocytes Absolute: 1.1 10*3/uL — ABNORMAL HIGH (ref 0.1–1.0)
NEUTROS PCT: 81 % — AB (ref 43–77)
Neutro Abs: 10.1 10*3/uL — ABNORMAL HIGH (ref 1.7–7.7)
PLATELETS: 108 10*3/uL — AB (ref 150–400)
RBC: 5.79 MIL/uL (ref 4.22–5.81)
RDW: 14.9 % (ref 11.5–15.5)
WBC: 12.5 10*3/uL — AB (ref 4.0–10.5)

## 2014-05-04 LAB — APTT: aPTT: 52 seconds — ABNORMAL HIGH (ref 24–37)

## 2014-05-04 LAB — URINALYSIS, ROUTINE W REFLEX MICROSCOPIC
Bilirubin Urine: NEGATIVE
Glucose, UA: NEGATIVE mg/dL
Hgb urine dipstick: NEGATIVE
Ketones, ur: NEGATIVE mg/dL
Leukocytes, UA: NEGATIVE
NITRITE: NEGATIVE
Protein, ur: NEGATIVE mg/dL
Specific Gravity, Urine: 1.01 (ref 1.005–1.030)
Urobilinogen, UA: 0.2 mg/dL (ref 0.0–1.0)
pH: 6 (ref 5.0–8.0)

## 2014-05-04 LAB — STREP PNEUMONIAE URINARY ANTIGEN: Strep Pneumo Urinary Antigen: NEGATIVE

## 2014-05-04 MED ORDER — FOLIC ACID 1 MG PO TABS
1.0000 mg | ORAL_TABLET | Freq: Every day | ORAL | Status: DC
  Administered 2014-05-04: 1 mg via ORAL
  Filled 2014-05-04: qty 1

## 2014-05-04 MED ORDER — PREDNISONE 20 MG PO TABS
20.0000 mg | ORAL_TABLET | Freq: Every day | ORAL | Status: DC
  Administered 2014-05-04 – 2014-05-05 (×2): 20 mg via ORAL
  Filled 2014-05-04 (×3): qty 1

## 2014-05-04 MED ORDER — FENTANYL CITRATE 0.05 MG/ML IJ SOLN
50.0000 ug | INTRAMUSCULAR | Status: DC | PRN
  Filled 2014-05-04: qty 2

## 2014-05-04 MED ORDER — MULTIVITAMINS PO CAPS: 1.0000 | ORAL_CAPSULE | Freq: Every day | ORAL | Status: DC

## 2014-05-04 MED ORDER — ACETAMINOPHEN ER 650 MG PO TBCR: 650.0000 mg | EXTENDED_RELEASE_TABLET | Freq: Two times a day (BID) | ORAL | Status: DC

## 2014-05-04 MED ORDER — TREPROSTINIL 0.6 MG/ML IN SOLN
18.0000 ug | Freq: Four times a day (QID) | RESPIRATORY_TRACT | Status: DC
  Administered 2014-05-05: 18 ug via RESPIRATORY_TRACT

## 2014-05-04 MED ORDER — FENTANYL 12 MCG/HR TD PT72: 12.5000 ug | MEDICATED_PATCH | TRANSDERMAL | Status: DC

## 2014-05-04 MED ORDER — SIMVASTATIN 20 MG PO TABS
20.0000 mg | ORAL_TABLET | Freq: Every day | ORAL | Status: DC
  Filled 2014-05-04: qty 1

## 2014-05-04 MED ORDER — DEXTROSE 5 % IV SOLN
1.0000 g | Freq: Three times a day (TID) | INTRAVENOUS | Status: DC
  Administered 2014-05-04: 1 g via INTRAVENOUS
  Filled 2014-05-04: qty 1

## 2014-05-04 MED ORDER — TIOTROPIUM BROMIDE MONOHYDRATE 18 MCG IN CAPS
18.0000 ug | ORAL_CAPSULE | Freq: Every day | RESPIRATORY_TRACT | Status: DC
  Administered 2014-05-04 – 2014-05-05 (×2): 18 ug via RESPIRATORY_TRACT
  Filled 2014-05-04: qty 5

## 2014-05-04 MED ORDER — RIVAROXABAN 20 MG PO TABS
20.0000 mg | ORAL_TABLET | Freq: Every day | ORAL | Status: DC
  Filled 2014-05-04: qty 1

## 2014-05-04 MED ORDER — ALBUTEROL SULFATE (2.5 MG/3ML) 0.083% IN NEBU
2.5000 mg | INHALATION_SOLUTION | RESPIRATORY_TRACT | Status: DC | PRN
  Administered 2014-05-04 (×2): 2.5 mg via RESPIRATORY_TRACT
  Filled 2014-05-04 (×2): qty 3

## 2014-05-04 MED ORDER — VANCOMYCIN HCL IN DEXTROSE 750-5 MG/150ML-% IV SOLN
750.0000 mg | Freq: Two times a day (BID) | INTRAVENOUS | Status: DC
  Filled 2014-05-04 (×2): qty 150

## 2014-05-04 MED ORDER — ROCURONIUM BROMIDE 50 MG/5ML IV SOLN
INTRAVENOUS | Status: AC
  Filled 2014-05-04: qty 2

## 2014-05-04 MED ORDER — SODIUM CHLORIDE 0.9 % IV BOLUS (SEPSIS)
1000.0000 mL | Freq: Once | INTRAVENOUS | Status: AC
  Administered 2014-05-04: 1000 mL via INTRAVENOUS

## 2014-05-04 MED ORDER — SUCCINYLCHOLINE CHLORIDE 20 MG/ML IJ SOLN
INTRAMUSCULAR | Status: AC
  Filled 2014-05-04: qty 1

## 2014-05-04 MED ORDER — FUROSEMIDE 40 MG PO TABS
60.0000 mg | ORAL_TABLET | Freq: Two times a day (BID) | ORAL | Status: DC
  Administered 2014-05-04 – 2014-05-05 (×2): 60 mg via ORAL
  Filled 2014-05-04 (×2): qty 1
  Filled 2014-05-04: qty 3
  Filled 2014-05-04: qty 1

## 2014-05-04 MED ORDER — FLUTICASONE PROPIONATE 50 MCG/ACT NA SUSP
2.0000 | Freq: Two times a day (BID) | NASAL | Status: DC
  Filled 2014-05-04: qty 16

## 2014-05-04 MED ORDER — PANTOPRAZOLE SODIUM 40 MG PO TBEC
40.0000 mg | DELAYED_RELEASE_TABLET | Freq: Every day | ORAL | Status: DC
  Administered 2014-05-04: 40 mg via ORAL
  Filled 2014-05-04: qty 1

## 2014-05-04 MED ORDER — ACETAMINOPHEN 325 MG PO TABS: 650.0000 mg | ORAL_TABLET | Freq: Two times a day (BID) | ORAL | Status: DC

## 2014-05-04 MED ORDER — VANCOMYCIN HCL IN DEXTROSE 1-5 GM/200ML-% IV SOLN
1000.0000 mg | Freq: Once | INTRAVENOUS | Status: AC
Start: 2014-05-04 — End: 2014-05-04
  Administered 2014-05-04: 1000 mg via INTRAVENOUS
  Filled 2014-05-04: qty 200

## 2014-05-04 MED ORDER — FEBUXOSTAT 40 MG PO TABS
40.0000 mg | ORAL_TABLET | Freq: Every day | ORAL | Status: DC
  Administered 2014-05-04: 40 mg via ORAL
  Filled 2014-05-04 (×2): qty 1

## 2014-05-04 MED ORDER — AMBRISENTAN 10 MG PO TABS
10.0000 mg | ORAL_TABLET | Freq: Every day | ORAL | Status: DC
  Filled 2014-05-04: qty 1

## 2014-05-04 MED ORDER — B COMPLEX PO TABS: 1.0000 | ORAL_TABLET | Freq: Every day | ORAL | Status: DC

## 2014-05-04 MED ORDER — DEXTROSE 5 % IV SOLN
2.0000 g | Freq: Once | INTRAVENOUS | Status: AC
  Administered 2014-05-04: 2 g via INTRAVENOUS
  Filled 2014-05-04: qty 2

## 2014-05-04 MED ORDER — METOLAZONE 5 MG PO TABS
5.0000 mg | ORAL_TABLET | Freq: Every day | ORAL | Status: DC
  Administered 2014-05-04 – 2014-05-05 (×2): 5 mg via ORAL
  Filled 2014-05-04 (×2): qty 1

## 2014-05-04 MED ORDER — CALCIUM-VITAMIN D 250-125 MG-UNIT PO TABS: 1.0000 | ORAL_TABLET | Freq: Two times a day (BID) | ORAL | Status: DC

## 2014-05-04 MED ORDER — ACETAMINOPHEN 325 MG PO TABS: 650.0000 mg | ORAL_TABLET | Freq: Four times a day (QID) | ORAL | Status: DC | PRN

## 2014-05-04 MED ORDER — SODIUM CHLORIDE 0.9 % IV SOLN
1.0000 mg/h | INTRAVENOUS | Status: DC
  Administered 2014-05-04: 1 mg/h via INTRAVENOUS
  Filled 2014-05-04: qty 10

## 2014-05-04 MED ORDER — LORATADINE 10 MG PO TABS
10.0000 mg | ORAL_TABLET | Freq: Every day | ORAL | Status: DC
  Administered 2014-05-04: 10 mg via ORAL
  Filled 2014-05-04: qty 1

## 2014-05-04 MED ORDER — ONDANSETRON HCL 4 MG/2ML IJ SOLN
4.0000 mg | Freq: Once | INTRAMUSCULAR | Status: AC
  Administered 2014-05-04: 4 mg via INTRAVENOUS
  Filled 2014-05-04: qty 2

## 2014-05-04 MED ORDER — AMBRISENTAN 10 MG PO TABS
10.0000 mg | ORAL_TABLET | Freq: Every day | ORAL | Status: DC
  Administered 2014-05-04 – 2014-05-05 (×2): 10 mg via ORAL

## 2014-05-04 MED ORDER — LIDOCAINE HCL (CARDIAC) 20 MG/ML IV SOLN
INTRAVENOUS | Status: AC
  Filled 2014-05-04: qty 5

## 2014-05-04 MED ORDER — SENNOSIDES-DOCUSATE SODIUM 8.6-50 MG PO TABS
1.0000 | ORAL_TABLET | Freq: Two times a day (BID) | ORAL | Status: DC | PRN
Start: 2014-05-04 — End: 2014-05-05
  Filled 2014-05-04: qty 1

## 2014-05-04 MED ORDER — ETOMIDATE 2 MG/ML IV SOLN
INTRAVENOUS | Status: AC
  Filled 2014-05-04: qty 20

## 2014-05-04 MED ORDER — ADULT MULTIVITAMIN W/MINERALS CH: 1.0000 | ORAL_TABLET | Freq: Every day | ORAL | Status: DC

## 2014-05-04 MED ORDER — ESCITALOPRAM OXALATE 10 MG PO TABS
5.0000 mg | ORAL_TABLET | Freq: Every day | ORAL | Status: DC
  Administered 2014-05-04: 5 mg via ORAL
  Filled 2014-05-04: qty 1

## 2014-05-04 MED ORDER — CALCIUM CARBONATE-VITAMIN D 500-200 MG-UNIT PO TABS
1.0000 | ORAL_TABLET | Freq: Two times a day (BID) | ORAL | Status: DC
  Administered 2014-05-04: 1 via ORAL
  Filled 2014-05-04 (×3): qty 1

## 2014-05-04 MED ORDER — B COMPLEX-C PO TABS
1.0000 | ORAL_TABLET | Freq: Every day | ORAL | Status: DC
  Administered 2014-05-04: 1 via ORAL
  Filled 2014-05-04: qty 1

## 2014-05-04 MED ORDER — MORPHINE SULFATE (CONCENTRATE) 20 MG/ML PO SOLN
5.0000 mg | ORAL | Status: DC | PRN
Start: 2014-05-04 — End: 2014-05-05
  Filled 2014-05-04: qty 0.25

## 2014-05-04 MED ORDER — LEVOFLOXACIN IN D5W 750 MG/150ML IV SOLN
750.0000 mg | Freq: Once | INTRAVENOUS | Status: AC
Start: 2014-05-04 — End: 2014-05-04
  Administered 2014-05-04: 750 mg via INTRAVENOUS
  Filled 2014-05-04: qty 150

## 2014-05-04 MED ORDER — ALPRAZOLAM 0.5 MG PO TABS
0.5000 mg | ORAL_TABLET | Freq: Three times a day (TID) | ORAL | Status: DC | PRN
  Administered 2014-05-04 – 2014-05-05 (×3): 0.5 mg via ORAL
  Filled 2014-05-04: qty 2
  Filled 2014-05-04: qty 1
  Filled 2014-05-04: qty 2

## 2014-05-04 NOTE — Consult Note (Signed)
Name: [name] MRN: [id number] DOB: 04/14/1951    ADMISSION DATE:  05/15/2014 CONSULTATION DATE:  05/19/2014  REFERRING MD :  Dr Conley Canal, [facility]  CHIEF COMPLAINT:  Dyspnea and chest pain  HISTORY OF PRESENT ILLNESS:  [name] is a very pleasant man, well known to me from the office. I have followed him for chronic multifactorial hypoxic respiratory failure due to ILD and severe PAH that are both presumed to be immune-modulated by his psoriatic arthritis (on humira + pred). He also has underlying COPD for which he is on bronchodilators. Over the last several years his PAH regimen has been up-titrated and he is now on Letaris, Tyvaso 12puff qid, high flow O2 at 14-15L/min, xarelto, and diuretics. We have investigated IV therapy in the past, as well as transplant, but he was not a candidate. His symptoms have progressed and in December we referred him for hospice support. His wife reports that over the last week, despite his usual therapies + addition of narcotics, his dyspnea and chest discomfort have been unmanageable. He also has understandably become more agitated. In the ED this pm he is very uncomfortable, dyspneic, still having chest pain. His wife and son are present. Pt tells me that he wants to be made comfortable, that this is his highest priority. His wife agrees. He is being admitted to the hospital by Dr Conley Canal with [facility].   PAST MEDICAL HISTORY :   has a past medical history of Pulmonary hypertension; Thrombocytopenia, unspecified; Chronic pulmonary heart disease, unspecified; Peripheral vascular disease, unspecified; Other and unspecified hyperlipidemia; Postinflammatory pulmonary fibrosis; Other psoriasis; Rheumatoid arthritis(714.0); Chronic respiratory failure; Hypoxemia; Chronic airway obstruction, not elsewhere classified; Hyperlipidemia; Perforation of colon (10/2001); Internal hemorrhoids; Diverticulosis; Shortness of breath; Chronic right-sided HF (heart failure);  Headache(784.0); Cancer (skin); History of DVT (deep vein thrombosis) - right leg; CAD (coronary artery disease), native coronary artery (2012); and CHF (congestive heart failure).  has past surgical history that includes Repair of cecal perforation (10/2001); Colon surgery (2003); Back surgery; Tonsillectomy; doppler echocardiography (June 2012); Persantine Myoview (June 2012); and Left and Right Heart Catheterization (2012).   Prior to Admission medications   Medication Sig Start Date End Date Taking? Authorizing Provider  acetaminophen (TYLENOL ARTHRITIS PAIN) 650 MG CR tablet Take 1,300 mg by mouth 2 (two) times daily.    Yes Historical Provider, MD  albuterol (PROVENTIL) (2.5 MG/3ML) 0.083% nebulizer solution Take 3 mLs (2.5 mg total) by nebulization every 6 (six) hours as needed for wheezing. 06/21/12  Yes Collene Gobble, MD  ALPRAZolam Duanne Moron) 0.5 MG tablet Take 1 tablet (0.5 mg total) by mouth 3 (three) times daily as needed for anxiety or sleep (air hunger). 04/03/14  Yes Tammy S Parrett, NP  b complex vitamins tablet Take 1 tablet by mouth daily.   Yes Historical Provider, MD  calcium-vitamin D (OSCAL) 250-125 MG-UNIT per tablet Take 1 tablet by mouth 2 (two) times daily.   Yes Historical Provider, MD  cetirizine (ZYRTEC) 10 MG tablet Take 10 mg by mouth daily.     Yes Historical Provider, MD  escitalopram (LEXAPRO) 5 MG tablet Take 1 tablet (5 mg total) by mouth daily. 04/17/14  Yes Collene Gobble, MD  fluticasone (FLONASE) 50 MCG/ACT nasal spray Place 2 sprays into the nose 2 (two) times daily. 01/19/13  Yes Raylene Miyamoto, MD  folic acid (FOLVITE) 1 MG tablet Take 1 mg by mouth daily.     Yes Historical Provider, MD  furosemide (LASIX) 20 MG  tablet Take 3 tablets (60 mg total) by mouth 2 (two) times daily. 03/27/14  Yes Donita Brooks, NP  glimepiride (AMARYL) 2 MG tablet Take 1 tablet (2 mg total) by mouth daily before breakfast. 01/30/13  Yes Tammy S Parrett, NP  LETAIRIS 10 MG tablet  TAKE 1 TABLET (10 MG) ORALLY DAILY. DO NOT HANDLE IF PREGNANT. DO NOTSPLIT, CRUSH OR CHEW. AVOID INHALATION AND CONTACT WITH SKIN OR EYE. 01/11/14  Yes Collene Gobble, MD  metolazone (ZAROXOLYN) 5 MG tablet Take 1 tablet (5 mg total) by mouth daily. Patient taking differently: Take 5 mg by mouth every other day.  03/27/14  Yes Donita Brooks, NP  morphine (ROXANOL) 20 MG/ML concentrated solution Take 0.25 mLs (5 mg total) by mouth every 4 (four) hours as needed for severe pain. 04/11/14  Yes Tanda Rockers, MD  Multiple Vitamin (MULTIVITAMIN) capsule Take 1 capsule by mouth daily.     Yes Historical Provider, MD  pantoprazole (PROTONIX) 40 MG tablet Take 40 mg by mouth daily.   Yes Historical Provider, MD  potassium chloride SA (K-DUR,KLOR-CON) 20 MEQ tablet Take 80 mEq by mouth 2 (two) times daily.    Yes Historical Provider, MD  predniSONE (DELTASONE) 20 MG tablet Take 1 tablet (20 mg total) by mouth daily with breakfast. 02/13/14  Yes Collene Gobble, MD  rivaroxaban (XARELTO) 20 MG TABS tablet Take 1 tablet (20 mg total) by mouth daily. 04/06/14  Yes Collene Gobble, MD  senna-docusate (SENOKOT-S) 8.6-50 MG per tablet Take 1 tablet by mouth 2 (two) times daily as needed for mild constipation. 04/06/14  Yes Collene Gobble, MD  simvastatin (ZOCOR) 20 MG tablet TAKE 1 TABLET DAILY AT BEDTIME.   Yes Leonie Man, MD  tiotropium (SPIRIVA HANDIHALER) 18 MCG inhalation capsule PLACE 1 CAPSULE INTO INHALER AND INHALE INTO THE LUNGS ONCE DAILY 12/18/13  Yes Collene Gobble, MD  TYVASO REFILL 0.6 MG/ML SOLN USE UP TO 9 BREATHS 4 TIMES A DAY WITH TYVASO UNIT. LOAD DRUG INTO NEBULIZER BEFORE 1ST USE OF DAY. 04/09/14  Yes Collene Gobble, MD  ULORIC 40 MG tablet Take 1 tablet by mouth daily. 12/11/13  Yes Historical Provider, MD  adalimumab (HUMIRA) 40 MG/0.8ML injection Inject 40 mg into the skin every 14 (fourteen) days.     Historical Provider, MD  fentaNYL (DURAGESIC - DOSED MCG/HR) 12 MCG/HR Place 1 patch  (12.5 mcg total) onto the skin every 3 (three) days. 04/26/14   Melvenia Needles, NP  OXYGEN-HELIUM IN 8+ Liters continuous inhalation    Historical Provider, MD   No Known Allergies  FAMILY HISTORY:  family history includes Diabetes in his brother and mother; Heart disease in his mother. SOCIAL HISTORY:  reports that he quit smoking about 8 years ago. His smoking use included Cigarettes. He has a 84 pack-year smoking history. He has never used smokeless tobacco. He reports that he drinks about 0.6 oz of alcohol per week. He reports that he does not use illicit drugs.   SUBJECTIVE:  He c/o SOB and CP at rest, air hunger.   VITAL SIGNS: Temp:  [97.2 F (36.2 C)] 97.2 F (36.2 C) (01/15 0254) Pulse Rate:  [77-95] 91 (01/15 1600) Resp:  [10-25] 16 (01/15 1600) BP: (100-145)/(40-97) 120/70 mmHg (01/15 1600) SpO2:  [77 %-100 %] 94 % (01/15 1600) Weight:  [79.833 kg (176 lb)] 79.833 kg (176 lb) (01/15 0254)  PHYSICAL EXAMINATION: General:  Ashen man, cyanotic, very uncomfortable Neuro:  Awake, alert, intermittently agitated, non-focal HEENT:  OP dry, face ruddy, PERRL Cardiovascular:  Tachycardic, loud S2 Lungs:  Coarse bilaterally    Recent Labs Lab 05/08/2014 0247 05/06/2014 0546  NA 122* 121*  K 3.2* 2.9*  CL 72* 76*  CO2 33* 30  BUN 22 20  CREATININE 1.19 1.13  GLUCOSE 122* 166*    Recent Labs Lab 05/11/2014 0247 04/21/2014 0546  HGB 19.9* 19.0*  HCT 56.2* 54.2*  WBC 13.0* 12.5*  PLT 115* 108*   Dg Chest Port 1 View  05/10/2014   CLINICAL DATA:  Acute onset of shortness of breath and chest pressure. Initial encounter.  EXAM: PORTABLE CHEST - 1 VIEW  COMPARISON:  Chest radiograph performed 03/21/2014  FINDINGS: The lungs are well-aerated. Mildly worsened peripheral and bibasilar airspace opacities raise concern for pneumonia, superimposed on the patient's chronic interstitial lung disease and fibrosis. There is no evidence of pleural effusion or pneumothorax.  The  cardiomediastinal silhouette is enlarged. No acute osseous abnormalities are seen.  IMPRESSION: 1. Mildly worsened peripheral and bibasilar airspace opacities raise concern for pneumonia, superimposed on the patient's chronic interstitial lung disease and fibrosis. Alternatively, it could reflect interval worsening of interstitial lung disease. 2. Cardiomegaly noted.   Electronically Signed   By: Garald Balding M.D.   On: 05/14/2014 03:18    ASSESSMENT / PLAN:  Acute on chronic respiratory failure due to severe PAH. We have titrated up his therapy as much as possible but he is now having severe breakthrough symptoms. I have discussed the case with Dr Conley Canal and I agree with her - patient need symptom-based management with MSO4 gtt. I discussed this with the patient, his wife and son, and all parties agree. I have ordered the morphine gtt. Would probably continue his pulmonary meds including Tyvaso, Letaris, diuretics, to minimize SOB. Unclear whether to continue abx - may want to d/c anything that is not comfort based. Thank you for taking care of Derek Glazer. Please call if there is anything we can do to assist with his care.   Baltazar Apo, MD, PhD 04/29/2014, 4:51 PM [facility] [phone] or if no answer [phone]

## 2014-05-04 NOTE — ED Provider Notes (Signed)
CSN: [id number]     Arrival date & time 05/15/2014  3267 History  This chart was scribed for Everlene Balls, MD by Hilda Lias, ED Scribe. This patient was seen in room [facility] and the patient's care was started at 2:45 AM.  Chief Complaint  Patient presents with  . Shortness of Breath   LEVEL 5 CAVEAT due to acuity of condition  The history is provided by the patient and the EMS personnel. No language interpreter was used.     HPI Comments: [name] is a 64 y.o. male with COPD, CHF, and CAD who presents to the Emergency Department complaining of worsening SOB that has been present since 4 hours PTA. Pt constantly uses 15L of Oxygen at home. Pt also c/o central chest pain present in association with his SOB. Pt states that he does not currently have chest pain.     Past Medical History  Diagnosis Date  . Pulmonary hypertension     Echocardiogram evidence suggest 70 mmHg; by Right Heart Cath pressures are 55-60 mmHg peak  . Thrombocytopenia, unspecified   . Chronic pulmonary heart disease, unspecified     On home oxygen  . Peripheral vascular disease, unspecified     ABI is 87 on the right and 100 on the left. ; Followed by Dr. Shanon Rosser.  CTA abdomen-pelvis @ [facility] Severe aortoiliac disease; left common iliac occlusion severe right external iliac disease; severe SMA stenosis  . Other and unspecified hyperlipidemia   . Postinflammatory pulmonary fibrosis   . Other psoriasis   . Rheumatoid arthritis(714.0)   . Chronic respiratory failure   . Hypoxemia   . Chronic airway obstruction, not elsewhere classified   . Hyperlipidemia   . Perforation of colon 10/2001    After colonoscopy on 10/18/2001  . Internal hemorrhoids   . Diverticulosis   . Shortness of breath   . Chronic right-sided HF (heart failure)     Right-sided  . Headache(784.0)   . Cancer skin  . 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 CAD (coronary artery disease), native coronary artery 2012     Cath @ [facility]) --  70% prox Cx, prox RCA 30%, ~20% LM, D1.  . CHF (congestive heart failure)    Past Surgical History  Procedure Laterality Date  . Repair of cecal perforation  10/2001  . Colon surgery  2003    Secondary to bowel perforation after colonoscopy  . Back surgery    . Tonsillectomy    . Doppler echocardiography  June 2012    Normal LV size and function. Grade 1 diastolic dysfunction. Mild LA pressures elevation. A Cordis septal motion. Mildly dilated RV with increased pressures. Moderate to severe TR with PA pressures estimated 70 mmHg. Dilated IVC with blunted changes equals further CP.  Marland Kitchen Persantine myoview  June 2012    No ischemia or infarction  . Left and right heart catheterization  2012    70% circumflex lesion, 20-30% RCA, Left Main, D1;  RBP 55/6 mmHg (12 mmHg), BAP 53/20 mmHg. PCWP 13/12 mmHg (10). RAP 10 mmHg. They'll mean 66 mmHg, 91/49 mmHg.     Family History  Problem Relation Age of Onset  . Heart disease Mother   . Diabetes Mother   . Diabetes Brother    History  Substance Use Topics  . Smoking status: Former Smoker -- 2.00 packs/day for 42 years    Types: Cigarettes    Quit date: 04/20/2006  . Smokeless  tobacco: Never Used  . Alcohol Use: 0.6 oz/week    1 Cans of beer per week    Review of Systems  Unable to perform ROS: Acuity of condition  Respiratory: Positive for shortness of breath.   Cardiovascular: Positive for chest pain.      Allergies  Review of patient's allergies indicates no known allergies.  Home Medications   Prior to Admission medications   Medication Sig Start Date End Date Taking? Authorizing Provider  acetaminophen (TYLENOL ARTHRITIS PAIN) 650 MG CR tablet Take 1,300 mg by mouth 2 (two) times daily.     Historical Provider, MD  adalimumab (HUMIRA) 40 MG/0.8ML injection Inject 40 mg into the skin every 14 (fourteen) days.     Historical Provider, MD  albuterol (PROVENTIL) (2.5 MG/3ML) 0.083% nebulizer solution Take 3 mLs  (2.5 mg total) by nebulization every 6 (six) hours as needed for wheezing. 06/21/12   Collene Gobble, MD  ALPRAZolam Duanne Moron) 0.5 MG tablet Take 1 tablet (0.5 mg total) by mouth 3 (three) times daily as needed for anxiety or sleep (air hunger). 04/03/14   Tammy S Parrett, NP  b complex vitamins tablet Take 1 tablet by mouth daily.    Historical Provider, MD  calcium-vitamin D (OSCAL) 250-125 MG-UNIT per tablet Take 1 tablet by mouth 2 (two) times daily.    Historical Provider, MD  cetirizine (ZYRTEC) 10 MG tablet Take 10 mg by mouth daily.      Historical Provider, MD  escitalopram (LEXAPRO) 5 MG tablet Take 1 tablet (5 mg total) by mouth daily. 04/17/14   Collene Gobble, MD  fentaNYL (DURAGESIC - DOSED MCG/HR) 12 MCG/HR Place 1 patch (12.5 mcg total) onto the skin every 3 (three) days. 04/26/14   Tammy S Parrett, NP  fluticasone (FLONASE) 50 MCG/ACT nasal spray Place 2 sprays into the nose 2 (two) times daily. 01/19/13   Raylene Miyamoto, MD  folic acid (FOLVITE) 1 MG tablet Take 1 mg by mouth daily.      Historical Provider, MD  furosemide (LASIX) 20 MG tablet Take 3 tablets (60 mg total) by mouth 2 (two) times daily. 03/27/14   Donita Brooks, NP  glimepiride (AMARYL) 2 MG tablet Take 1 tablet (2 mg total) by mouth daily before breakfast. 01/30/13   Tammy S Parrett, NP  LETAIRIS 10 MG tablet TAKE 1 TABLET (10 MG) ORALLY DAILY. DO NOT HANDLE IF PREGNANT. DO NOTSPLIT, CRUSH OR CHEW. AVOID INHALATION AND CONTACT WITH SKIN OR EYE. 01/11/14   Collene Gobble, MD  metolazone (ZAROXOLYN) 5 MG tablet Take 1 tablet (5 mg total) by mouth daily. Patient taking differently: Take 5 mg by mouth every other day.  03/27/14   Donita Brooks, NP  morphine (ROXANOL) 20 MG/ML concentrated solution Take 0.25 mLs (5 mg total) by mouth every 4 (four) hours as needed for severe pain. 04/11/14   Tanda Rockers, MD  Multiple Vitamin (MULTIVITAMIN) capsule Take 1 capsule by mouth daily.      Historical Provider, MD  OXYGEN-HELIUM  IN 8+ Liters continuous inhalation    Historical Provider, MD  pantoprazole (PROTONIX) 40 MG tablet Take 40 mg by mouth daily.    Historical Provider, MD  potassium chloride SA (K-DUR,KLOR-CON) 20 MEQ tablet Take 80 mEq by mouth 2 (two) times daily.     Historical Provider, MD  predniSONE (DELTASONE) 20 MG tablet Take 1 tablet (20 mg total) by mouth daily with breakfast. 02/13/14   Collene Gobble, MD  rivaroxaban (XARELTO) 20 MG TABS tablet Take 1 tablet (20 mg total) by mouth daily. 04/06/14   Collene Gobble, MD  senna-docusate (SENOKOT-S) 8.6-50 MG per tablet Take 1 tablet by mouth 2 (two) times daily as needed for mild constipation. 04/06/14   Collene Gobble, MD  simvastatin (ZOCOR) 20 MG tablet TAKE 1 TABLET DAILY AT BEDTIME.    Leonie Man, MD  tiotropium (SPIRIVA HANDIHALER) 18 MCG inhalation capsule PLACE 1 CAPSULE INTO INHALER AND INHALE INTO THE LUNGS ONCE DAILY 12/18/13   Collene Gobble, MD  TYVASO REFILL 0.6 MG/ML SOLN USE UP TO 9 BREATHS 4 TIMES A DAY WITH TYVASO UNIT. LOAD DRUG INTO NEBULIZER BEFORE 1ST USE OF DAY. 04/09/14   Collene Gobble, MD  ULORIC 40 MG tablet Take 1 tablet by mouth daily. 12/11/13   Historical Provider, MD   SpO2 77% Physical Exam  Constitutional: He is oriented to person, place, and time. Vital signs are normal. He appears well-developed and well-nourished.  Non-toxic appearance. He does not appear ill. He appears distressed.  Distressed  HENT:  Head: Normocephalic and atraumatic.  Nose: Nose normal.  Mouth/Throat: Oropharynx is clear and moist. No oropharyngeal exudate.  Eyes: Conjunctivae and EOM are normal. Pupils are equal, round, and reactive to light. No scleral icterus.  Neck: Normal range of motion. Neck supple. No tracheal deviation, no edema, no erythema and normal range of motion present. No thyroid mass and no thyromegaly present.  Cardiovascular: Normal rate, regular rhythm, S1 normal, S2 normal, normal heart sounds, intact distal pulses and  normal pulses.  Exam reveals no gallop and no friction rub.   No murmur heard. Pulses:      Radial pulses are 2+ on the right side, and 2+ on the left side.       Dorsalis pedis pulses are 2+ on the right side, and 2+ on the left side.  Pulmonary/Chest: He is in respiratory distress. He has no wheezes. He has no rhonchi. He has no rales.  Cap refill delayed >3 seconds Tachypnea   Abdominal: Soft. Normal appearance and bowel sounds are normal. He exhibits no distension, no ascites and no mass. There is no hepatosplenomegaly. There is no tenderness. There is no rebound, no guarding and no CVA tenderness.  Musculoskeletal: Normal range of motion. He exhibits no edema or tenderness.  Lymphadenopathy:    He has no cervical adenopathy.  Neurological: He is alert and oriented to person, place, and time. He has normal strength. No cranial nerve deficit or sensory deficit. He exhibits normal muscle tone. GCS eye subscore is 4. GCS verbal subscore is 5. GCS motor subscore is 6.  Skin: Skin is warm, dry and intact. No petechiae and no rash noted. He is not diaphoretic. No erythema. No pallor.  Skin appeared cyanotic   Psychiatric: He has a normal mood and affect. His behavior is normal. Judgment normal.  Nursing note and vitals reviewed.   ED Course  Procedures (including critical care time)  DIAGNOSTIC STUDIES: Oxygen Saturation is 77% on [place], low by my interpretation.    COORDINATION OF CARE: 2:50 AM Discussed treatment plan with pt at bedside and pt agreed to plan.   Labs Review Labs Reviewed  CBC - Abnormal; Notable for the following:    WBC 13.0 (*)    RBC 6.10 (*)    Hemoglobin 19.9 (*)    HCT 56.2 (*)    Platelets 115 (*)    All other components within normal limits  COMPREHENSIVE METABOLIC PANEL - Abnormal; Notable for the following:    Sodium 122 (*)    Potassium 3.2 (*)    Chloride 72 (*)    CO2 33 (*)    Glucose, Bld 122 (*)    Calcium 7.7 (*)    Total Bilirubin 2.2 (*)     GFR calc non Af Amer 62 (*)    GFR calc Af Amer 72 (*)    Anion gap 17 (*)    All other components within normal limits  BRAIN NATRIURETIC PEPTIDE - Abnormal; Notable for the following:    B Natriuretic Peptide 850.4 (*)    All other components within normal limits  PROTIME-INR - Abnormal; Notable for the following:    Prothrombin Time 36.6 (*)    INR 3.65 (*)    All other components within normal limits  APTT - Abnormal; Notable for the following:    aPTT 52 (*)    All other components within normal limits  I-STAT ARTERIAL BLOOD GAS, ED - Abnormal; Notable for the following:    pH, Arterial 7.483 (*)    pO2, Arterial 75.0 (*)    Bicarbonate 31.9 (*)    Acid-Base Excess 7.0 (*)    All other components within normal limits  CULTURE, BLOOD (ROUTINE X 2)  CULTURE, BLOOD (ROUTINE X 2)  CULTURE, EXPECTORATED SPUTUM-ASSESSMENT  GRAM STAIN  RESPIRATORY VIRUS PANEL  LEGIONELLA ANTIGEN, URINE  STREP PNEUMONIAE URINARY ANTIGEN  COMPREHENSIVE METABOLIC PANEL  CBC WITH DIFFERENTIAL  Randolm Idol, ED    Imaging Review Dg Chest Port 1 View  05/20/2014   CLINICAL DATA:  Acute onset of shortness of breath and chest pressure. Initial encounter.  EXAM: PORTABLE CHEST - 1 VIEW  COMPARISON:  Chest radiograph performed 03/21/2014  FINDINGS: The lungs are well-aerated. Mildly worsened peripheral and bibasilar airspace opacities raise concern for pneumonia, superimposed on the patient's chronic interstitial lung disease and fibrosis. There is no evidence of pleural effusion or pneumothorax.  The cardiomediastinal silhouette is enlarged. No acute osseous abnormalities are seen.  IMPRESSION: 1. Mildly worsened peripheral and bibasilar airspace opacities raise concern for pneumonia, superimposed on the patient's chronic interstitial lung disease and fibrosis. Alternatively, it could reflect interval worsening of interstitial lung disease. 2. Cardiomegaly noted.   Electronically Signed   By: Garald Balding M.D.   On: 05/19/2014 03:18     EKG Interpretation   Date/Time:  Friday May 04 2014 03:08:09 EST Ventricular Rate:  89 PR Interval:  230 QRS Duration: 96 QT Interval:  452 QTC Calculation: 549 R Axis:   129 Text Interpretation:  Sinus rhythm with 1st degree A-V block with  occasional Premature ventricular complexes and Fusion complexes Possible  Left atrial enlargement Incomplete right bundle branch block Right  ventricular hypertrophy Nonspecific ST and T wave abnormality Prolonged QT  Abnormal ECG Confirmed by Glynn Octave [phone]) on 05/08/2014  4:55:47 AM      MDM   Final diagnoses:  None    Patient presents emergency department for worsening shortness of breath. He was in severe respiratory distress on arrival, including tachypnea, increased worker breathing, use of accessory muscles, and hypoxia requiring facemask. He was 15 L O2 at home at baseline. He refused BiPAP and patient is DO NOT RESUSCITATE thus intubation was not a possibility. X-ray reveals a new pneumonia. Likely the cause of his symptoms. This is hospital acquired he was given vancomycin, cefepime, Levaquin for treatment. Patient is DO NOT RESUSCITATE, he will be admitted  to [facility] hospitalist stepdown unit for continued evaluation. Other laboratory abnormalities include alkalosis, hyponatremia, supratherapeutic INR.  I personally performed the services described in this documentation, which was scribed in my presence. The recorded information has been reviewed and is accurate.    CRITICAL CARE Performed by: Everlene Balls   Total critical care time: 40 minutes  Critical care time was exclusive of separately billable procedures and treating other patients.  Critical care was necessary to treat or prevent imminent or life-threatening deterioration.  Critical care was time spent personally by me on the following activities: development of treatment plan with patient and/or surrogate as well as  nursing, discussions with consultants, evaluation of patient's response to treatment, examination of patient, obtaining history from patient or surrogate, ordering and performing treatments and interventions, ordering and review of laboratory studies, ordering and review of radiographic studies, pulse oximetry and re-evaluation of patient's condition.   Everlene Balls, MD 05/09/2014 [phone]

## 2014-05-04 NOTE — ED Notes (Signed)
Pt is from home, pt wears 15L 02 at home always, pt reports increased SOB since 2300 last night, pt placed on 2 NRB from family at home, EMS kept it on. Hx of COPD. Pt purple upon arrival to department. MD at [facility].

## 2014-05-04 NOTE — Progress Notes (Signed)
Discussed with Dr. Lamonte Sakai and also family.  Now comfort care. D/c abx. Continue diuretics tyvaso, letaris.  Doree Barthel, MD

## 2014-05-04 NOTE — ED Notes (Signed)
Flow called to inquire about pt's bed request.

## 2014-05-04 NOTE — Progress Notes (Signed)
ANTIBIOTIC CONSULT NOTE - INITIAL  Pharmacy Consult for vancomycin Indication: rule out pneumonia  No Known Allergies  Patient Measurements: Height: 5' 8" (172.7 cm) Weight: 176 lb (79.833 kg) IBW/kg (Calculated) : 68.4  Vital Signs: Temp: 97.2 F (36.2 C) (01/15 0254) Temp Source: Temporal (01/15 0254) BP: 119/66 mmHg (01/15 0430) Pulse Rate: 83 (01/15 0430) Intake/Output from previous day: 01/14 0701 - 01/15 0700 In: 1050 [I.V.:1000; IV Piggyback:50] Out: 200 [Urine:200] Intake/Output from this shift: Total I/O In: 1050 [I.V.:1000; IV Piggyback:50] Out: 200 [Urine:200]  Labs:  Recent Labs  04/30/2014 0247  WBC 13.0*  HGB 19.9*  PLT 115*  CREATININE 1.19   Estimated Creatinine Clearance: 59.9 mL/min (by C-G formula based on Cr of 1.19).   Medical History: Past Medical History  Diagnosis Date  . Pulmonary hypertension     Echocardiogram evidence suggest 70 mmHg; by Right Heart Cath pressures are 55-60 mmHg peak  . Thrombocytopenia, unspecified   . Chronic pulmonary heart disease, unspecified     On home oxygen  . Peripheral vascular disease, unspecified     ABI is 87 on the right and 100 on the left. ; Followed by Dr. Shanon Rosser.  CTA abdomen-pelvis @ [facility] Severe aortoiliac disease; left common iliac occlusion severe right external iliac disease; severe SMA stenosis  . Other and unspecified hyperlipidemia   . Postinflammatory pulmonary fibrosis   . Other psoriasis   . Rheumatoid arthritis(714.0)   . Chronic respiratory failure   . Hypoxemia   . Chronic airway obstruction, not elsewhere classified   . Hyperlipidemia   . Perforation of colon 10/2001    After colonoscopy on 10/18/2001  . Internal hemorrhoids   . Diverticulosis   . Shortness of breath   . Chronic right-sided HF (heart failure)     Right-sided  . Headache(784.0)   . Cancer skin  . 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 CAD (coronary artery disease), native coronary artery 2012     Cath @ [facility]) --  70% prox Cx, prox RCA 30%, ~20% LM, D1.  . CHF (congestive heart failure)      Assessment: 64yo male c/o SOB x4h in setting of COPD/CHF w/ chronic 15L O2 use at home, CXR concerning for PNA, to begin IV ABX.  Goal of Therapy:  Vancomycin trough level 15-20 mcg/ml  Plan:  Rec'd vanc 1g in ED; will continue with vancomycin 759m IV Q12H and monitor CBC, Cx, levels prn.  VWynona Neat PharmD, BCPS  05/18/2014,5:07 AM

## 2014-05-04 NOTE — ED Notes (Signed)
Pt adm 0.5 mg of xanax at 0030 and 0.25 mg of morphine at 0130 per EMS per pt's hospice RN.

## 2014-05-04 NOTE — H&P (Signed)
[facility] History and Physical  [name] IEP:[id number] DOB: 1950-06-21 DOA: 05/07/2014  Referring physician: ED physician PCP: Horatio Pel, MD  Specialists:   Chief Complaint: SOB   HPI: [name] is a 64 y.o. male with COPD, CHF, ILD, diabetes mellitus, pulmonary fibrosis, rheumatoid arthritis, hyperlipidemia, right heart failure, CHF, coronary artery disease, history of DVT on Xarelto, who presents with the worsening shortness of breath.  Patient has severe pulmonary disease. He is bed-ridden. he constantly uses 15L of Oxygen at home. At about 11:30, he started feeling worsening shortness of breath. He has mild cough with very little whitish colored sputum production. No fever or chills. No chest pain. He has mild nausea, but no vomiting, diarrhea or abdominal pain. Patient denies fever, chills, abdominal pain, diarrhea, constipation, dysuria, urgency, frequency, hematuria, skin rashes, joint pain or leg swelling.  Work up in the ED demonstrates possible bilateral basilar infiltration, leukocytosis with WBC 13.0. BNP 850. Potassium is 3.2. Patient is admitted to inpatient for further evaluation and treatment.  Review of Systems: As presented in the history of presenting illness, rest negative.  Where does patient live?  At home Can patient participate in ADLs? none  Allergy: No Known Allergies  Past Medical History  Diagnosis Date  . Pulmonary hypertension     Echocardiogram evidence suggest 70 mmHg; by Right Heart Cath pressures are 55-60 mmHg peak  . Thrombocytopenia, unspecified   . Chronic pulmonary heart disease, unspecified     On home oxygen  . Peripheral vascular disease, unspecified     ABI is 87 on the right and 100 on the left. ; Followed by Dr. Shanon Rosser.  CTA abdomen-pelvis @ [facility] Severe aortoiliac disease; left common iliac occlusion severe right external iliac disease; severe SMA stenosis  . Other and unspecified hyperlipidemia   .  Postinflammatory pulmonary fibrosis   . Other psoriasis   . Rheumatoid arthritis(714.0)   . Chronic respiratory failure   . Hypoxemia   . Chronic airway obstruction, not elsewhere classified   . Hyperlipidemia   . Perforation of colon 10/2001    After colonoscopy on 10/18/2001  . Internal hemorrhoids   . Diverticulosis   . Shortness of breath   . Chronic right-sided HF (heart failure)     Right-sided  . Headache(784.0)   . Cancer skin  . 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 CAD (coronary artery disease), native coronary artery 2012    Cath @ [facility]) --  70% prox Cx, prox RCA 30%, ~20% LM, D1.  . CHF (congestive heart failure)     Past Surgical History  Procedure Laterality Date  . Repair of cecal perforation  10/2001  . Colon surgery  2003    Secondary to bowel perforation after colonoscopy  . Back surgery    . Tonsillectomy    . Doppler echocardiography  June 2012    Normal LV size and function. Grade 1 diastolic dysfunction. Mild LA pressures elevation. A Cordis septal motion. Mildly dilated RV with increased pressures. Moderate to severe TR with PA pressures estimated 70 mmHg. Dilated IVC with blunted changes equals further CP.  Marland Kitchen Persantine myoview  June 2012    No ischemia or infarction  . Left and right heart catheterization  2012    70% circumflex lesion, 20-30% RCA, Left Main, D1;  RBP 55/6 mmHg (12 mmHg), BAP 53/20 mmHg. PCWP 13/12 mmHg (10). RAP 10 mmHg. They'll mean 66 mmHg, 91/49 mmHg.  Social History:  reports that he quit smoking about 8 years ago. His smoking use included Cigarettes. He has a 84 pack-year smoking history. He has never used smokeless tobacco. He reports that he drinks about 0.6 oz of alcohol per week. He reports that he does not use illicit drugs.  Family History:  Family History  Problem Relation Age of Onset  . Heart disease Mother   . Diabetes Mother   . Diabetes Brother      Prior to Admission medications   Medication  Sig Start Date End Date Taking? Authorizing Provider  acetaminophen (TYLENOL ARTHRITIS PAIN) 650 MG CR tablet Take 1,300 mg by mouth 2 (two) times daily.    Yes Historical Provider, MD  albuterol (PROVENTIL) (2.5 MG/3ML) 0.083% nebulizer solution Take 3 mLs (2.5 mg total) by nebulization every 6 (six) hours as needed for wheezing. 06/21/12  Yes Collene Gobble, MD  ALPRAZolam Duanne Moron) 0.5 MG tablet Take 1 tablet (0.5 mg total) by mouth 3 (three) times daily as needed for anxiety or sleep (air hunger). 04/03/14  Yes Tammy S Parrett, NP  b complex vitamins tablet Take 1 tablet by mouth daily.   Yes Historical Provider, MD  calcium-vitamin D (OSCAL) 250-125 MG-UNIT per tablet Take 1 tablet by mouth 2 (two) times daily.   Yes Historical Provider, MD  cetirizine (ZYRTEC) 10 MG tablet Take 10 mg by mouth daily.     Yes Historical Provider, MD  escitalopram (LEXAPRO) 5 MG tablet Take 1 tablet (5 mg total) by mouth daily. 04/17/14  Yes Collene Gobble, MD  fluticasone (FLONASE) 50 MCG/ACT nasal spray Place 2 sprays into the nose 2 (two) times daily. 01/19/13  Yes Raylene Miyamoto, MD  folic acid (FOLVITE) 1 MG tablet Take 1 mg by mouth daily.     Yes Historical Provider, MD  furosemide (LASIX) 20 MG tablet Take 3 tablets (60 mg total) by mouth 2 (two) times daily. 03/27/14  Yes Donita Brooks, NP  glimepiride (AMARYL) 2 MG tablet Take 1 tablet (2 mg total) by mouth daily before breakfast. 01/30/13  Yes Tammy S Parrett, NP  LETAIRIS 10 MG tablet TAKE 1 TABLET (10 MG) ORALLY DAILY. DO NOT HANDLE IF PREGNANT. DO NOTSPLIT, CRUSH OR CHEW. AVOID INHALATION AND CONTACT WITH SKIN OR EYE. 01/11/14  Yes Collene Gobble, MD  metolazone (ZAROXOLYN) 5 MG tablet Take 1 tablet (5 mg total) by mouth daily. Patient taking differently: Take 5 mg by mouth every other day.  03/27/14  Yes Donita Brooks, NP  morphine (ROXANOL) 20 MG/ML concentrated solution Take 0.25 mLs (5 mg total) by mouth every 4 (four) hours as needed for severe  pain. 04/11/14  Yes Tanda Rockers, MD  Multiple Vitamin (MULTIVITAMIN) capsule Take 1 capsule by mouth daily.     Yes Historical Provider, MD  pantoprazole (PROTONIX) 40 MG tablet Take 40 mg by mouth daily.   Yes Historical Provider, MD  potassium chloride SA (K-DUR,KLOR-CON) 20 MEQ tablet Take 80 mEq by mouth 2 (two) times daily.    Yes Historical Provider, MD  predniSONE (DELTASONE) 20 MG tablet Take 1 tablet (20 mg total) by mouth daily with breakfast. 02/13/14  Yes Collene Gobble, MD  rivaroxaban (XARELTO) 20 MG TABS tablet Take 1 tablet (20 mg total) by mouth daily. 04/06/14  Yes Collene Gobble, MD  senna-docusate (SENOKOT-S) 8.6-50 MG per tablet Take 1 tablet by mouth 2 (two) times daily as needed for mild constipation. 04/06/14  Yes Herbie Baltimore  S Byrum, MD  simvastatin (ZOCOR) 20 MG tablet TAKE 1 TABLET DAILY AT BEDTIME.   Yes Leonie Man, MD  tiotropium (SPIRIVA HANDIHALER) 18 MCG inhalation capsule PLACE 1 CAPSULE INTO INHALER AND INHALE INTO THE LUNGS ONCE DAILY 12/18/13  Yes Collene Gobble, MD  TYVASO REFILL 0.6 MG/ML SOLN USE UP TO 9 BREATHS 4 TIMES A DAY WITH TYVASO UNIT. LOAD DRUG INTO NEBULIZER BEFORE 1ST USE OF DAY. 04/09/14  Yes Collene Gobble, MD  ULORIC 40 MG tablet Take 1 tablet by mouth daily. 12/11/13  Yes Historical Provider, MD  adalimumab (HUMIRA) 40 MG/0.8ML injection Inject 40 mg into the skin every 14 (fourteen) days.     Historical Provider, MD  fentaNYL (DURAGESIC - DOSED MCG/HR) 12 MCG/HR Place 1 patch (12.5 mcg total) onto the skin every 3 (three) days. 04/26/14   Melvenia Needles, NP  OXYGEN-HELIUM IN 8+ Liters continuous inhalation    Historical Provider, MD    Physical Exam: Filed Vitals:   04/23/2014 0315 05/10/2014 0330 04/27/2014 0345 05/20/2014 0400  BP: 121/70     Pulse: 82 77 92 82  Temp:      TempSrc:      Resp: _0 Height:      Weight:      SpO2: 100% 99% 92% 100%   General: Not in acute distress HEENT:       Eyes: PERRL, EOMI, no scleral icterus        ENT: No discharge from the ears and nose, no pharynx injection, no tonsillar enlargement.        Neck: No JVD, no bruit, no mass felt. Cardiac: S1/S2, RRR, No murmurs, No gallops or rubs Pulm: Severely decreased air movement bilaterally. Has fine crackles posteriorly. Abd: Soft, nondistended, nontender, no rebound pain, no organomegaly, BS present Ext: No edema bilaterally. 2+DP/PT pulse bilaterally Musculoskeletal: No joint deformities, erythema, or stiffness, ROM full Skin: No rashes.  Neuro: Alert and oriented X3, cranial nerves II-XII grossly intact, muscle strength 5/5 in all extremeties, sensation to light touch intact.  Psych: Patient is not psychotic, no suicidal or hemocidal ideation.  Labs on Admission:  Basic Metabolic Panel:  Recent Labs Lab 05/02/2014 0247  NA 122*  K 3.2*  CL 72*  CO2 33*  GLUCOSE 122*  BUN 22  CREATININE 1.19  CALCIUM 7.7*   Liver Function Tests:  Recent Labs Lab 05/09/2014 0247  AST 36  ALT 22  ALKPHOS 59  BILITOT 2.2*  PROT 6.6  ALBUMIN 4.0   No results for input(s): LIPASE, AMYLASE in the last 168 hours. No results for input(s): AMMONIA in the last 168 hours. CBC:  Recent Labs Lab 04/23/2014 0247  WBC 13.0*  HGB 19.9*  HCT 56.2*  MCV 92.1  PLT 115*   Cardiac Enzymes: No results for input(s): CKTOTAL, CKMB, CKMBINDEX, TROPONINI in the last 168 hours.  BNP (last 3 results)  Recent Labs  03/21/14 1515  PROBNP 7619.0*   CBG: No results for input(s): GLUCAP in the last 168 hours.  Radiological Exams on Admission: Dg Chest Port 1 View  05/18/2014   CLINICAL DATA:  Acute onset of shortness of breath and chest pressure. Initial encounter.  EXAM: PORTABLE CHEST - 1 VIEW  COMPARISON:  Chest radiograph performed 03/21/2014  FINDINGS: The lungs are well-aerated. Mildly worsened peripheral and bibasilar airspace opacities raise concern for pneumonia, superimposed on the patient's chronic interstitial lung disease and fibrosis. There  is no evidence of pleural effusion  or pneumothorax.  The cardiomediastinal silhouette is enlarged. No acute osseous abnormalities are seen.  IMPRESSION: 1. Mildly worsened peripheral and bibasilar airspace opacities raise concern for pneumonia, superimposed on the patient's chronic interstitial lung disease and fibrosis. Alternatively, it could reflect interval worsening of interstitial lung disease. 2. Cardiomegaly noted.   Electronically Signed   By: Garald Balding M.D.   On: 05/10/2014 03:18    EKG: Independently reviewed.   Assessment/Plan Principal Problem:   HCAP (healthcare-associated pneumonia) Active Problems:   HLD (hyperlipidemia)   PULMONARY FIBROSIS   Chronic right-sided HF (heart failure)   Pulmonary hypertension   CAD - moderate by cath March 2012   Hypokalemia   Chronic anticoagulation- Xarelto (hx of DVT)   DM (diabetes mellitus) type II uncontrolled with eye manifestation   Acute on chronic respiratory failure  HCAP and acute on chronic respiratory failure: Patient's worsening shortness of breath is most likely caused by HCAP as evidenced by chest x-ray. Pulmonary embolism is very unlikely given that patient is on Xarelto.  - will admit to SDU - treat with Vancomycin and cefepime  - urine legionella and S. pneumococcal antigen - IVF: 100 cc/h - continue home inhalers - give Zofran for Nausea - continue home dose of prednisone: 20 mg daily - follow up blood culture x2, sputum culture and her respiratory events panel - patient refused BiPAP. He is DNR.  CHF and R hear failure: 2-D echo 07/18/12 showed a year 60-65% with grade 1 diastolic dysfunction. BNP is elevated at 850, but patient does not have leg edema on admission. It seems that his volume status is okay.  -will continue home diuretics: Lasix, metolazone  Diabetes mellitus: A1c was 7.6 on 07/25/12. Patient is on Amaryl at home. -Switched to sliding scale insulin  Hypokalemia: Potassium 3.2 -We'll  replete  History of DVT: On Xarelto, no bleeding tendency. -Continue Xarelto   DVT ppx: SCD, on Xarelto Code Status: DNR Family Communication:   Yes, patient's   wife    at bed side Disposition Plan: Admit to inpatient   Date of Service 05/08/2014    Ivor Costa [facility] Pager [phone]  If 7PM-7AM, please contact night-coverage [email or web address] Password [email or web address] 05/17/2014, 4:09 AM

## 2014-05-04 NOTE — Progress Notes (Signed)
Chart reviewed.  Pt is a hospice patient. Appears that complete comfort measures with fentanyl gtt would be appropriate.  Will change to medsurg. Discussed with Dr. Lamonte Sakai who agrees and may come to talk with patient and family. None available currently. Apparently, son is coming from [place]. Will discuss recommendations with family and patient when they arrive. Continue abx for now. Minimize other PO meds as able, as pt too dyspneic to take most.  Doree Barthel, MD [facility] [email or web address] [phone]

## 2014-05-05 DIAGNOSIS — I270 Primary pulmonary hypertension: Secondary | ICD-10-CM

## 2014-05-05 DIAGNOSIS — J849 Interstitial pulmonary disease, unspecified: Secondary | ICD-10-CM

## 2014-05-05 DIAGNOSIS — J9621 Acute and chronic respiratory failure with hypoxia: Secondary | ICD-10-CM

## 2014-05-05 DIAGNOSIS — J8410 Pulmonary fibrosis, unspecified: Principal | ICD-10-CM

## 2014-05-05 MED ORDER — ONDANSETRON HCL 4 MG/2ML IJ SOLN
4.0000 mg | Freq: Four times a day (QID) | INTRAMUSCULAR | Status: DC | PRN
  Administered 2014-05-05: 4 mg via INTRAVENOUS
  Filled 2014-05-05: qty 2

## 2014-05-07 ENCOUNTER — Ambulatory Visit: Payer: Medicare Other | Admitting: Emergency Medicine

## 2014-05-07 LAB — LEGIONELLA ANTIGEN, URINE

## 2014-05-08 ENCOUNTER — Ambulatory Visit: Payer: BC Managed Care – PPO | Admitting: Emergency Medicine

## 2014-05-10 LAB — CULTURE, BLOOD (ROUTINE X 2)
CULTURE: NO GROWTH
Culture: NO GROWTH

## 2014-05-21 NOTE — Progress Notes (Signed)
Pt presents with low BP and decrease 02. Conley Canal MD made aware. No changes to occur at this moment. Pt on comfort care. Will continue to monitor.

## 2014-05-21 NOTE — Discharge Summary (Signed)
Death Summary  [name] QAS:[id number] DOB: Apr 27, 1950 DOA: May 09, 2014  PCP: Horatio Pel, MD  Admit date: May 09, 2014 Date of Death: May 10, 2014 Time of death:   Cause of death:    PULMONARY FIBROSIS  Other diagnosis   Pulmonary hypertension HLD (hyperlipidemia)  Chronic right-sided HF (heart failure)  CAD - moderate by cath March 2012   Hypokalemia   DM (diabetes mellitus) type II uncontrolled with eye manifestation   Acute on chronic respiratory failure  Interstitial lung disease possible pneumonia   History of present illness/hospital course 64 y.o. male with COPD, CHF, ILD, diabetes mellitus, pulmonary fibrosis, rheumatoid arthritis, hyperlipidemia, right heart failure, CHF, coronary artery disease, history of DVT on Xarelto, who presents with the worsening shortness of breath.  Followed by hospice at home.  Work up in the ED demonstrates possible bilateral basilar infiltration, leukocytosis with WBC 13.0. BNP 850. Potassium is 3.2. Initially started on antibiotics.  After discussion with patients pulmonologist, Dr. Lamonte Sakai, patient was started on morphine gtt and all non-comfort medications stopped. Patient subsequently expired.  SignedDelfina Redwood  [facility] May 10, 2014, 6:44 PM

## 2014-05-21 NOTE — Plan of Care (Signed)
Problem: Phase I Progression Outcomes Goal: Dyspnea controlled at rest Outcome: Progressing Pt dependent on 15L non rebreather at home.  Goal: Pain controlled with appropriate interventions Outcome: Completed/Met Date Met:  05/23/2014 Pt is on morphine drip  Goal: Code status addressed with pt/family Outcome: Completed/Met Date Met:  May 23, 2014 Pt DNR, comfort care.  Goal: Voiding-avoid urinary catheter unless indicated Outcome: Completed/Met Date Met:  05/23/2014 Pt is comfort care, foley for End of Life care

## 2014-05-21 NOTE — Progress Notes (Signed)
[facility] PROGRESS NOTE  [name] GMW:[id number] DOB: 07/23/1950 DOA: 05/13/2014 PCP: Horatio Pel, MD  Assessment/Plan:  Principal Problem: End stage ILD/pulmonary fibrosis  Continue morphine gtt. Likely hours to 1-2 days prognosis.  HPI/Subjective: unable  Objective: Filed Vitals:   05-12-2014 1442  BP: 70/49  Pulse:   Temp: 98.1 F (36.7 C)  Resp: 7    Intake/Output Summary (Last 24 hours) at 05/12/2014 1729 Last data filed at 12-May-2014 0502  Gross per 24 hour  Intake      0 ml  Output   1050 ml  Net  -1050 ml   Filed Weights   04/20/2014 0254  Weight: 79.833 kg (176 lb)    Exam:   General:  Eyes closed. Cyanotic. Appears comfortable  Cardiovascular: RRR  Respiratory: CTA  Abdomen: S, NT  Ext: cold and cyanotic  Basic Metabolic Panel:  Recent Labs Lab 04/24/2014 0247 05/13/2014 0546  NA 122* 121*  K 3.2* 2.9*  CL 72* 76*  CO2 33* 30  GLUCOSE 122* 166*  BUN 22 20  CREATININE 1.19 1.13  CALCIUM 7.7* 6.9*   Liver Function Tests:  Recent Labs Lab 05/03/2014 0247 05/09/2014 0546  AST 36 31  ALT 22 19  ALKPHOS 59 52  BILITOT 2.2* 2.0*  PROT 6.6 5.7*  ALBUMIN 4.0 3.3*   No results for input(s): LIPASE, AMYLASE in the last 168 hours. No results for input(s): AMMONIA in the last 168 hours. CBC:  Recent Labs Lab 04/22/2014 0247 05/20/2014 0546  WBC 13.0* 12.5*  NEUTROABS  --  10.1*  HGB 19.9* 19.0*  HCT 56.2* 54.2*  MCV 92.1 93.6  PLT 115* 108*   Cardiac Enzymes: No results for input(s): CKTOTAL, CKMB, CKMBINDEX, TROPONINI in the last 168 hours. BNP (last 3 results)  Recent Labs  03/21/14 1515  PROBNP 7619.0*   CBG: No results for input(s): GLUCAP in the last 168 hours.  Recent Results (from the past 240 hour(s))  Culture, blood (routine x 2)     Status: None (Preliminary result)   Collection Time: 05/06/2014  3:55 AM  Result Value Ref Range Status   Specimen Description BLOOD RIGHT ARM  Final   Special  Requests BOTTLES DRAWN AEROBIC AND ANAEROBIC 10CC EACH  Final   Culture   Final           BLOOD CULTURE RECEIVED NO GROWTH TO DATE CULTURE Derek BE HELD FOR 5 DAYS BEFORE ISSUING A FINAL NEGATIVE REPORT Note: Culture results may be compromised due to an excessive volume of blood received in culture bottles. Performed at [facility]    Report Status PENDING  Incomplete  Culture, blood (routine x 2)     Status: None (Preliminary result)   Collection Time: 04/27/2014  4:00 AM  Result Value Ref Range Status   Specimen Description BLOOD RIGHT HAND  Final   Special Requests BOTTLES DRAWN AEROBIC ONLY 10CC  Final   Culture   Final           BLOOD CULTURE RECEIVED NO GROWTH TO DATE CULTURE Derek BE HELD FOR 5 DAYS BEFORE ISSUING A FINAL NEGATIVE REPORT Note: Culture results may be compromised due to an excessive volume of blood received in culture bottles. Performed at [facility]    Report Status PENDING  Incomplete     Studies: Dg Chest Port 1 View  05/03/2014   CLINICAL DATA:  Acute onset of shortness of breath and chest pressure. Initial encounter.  EXAM: PORTABLE CHEST -  1 VIEW  COMPARISON:  Chest radiograph performed 03/21/2014  FINDINGS: The lungs are well-aerated. Mildly worsened peripheral and bibasilar airspace opacities raise concern for pneumonia, superimposed on the patient's chronic interstitial lung disease and fibrosis. There is no evidence of pleural effusion or pneumothorax.  The cardiomediastinal silhouette is enlarged. No acute osseous abnormalities are seen.  IMPRESSION: 1. Mildly worsened peripheral and bibasilar airspace opacities raise concern for pneumonia, superimposed on the patient's chronic interstitial lung disease and fibrosis. Alternatively, it could reflect interval worsening of interstitial lung disease. 2. Cardiomegaly noted.   Electronically Signed   By: Garald Balding M.D.   On: 04/24/2014 03:18    Scheduled Meds: . ambrisentan  10 mg Oral Daily   . fentaNYL  12.5 mcg Transdermal Q72H  . fentaNYL  12.5 mcg Transdermal Q72H  . furosemide  60 mg Oral BID  . metolazone  5 mg Oral Daily  . predniSONE  20 mg Oral Q breakfast  . tiotropium  18 mcg Inhalation Daily  . Treprostinil  18 mcg Inhalation QID   Continuous Infusions: . morphine 2.5 mg/hr (June 02, 2014 0151)    Time spent: 15 minutes  [facility] [email or web address], password [facility] 2014-06-02, 5:29 PM  LOS: 1 day

## 2014-05-21 DEATH — deceased

## 2015-01-05 IMAGING — CR DG CHEST 1V PORT
1 series · 1 of 1 positions shown · non-contrast
Comparison: 01/14/2013 and CT of 09/29/2010.

CLINICAL DATA: Worsening shortness of breath. Evaluate for
pneumonia.

EXAM:
PORTABLE CHEST - 1 VIEW

[AP]
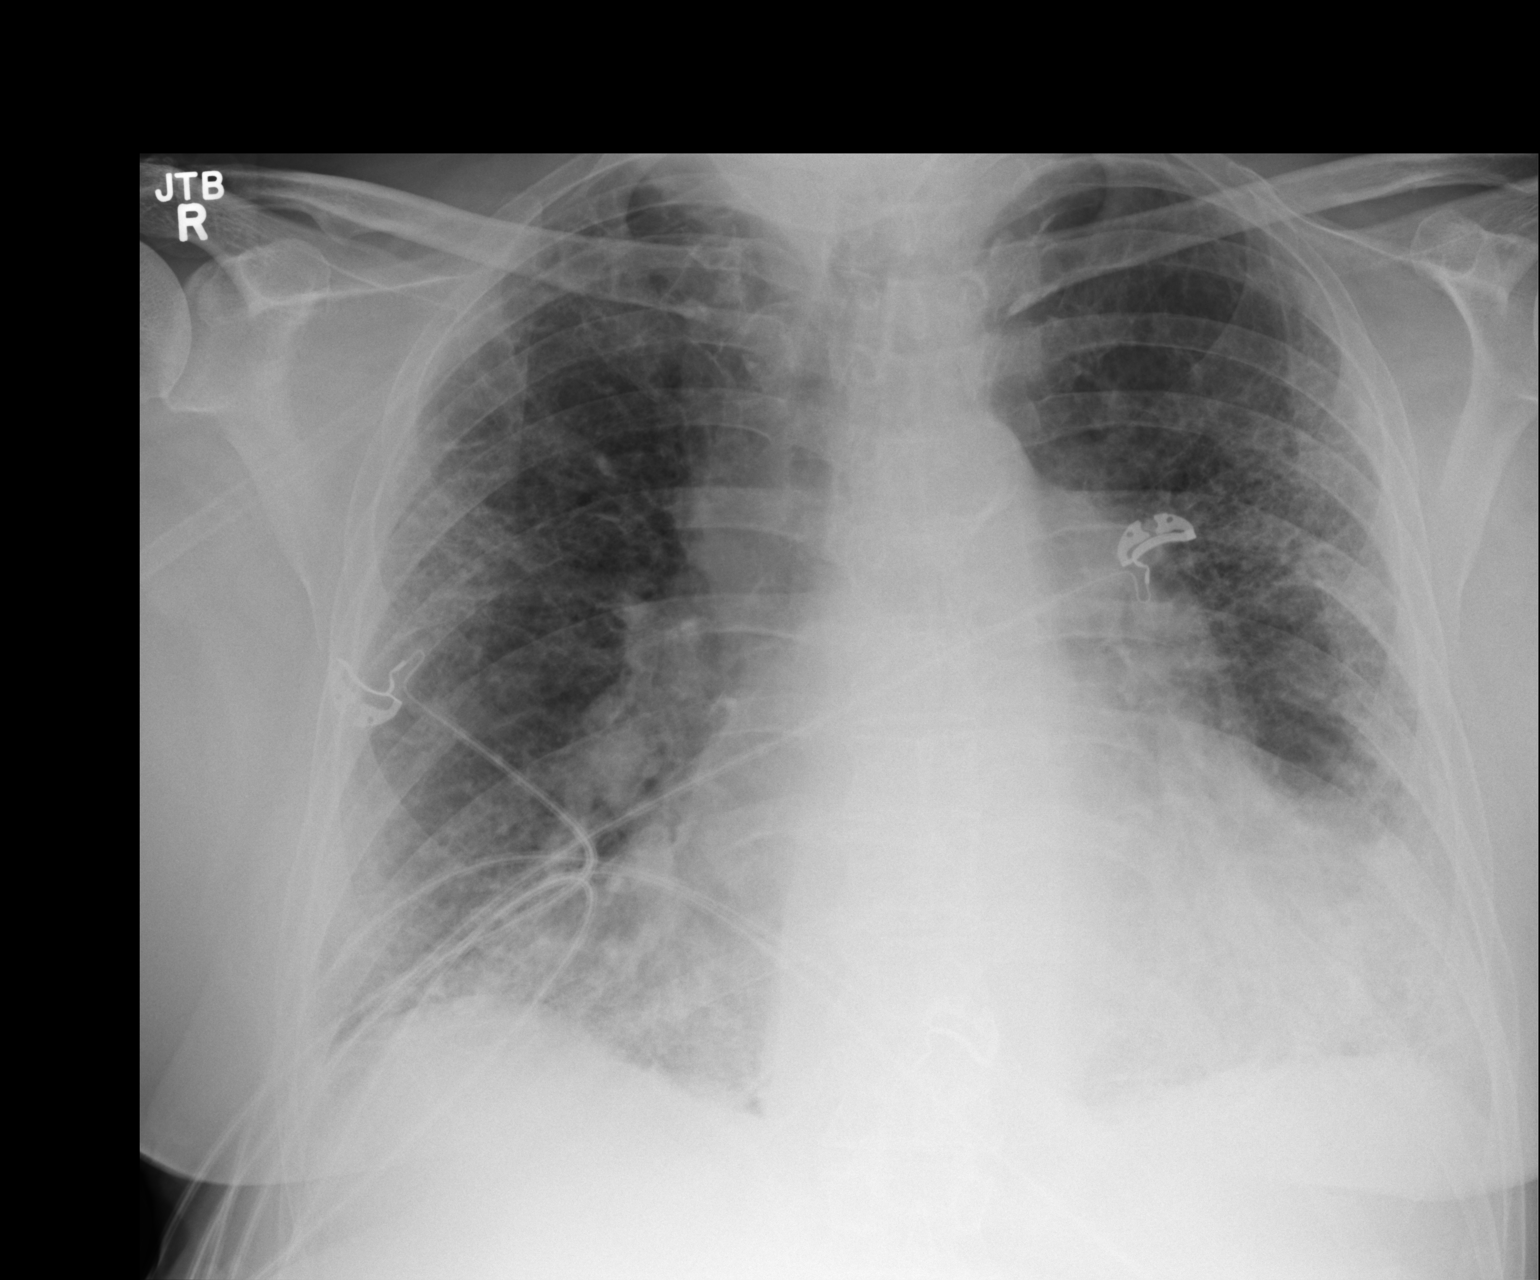

[1 of 1 positions shown; findings below may reference images not displayed]

FINDINGS: Midline trachea. Moderate cardiomegaly. Pulmonary artery
enlargement. Apparent right paratracheal soft tissue fullness may
correspond to adenopathy on the remote CT. No definite pleural
fluid. No pneumothorax. Interstitial lung disease which is moderate.
No convincing evidence of acute superimposed process.
IMPRESSION: Interstitial lung disease, likely pulmonary fibrosis. No convincing
evidence of acute superimposed process.

Cardiomegaly with suspicion of right mediastinal adenopathy.
Consider further evaluation with chest CT. Of note, adenopathy is
present on the 09/29/2010 CT.

Pulmonary artery enlargement suggests pulmonary arterial
hypertension.

## 2018-06-19 DEATH — deceased
